# Patient Record
Sex: Male | Born: 1960 | Race: Black or African American | Hispanic: No | Smoking: Former smoker
Health system: Southern US, Community
[De-identification: ages and names within clinical notes are randomized; demographics above are authoritative.]

## PROBLEM LIST (undated history)

## (undated) DIAGNOSIS — F32A Depression, unspecified: Secondary | ICD-10-CM

## (undated) DIAGNOSIS — I2510 Atherosclerotic heart disease of native coronary artery without angina pectoris: Secondary | ICD-10-CM

## (undated) DIAGNOSIS — F329 Major depressive disorder, single episode, unspecified: Secondary | ICD-10-CM

## (undated) DIAGNOSIS — F319 Bipolar disorder, unspecified: Secondary | ICD-10-CM

## (undated) DIAGNOSIS — I639 Cerebral infarction, unspecified: Secondary | ICD-10-CM

## (undated) DIAGNOSIS — F0390 Unspecified dementia without behavioral disturbance: Secondary | ICD-10-CM

## (undated) DIAGNOSIS — I10 Essential (primary) hypertension: Secondary | ICD-10-CM

## (undated) DIAGNOSIS — T7840XA Allergy, unspecified, initial encounter: Secondary | ICD-10-CM

## (undated) DIAGNOSIS — M1990 Unspecified osteoarthritis, unspecified site: Secondary | ICD-10-CM

## (undated) DIAGNOSIS — E785 Hyperlipidemia, unspecified: Secondary | ICD-10-CM

## (undated) HISTORY — DX: Hyperlipidemia, unspecified: E78.5

## (undated) HISTORY — DX: Allergy, unspecified, initial encounter: T78.40XA

## (undated) HISTORY — DX: Major depressive disorder, single episode, unspecified: F32.9

## (undated) HISTORY — PX: HERNIA REPAIR: SHX51

## (undated) HISTORY — PX: TONSILLECTOMY: SUR1361

## (undated) HISTORY — DX: Depression, unspecified: F32.A

## (undated) HISTORY — DX: Bipolar disorder, unspecified: F31.9

---
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
## 1982-09-17 HISTORY — PX: OTHER SURGICAL HISTORY: SHX169

## 2001-12-17 ENCOUNTER — Encounter: Payer: Self-pay | Admitting: General Surgery

## 2001-12-17 ENCOUNTER — Ambulatory Visit (HOSPITAL_COMMUNITY): Admission: RE | Admit: 2001-12-17 | Discharge: 2001-12-17 | Payer: Self-pay | Admitting: General Surgery

## 2007-12-23 ENCOUNTER — Inpatient Hospital Stay (HOSPITAL_COMMUNITY): Admission: EM | Admit: 2007-12-23 | Discharge: 2007-12-24 | Payer: Self-pay | Admitting: Psychiatry

## 2007-12-23 ENCOUNTER — Ambulatory Visit: Payer: Self-pay | Admitting: Psychiatry

## 2007-12-23 ENCOUNTER — Emergency Department (HOSPITAL_COMMUNITY): Admission: EM | Admit: 2007-12-23 | Discharge: 2007-12-23 | Payer: Self-pay | Admitting: Emergency Medicine
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
## 2011-02-02 NOTE — Op Note (Signed)
[facility]  Patient:    [name] Visit Number: [id number] MRN: [id number]          Service Type: Attending:  Lorne Skeens. Hoxworth, M.D. Dictated by:   Lorne Skeens. Hoxworth, M.D. Proc. Date: 12/17/01                             Operative Report  PREOPERATIVE DIAGNOSIS:  Bilateral inguinal herniae.  POSTOPERATIVE DIAGNOSIS:  Bilateral inguinal herniae.  PROCEDURE:  Laparoscopic repair of bilateral inguinal herniae.  SURGEON:  Lorne Skeens. Hoxworth, M.D.  ANESTHESIA:  General.  BRIEF HISTORY:  [name] is a 50 year old black male who presents with longstanding gradually enlarging bilateral inguinal herniae that are symptomatic. Exam has confirmed bilateral reducible herniae. Options for repair have been discussed and we elected to proceed with laparoscopic bilateral repair. The nature of the procedure, its indications, risks of bleeding, infection, and recurrence were discussed and understood. He is now brought to the operating room for this procedure.  DESCRIPTION OF PROCEDURE:  The patient was brought to the operating room and placed in the supine position the operating room table and general endotracheal anesthesia was induced. Foley catheter was placed. PAS were placed. He was given preoperative antibiotics. The abdomen was sterilely prepped and draped. A 1-cm incision was made at the umbilicus and dissection was carried down to the midline fascia. This was incised transversely from 1 cm and the medial edge of the right rectus muscle retracted laterally, the preperitoneal space entered bluntly. The balloon dissector was passed in this space and its tip positioned at the pubis and it was inflated with good bilateral deployment of the balloons out toward the iliac crest. It was left in place for several minutes then removed and this structural balloon placed and CO2 pressure applied. Video laparoscopy showed pretty good  bilateral dissection. Dissection was initially begun on the right. The pubic symphysis was identified and the Coopers ligament cleared down to the iliac vessels, which were carefully protected. Laterally, the peritoneal edge was identified and was dissected posteriorly well out to the iliac crest. The peritoneum was then dissected posteriorly working lateral to medial and cord structures were identified and were skeletonized and isolated and the peritoneum stripped down off the cord structures. There was no indirect component. The epigastric vessels were identified and just medial to the epigastric vessels was a large direct hernia with a good deal of preperitoneal fat up into the hernia which was stripped down posteriorly and the pseudosac identified and completely cleared and the entire anterior abdominal wall cleared of preperitoneal fat and the hernia defect well delineated on all sides. This was a large direct hernia. Following the third dissection on the right side, the left side was dissected in identical fashion with identical findings. A pneumoperitoneum was noted at about this point and a small opening was found in the peritoneum on the right side in the extreme lateral area. This was closed nicely with several 5 mm clips. The pneumoperitoneum was evacuated with the Veress needle in the left upper quadrant. Following this, a Bard laparoscopic inguinal hernia mesh, size large, was placed on the left and positioned. It was tacked initially to the pubic symphysis and then along Coopers ligament down to avoiding the iliac vessels. The mesh was then tacked anteriorly along the midline and superiorly along its edge avoiding the epigastric vessels and well out laterally toward the iliac crest with all tacks being able  to be felt through the anterior abdominal wall at the tip of the applier. This provided very nice broad coverage of the direct and indirect spaces. Following this,  an identical piece of mesh, right-sided, was placed on the right and tacked in identical fashion. Both areas appeared to have nice wide coverage. CO2 was evacuated and trocars removed. The fascial defect was closed with figure-of-eight suture of 0 Vicryl. Skin incisions were closed with interrupted subcuticular of 4-0 Monocryl and Steri-Strips. Sponge, needle, and instrument counts are correct. Dry sterile dressings were applied and the patient was taken to recovery in good condition. Dictated by:   Lorne Skeens. Hoxworth, M.D. Attending:  Lorne Skeens. Hoxworth, M.D. DD:  12/17/01 TD:  12/18/01 Job: [id number]
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
## 2011-05-09 LAB — LIPID PANEL: Cholesterol: 228 mg/dL — AB (ref 0–200)

## 2011-05-09 LAB — BASIC METABOLIC PANEL
Creatinine: 1.2 mg/dL (ref 0.6–1.3)
Potassium: 4.5 mmol/L (ref 3.4–5.3)
Sodium: 137 mmol/L (ref 137–147)

## 2011-05-09 LAB — CBC AND DIFFERENTIAL
HCT: 39 % — AB (ref 41–53)
Neutrophils Absolute: 5 /uL
Platelets: 178 10*3/uL (ref 150–399)

## 2011-06-12 LAB — DIFFERENTIAL
Basophils Absolute: 0
Basophils Relative: 0
Eosinophils Absolute: 0.2
Eosinophils Relative: 2
Lymphocytes Relative: 22
Lymphs Abs: 2.2
Monocytes Absolute: 0.5
Monocytes Relative: 5
Neutro Abs: 7
Neutrophils Relative %: 71

## 2011-06-12 LAB — RAPID URINE DRUG SCREEN, HOSP PERFORMED
Amphetamines: NOT DETECTED
Barbiturates: NOT DETECTED
Benzodiazepines: NOT DETECTED
Cocaine: NOT DETECTED
Opiates: NOT DETECTED
Tetrahydrocannabinol: NOT DETECTED

## 2011-06-12 LAB — HEPATIC FUNCTION PANEL
ALT: 18
AST: 19
Albumin: 3.5
Alkaline Phosphatase: 65
Total Bilirubin: 0.6
Total Protein: 6.8

## 2011-06-12 LAB — BASIC METABOLIC PANEL
BUN: 4 — ABNORMAL LOW
CO2: 25
Calcium: 9.2
Chloride: 104
Creatinine, Ser: 0.92
GFR calc Af Amer: >60
GFR calc non Af Amer: >60
Glucose, Bld: 108 — ABNORMAL HIGH
Potassium: 3.6
Sodium: 134 — ABNORMAL LOW

## 2011-06-12 LAB — CBC
HCT: 45.9
Hemoglobin: 15.2
MCHC: 33.1
MCV: 73.8 — ABNORMAL LOW
Platelets: 220
RBC: 6.22 — ABNORMAL HIGH
RDW: 16.6 — ABNORMAL HIGH
WBC: 9.9

## 2011-06-12 LAB — LITHIUM LEVEL: Lithium Lvl: <0.25 — ABNORMAL LOW

## 2011-06-12 LAB — ETHANOL: Alcohol, Ethyl (B): <5

## 2011-10-20 LAB — BASIC METABOLIC PANEL
Glucose: 83 mg/dL
Sodium: 136 mmol/L — AB (ref 137–147)

## 2012-10-28 LAB — LIPID PANEL
Cholesterol: 240 mg/dL — AB (ref 0–200)
LDL Cholesterol: 142 mg/dL
Triglycerides: 141 mg/dL (ref 40–160)

## 2012-10-28 LAB — TSH: TSH: 2.23 u[IU]/mL (ref 0.41–5.90)

## 2012-11-11 ENCOUNTER — Encounter: Payer: Self-pay | Admitting: Internal Medicine

## 2012-11-28 ENCOUNTER — Ambulatory Visit (AMBULATORY_SURGERY_CENTER): Payer: BC Managed Care – PPO | Admitting: *Deleted

## 2012-11-28 ENCOUNTER — Encounter: Payer: Self-pay | Admitting: Internal Medicine

## 2012-11-28 VITALS — Ht 69.0 in | Wt 204.4 lb

## 2012-11-28 DIAGNOSIS — Z1211 Encounter for screening for malignant neoplasm of colon: Secondary | ICD-10-CM

## 2012-11-28 MED ORDER — MOVIPREP 100 G PO SOLR: ORAL | Status: DC

## 2012-12-19 ENCOUNTER — Encounter: Payer: Self-pay | Admitting: Internal Medicine

## 2012-12-19 ENCOUNTER — Ambulatory Visit (AMBULATORY_SURGERY_CENTER): Payer: BC Managed Care – PPO | Admitting: Internal Medicine

## 2012-12-19 VITALS — BP 146/84 | HR 50 | Temp 97.7°F | Resp 24 | Ht 69.0 in | Wt 204.0 lb

## 2012-12-19 DIAGNOSIS — Z1211 Encounter for screening for malignant neoplasm of colon: Secondary | ICD-10-CM

## 2012-12-19 MED ORDER — SODIUM CHLORIDE 0.9 % IV SOLN: 500.0000 mL | INTRAVENOUS | Status: DC

## 2012-12-19 NOTE — Patient Instructions (Addendum)

## 2012-12-19 NOTE — Progress Notes (Signed)
Report to pacu rn, vss, bbs=clear 

## 2012-12-22 ENCOUNTER — Telehealth: Payer: Self-pay | Admitting: *Deleted

## 2012-12-22 NOTE — Op Note (Signed)
[facility] [address] [place], [address]   COLONOSCOPY PROCEDURE REPORT  PATIENT: [name], [name]  MR#: [id number] BIRTHDATE: 08-19-1961 , 51  yrs. old GENDER: Male ENDOSCOPIST: Roxy Cedar, MD REFERRED BY:.  [employer] PROCEDURE DATE:  12/19/2012 PROCEDURE:   Colonoscopy, screening  .Marland Kitchen ASA CLASS:   Class II INDICATIONS:average risk screening. MEDICATIONS: MAC sedation, administered by CRNA and propofol (Diprivan) 230mg  IV  DESCRIPTION OF PROCEDURE:   After the risks benefits and alternatives of the procedure were thoroughly explained, informed consent was obtained.  A digital rectal exam revealed no abnormalities of the rectum.   The LB CF-[id number]  endoscope was introduced through the anus and advanced to the cecum, which was identified by both the appendix and ileocecal valve. No adverse events experienced.   The quality of the prep was excellent, using MoviPrep  The instrument was then slowly withdrawn as the colon was fully examined.      COLON FINDINGS: Mild diverticulosis was noted in the sigmoid colon. The colon was otherwise normal.  There was no  inflammation, polyps or cancers.  Retroflexed views revealed internal hemorrhoids. The time to cecum=3 minutes 33 seconds.  Withdrawal time=10 minutes 14 seconds.  The scope was withdrawn and the procedure completed.  COMPLICATIONS: There were no complications.  ENDOSCOPIC IMPRESSION: 1.   Mild diverticulosis was noted in the sigmoid colon 2.   The colon was otherwise normal  RECOMMENDATIONS: 1. Continue current colorectal screening recommendations for "routine risk" patients with a repeat colonoscopy in 10 years.   eSigned:  Roxy Cedar, MD 12/22/2012 11:57 AM   cc: The Patient and Robert Bellow, MD

## 2012-12-22 NOTE — Telephone Encounter (Signed)
  Follow up Call-  Call back number 12/19/2012  Post procedure Call Back phone  # [phone]  Permission to leave phone message Yes     Patient questions:  Do you have a fever, pain , or abdominal swelling? no Pain Score  0 *  Have you tolerated food without any problems? yes  Have you been able to return to your normal activities? yes  Do you have any questions about your discharge instructions: Diet   no Medications  no Follow up visit  no  Do you have questions or concerns about your Care? no  Actions: * If pain score is 4 or above: No action needed, pain <4.

## 2013-06-17 ENCOUNTER — Ambulatory Visit (INDEPENDENT_AMBULATORY_CARE_PROVIDER_SITE_OTHER): Payer: BC Managed Care – PPO | Admitting: Physician Assistant

## 2013-06-17 VITALS — BP 140/78 | HR 48 | Temp 98.0°F | Resp 16 | Ht 69.0 in | Wt 208.0 lb

## 2013-06-17 DIAGNOSIS — F319 Bipolar disorder, unspecified: Secondary | ICD-10-CM | POA: Insufficient documentation

## 2013-06-17 DIAGNOSIS — L309 Dermatitis, unspecified: Secondary | ICD-10-CM | POA: Insufficient documentation

## 2013-06-17 DIAGNOSIS — L259 Unspecified contact dermatitis, unspecified cause: Secondary | ICD-10-CM

## 2013-06-17 DIAGNOSIS — E785 Hyperlipidemia, unspecified: Secondary | ICD-10-CM | POA: Insufficient documentation

## 2013-06-17 DIAGNOSIS — F329 Major depressive disorder, single episode, unspecified: Secondary | ICD-10-CM

## 2013-06-17 DIAGNOSIS — Z23 Encounter for immunization: Secondary | ICD-10-CM

## 2013-06-17 MED ORDER — CLOBETASOL PROPIONATE 0.05 % EX OINT: TOPICAL_OINTMENT | Freq: Two times a day (BID) | CUTANEOUS | Status: DC

## 2013-06-17 MED ORDER — PREDNISONE 20 MG PO TABS: ORAL_TABLET | ORAL | Status: DC

## 2013-06-17 NOTE — Progress Notes (Signed)
  Subjective:    Patient ID: [name], male    DOB: [birth year], 52 y.o.   MRN: [id number]  HPI  [name] is a 52 year old African American male who is here for a chronic rash on feet and hands. The rash is predominately over his left foot and ankle and right lower arm. No lesions on upper legs, abdomen, chest, back, or scalp. The rash is extremely itchy and tingles/burns at time. Patient denies numbness in his extremities. Showering does not seem to make it worse. Rash is not related to temperature changes as far as the patient can tell.  No fever, chills, arthalgias, or myalgias. He has no history of asthma but does have seasonal allergies.  He has been seen at [facility] for the past year where they have tried numerous creams, including triamcinolone. These meds have worked for a little to clear up the rash but then rash and itch would return. He is currently using an OTC foot powder for the itch.  Patient also needs a TDaP for school.   Review of Systems    as above Objective:   Physical Exam  Constitutional: He appears well-developed and well-nourished. No distress.  HENT:  Head: Normocephalic and atraumatic.  Cardiovascular: Normal rate and regular rhythm.   Pulses:      Dorsalis pedis pulses are 2+ on the right side, and 2+ on the left side.       Posterior tibial pulses are 2+ on the right side, and 2+ on the left side.  Skin: Skin is dry. Rash noted. Rash is maculopapular (confluent maculopapular rash over left dorsum of the foot with lichenfication,excorations and small crust blood spots . Small round confluent rash on medial dorsum of right foot with excoriations. Area of rash on right dorsum and small patches of dry skin ). He is not diaphoretic.  No rash noted on chest, abdomen, or back. Peeling skin on plantar surface of b/l feet. Small healed scars on upper arms b/l along with discrete maculopapular rashes on lower right forearm.   Psychiatric: He has a  normal mood and affect.    BP 140/78  Pulse 48  Temp(Src) 98 F (36.7 C) (Oral)  Resp 16  Ht 5\' 9"  (1.753 m)  Wt 208 lb (94.348 kg)  BMI 30.7 kg/m2  SpO2 98%     Results for orders placed in visit on 06/17/13  GLUCOSE, POCT (MANUAL RESULT ENTRY)      Result Value Range   POC Glucose 87  70 - 99 mg/dl    Assessment & Plan:  Eczema - Plan: POCT glucose (manual entry), predniSONE (DELTASONE) 20 MG tablet, clobetasol ointment (TEMOVATE) 0.05 %  Need for Tdap vaccination - Plan: Tdap vaccine greater than or equal to 7yo IM  Follow instructions for dry skin.

## 2013-06-17 NOTE — Progress Notes (Signed)
I have examined this patient along with the student and agree.  Counseled on hygiene for dry skin, and that treatment of chronic eczema is vigilant maintenance rather than curative.  RTC 4 weeks to reassess progress.

## 2013-06-17 NOTE — Patient Instructions (Addendum)
DRY SKIN CARE: Drink at least 64 ounces of water daily. Consider a humidifier for the room where you sleep. Bathe once daily. Avoid using HOT water, as it dries skin. Avoid deodorant soaps (Dial is the worst!) and stick with gentle cleansers (I like Cetaphil Liquid Cleanser). After bathing, dry off completely, then apply a thick emollient cream (I like Cetaphil Moisturizing Cream for the body, and Dermal Therapy for the hands). Apply the cream twice daily, or more! Use the Dermal Therapy on your hands after every time you wash them!  An over-the-counter antihistamine, like Claritin, Zyrtec or Allegra, can also help reduce the itching and skin changes.

## 2013-08-03 ENCOUNTER — Ambulatory Visit (INDEPENDENT_AMBULATORY_CARE_PROVIDER_SITE_OTHER): Payer: BC Managed Care – PPO | Admitting: Physician Assistant

## 2013-08-03 ENCOUNTER — Encounter: Payer: Self-pay | Admitting: Physician Assistant

## 2013-08-03 VITALS — BP 122/78 | HR 62 | Temp 98.1°F | Resp 18 | Ht 67.75 in | Wt 213.6 lb

## 2013-08-03 DIAGNOSIS — Z23 Encounter for immunization: Secondary | ICD-10-CM

## 2013-08-03 DIAGNOSIS — L309 Dermatitis, unspecified: Secondary | ICD-10-CM

## 2013-08-03 DIAGNOSIS — K649 Unspecified hemorrhoids: Secondary | ICD-10-CM

## 2013-08-03 DIAGNOSIS — F32A Depression, unspecified: Secondary | ICD-10-CM

## 2013-08-03 DIAGNOSIS — E785 Hyperlipidemia, unspecified: Secondary | ICD-10-CM

## 2013-08-03 DIAGNOSIS — L259 Unspecified contact dermatitis, unspecified cause: Secondary | ICD-10-CM

## 2013-08-03 DIAGNOSIS — F329 Major depressive disorder, single episode, unspecified: Secondary | ICD-10-CM

## 2013-08-03 NOTE — Progress Notes (Signed)
    Subjective:    Patient ID: [name], male    DOB: 10-Dec-1960, 52 y.o.   MRN: [id number]  HPI This 52 y.o. male presents for evaluation of eczema after treatment with a prednisone taper followed by topical steroid and good skin hygiene. He reports that his hands and feet "have totally cleared up."   Additionally, he needs a Td to stay in school.  At his visit on 06/17/2013 he advised [place] he needed documentation of 3 tetanus vaccines to stay in school at [employer].  We administered a Tdap that day, and gave instructions to return in 4 weeks and 6 months for Td. Today he shows me documentation of a Tdap he received at the student health center on 06/11/2013.   He also needs a referral to general surgery for definitive treatment of long-standing hemorrhoids. Has had them for years, but now having more problems with pain during BMs.   Medications, allergies, past medical history, surgical history, family history, social history and problem list reviewed.   Review of Systems As above.    Objective:   Physical Exam  Vitals reviewed. Constitutional: He is oriented to person, place, and time. Vital signs are normal. He appears well-developed and well-nourished. He is active and cooperative. No distress.  HENT:  Head: Normocephalic and atraumatic.  Right Ear: Hearing normal.  Left Ear: Hearing normal.  Eyes: EOM are normal. Pupils are equal, round, and reactive to light.  Cardiovascular: Normal rate and regular rhythm.   Pulses:      Radial pulses are 2+ on the right side, and 2+ on the left side.  Pulmonary/Chest: Effort normal.  Lymphadenopathy:       Head (right side): No tonsillar, no preauricular, no posterior auricular and no occipital adenopathy present.       Head (left side): No tonsillar, no preauricular, no posterior auricular and no occipital adenopathy present.    He has no cervical adenopathy.       Right: No supraclavicular adenopathy present.       Left: No  supraclavicular adenopathy present.  Neurological: He is alert and oriented to person, place, and time. No sensory deficit.  Skin: Skin is warm, dry and intact. No rash noted. No cyanosis or erythema. Nails show no clubbing.  Hyperpigmented, moderately hypertrophic plaques on the dorsum of both hands and feet are significantly improved from his previous visit.  Psychiatric: He has a normal mood and affect.          Assessment & Plan:  Need for TD vaccine - Plan: Td vaccine greater than or equal to 7yo preservative free IM. He'll need to RTC for another TD dose in 6 months.  Unfortunately, the two Tdap doses so close together do not afford the needed immunity.  Hemorrhoids - Plan: Ambulatory referral to General Surgery  Eczema - continue current maintenance treatment.  Fernande Bras, PA-C Physician Assistant-Certified [facility]

## 2013-08-03 NOTE — Patient Instructions (Addendum)
Keep up the great work with your skin!  Unfortunately, the 2 Tdap vaccines so close together do no provide you with the immunity you need for the long term.  You need to return in 6 months for one more Td dose.Tetanus, Diphtheria (Td) Vaccine What You Need to Know WHY GET VACCINATED? Tetanus  and diphtheria are very serious diseases. They are rare in the [place] today, but people who do become infected often have severe complications. Td vaccine is used to protect adolescents and adults from both of these diseases. Both tetanus and diphtheria are infections caused by bacteria. Diphtheria spreads from person to person through coughing or sneezing. Tetanus-causing bacteria enter the body through cuts, scratches, or wounds. TETANUS (Lockjaw) causes painful muscle tightening and stiffness, usually all over the body.  It can lead to tightening of muscles in the head and neck so you can't open your mouth, swallow, or sometimes even breathe. Tetanus kills about 1 out of every 5 people who are infected. DIPHTHERIA can cause a thick coating to form in the back of the throat.  It can lead to breathing problems, paralysis, heart failure, and death. Before vaccines, the [place] saw as many as 200,000 cases a year of diphtheria and hundreds of cases of tetanus. Since vaccination began, cases of both diseases have dropped by about 99%. TD VACCINE Td vaccine can protect adolescents and adults from tetanus and diphtheria. Td is usually given as a booster dose every 10 years but it can also be given earlier after a severe and dirty wound or burn. Your doctor can give you more information. Td may safely be given at the same time as other vaccines. SOME PEOPLE SHOULD NOT GET THIS VACCINE  If you ever had a life-threatening allergic reaction after a dose of any tetanus or diphtheria containing vaccine, OR if you have a severe allergy to any part of this vaccine, you should not get Td. Tell your doctor if  you have any severe allergies.  Talk to your doctor if you:  have epilepsy or another nervous system problem,  had severe pain or swelling after any vaccine containing diphtheria or tetanus,  ever had Guillain Barr Syndrome (GBS),  aren't feeling well on the day the shot is scheduled. RISKS OF A VACCINE REACTION With a vaccine, like any medicine, there is a chance of side effects. These are usually mild and go away on their own. Serious side effects are also possible, but are very rare. Most people who get Td vaccine do not have any problems with it. Mild Problems  following Td (Did not interfere with activities)  Pain where the shot was given (about 8 people in 10)  Redness or swelling where the shot was given (about 1 person in 3)  Mild fever (about 1 person in 15)  Headache or Tiredness (uncommon) Moderate Problems following Td (Interfered with activities, but did not require medical attention)  Fever over 102 F (38.9 C) (rare) Severe Problems  following Td (Unable to perform usual activities; required medical attention)  Swelling, severe pain, bleeding, or redness in the arm where the shot was given (rare). Problems that could happen after any vaccine:  Brief fainting spells can happen after any medical procedure, including vaccination. Sitting or lying down for about 15 minutes can help prevent fainting, and injuries caused by a fall. Tell your doctor if you feel dizzy, or have vision changes or ringing in the ears.  Severe shoulder pain and reduced range of  motion in the arm where a shot was given can happen, very rarely, after a vaccination.  Severe allergic reactions from a vaccine are very rare, estimated at less than 1 in a million doses. If one were to occur, it would usually be within a few minutes to a few hours after the vaccination. WHAT IF THERE IS A SERIOUS REACTION? What should I look for?  Look for anything that concerns you, such as signs of a severe  allergic reaction, very high fever, or behavior changes. Signs of a severe allergic reaction can include hives, swelling of the face and throat, difficulty breathing, a fast heartbeat, dizziness, and weakness. These would usually start a few minutes to a few hours after the vaccination. What should I do?  If you think it is a severe allergic reaction or other emergency that can't wait, call 911 or get the person to the nearest hospital. Otherwise, call your doctor.  Afterward, the reaction should be reported to the Vaccine Adverse Event Reporting System (VAERS). Your doctor might file this report, or, you can do it yourself through the VAERS website or by calling 1-314-773-5805. VAERS is only for reporting reactions. They do not give medical advice. THE NATIONAL VACCINE INJURY COMPENSATION PROGRAM The National Vaccine Injury Compensation Program (VICP) is a federal program that was created to compensate people who may have been injured by certain vaccines. Persons who believe they may have been injured by a vaccine can learn about the program and about filing a claim by calling 1-610-231-4490 or visiting the [facility] website. HOW CAN I LEARN MORE?  Ask your doctor.  Contact your local or state health department.  Contact the Centers for Disease Control and Prevention (CDC):  Call (743) 502-0586 (1-800-CDC-INFO)  Visit CDC's vaccines website CDC Td Vaccine Interim VIS (10/21/12) Document Released: 07/01/2006 Document Revised: 12/29/2012 Document Reviewed: 12/24/2012 [facility] Patient Information 2014 [place], [place].

## 2013-08-11 ENCOUNTER — Ambulatory Visit (INDEPENDENT_AMBULATORY_CARE_PROVIDER_SITE_OTHER): Payer: BC Managed Care – PPO | Admitting: Surgery

## 2013-08-20 ENCOUNTER — Ambulatory Visit (INDEPENDENT_AMBULATORY_CARE_PROVIDER_SITE_OTHER): Payer: BC Managed Care – PPO | Admitting: Surgery

## 2013-08-20 ENCOUNTER — Encounter (INDEPENDENT_AMBULATORY_CARE_PROVIDER_SITE_OTHER): Payer: Self-pay

## 2013-08-20 ENCOUNTER — Encounter (INDEPENDENT_AMBULATORY_CARE_PROVIDER_SITE_OTHER): Payer: Self-pay | Admitting: Surgery

## 2013-08-20 VITALS — BP 144/84 | HR 68 | Temp 98.1°F | Resp 18 | Ht 69.0 in | Wt 208.0 lb

## 2013-08-20 DIAGNOSIS — K644 Residual hemorrhoidal skin tags: Secondary | ICD-10-CM

## 2013-08-20 MED ORDER — HYDROCORTISONE ACETATE 25 MG RE SUPP: 25.0000 mg | Freq: Two times a day (BID) | RECTAL | Status: DC
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
## 2013-09-22 ENCOUNTER — Ambulatory Visit (INDEPENDENT_AMBULATORY_CARE_PROVIDER_SITE_OTHER): Payer: BC Managed Care – PPO | Admitting: Surgery

## 2013-09-22 ENCOUNTER — Encounter (INDEPENDENT_AMBULATORY_CARE_PROVIDER_SITE_OTHER): Payer: Self-pay | Admitting: Surgery

## 2013-09-22 VITALS — BP 126/74 | HR 77 | Temp 96.7°F | Resp 14 | Ht 69.0 in | Wt 217.4 lb

## 2013-09-22 DIAGNOSIS — K644 Residual hemorrhoidal skin tags: Secondary | ICD-10-CM

## 2013-09-22 MED ORDER — DOXYCYCLINE HYCLATE 100 MG PO TABS: 100.0000 mg | ORAL_TABLET | Freq: Two times a day (BID) | ORAL | Status: DC

## 2013-09-22 MED ORDER — HYDROCORTISONE ACETATE 25 MG RE SUPP: 25.0000 mg | Freq: Two times a day (BID) | RECTAL | Status: DC

## 2013-09-22 NOTE — Progress Notes (Signed)
Subjective:     Patient ID: [name], male   DOB: 09-Apr-1961, 53 y.o.   MRN: [id number]  HPI He is here for a followup visit regarding his hemorrhoids. He reports significant relief from his discomfort. He reports a swelling has completely resolved. He did have an area opened up and drained purulence. He has had a previous fistula repair in the past  Review of Systems     Objective:   Physical Exam On exam, his hemorrhoids have totally resolved. 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 I will continue more steroid suppositories and antibiotics. He'll come back and see me should He decides he wants surgery

## 2013-10-01 ENCOUNTER — Telehealth: Payer: Self-pay

## 2013-10-01 NOTE — Telephone Encounter (Signed)
LM for rtn call- Need pt to come in for an ov. We are not the original prescribing provider.

## 2013-10-01 NOTE — Telephone Encounter (Addendum)
Patient states that he needs a refill on Pravastatin 40mg  [facility] [place].   [phone]

## 2013-10-01 NOTE — Telephone Encounter (Deleted)
LM for rtn call- Sent in 30 day supply of Pravachol. Needs ov for further refills

## 2013-10-02 ENCOUNTER — Other Ambulatory Visit: Payer: Self-pay

## 2013-10-02 DIAGNOSIS — L309 Dermatitis, unspecified: Secondary | ICD-10-CM

## 2013-10-02 MED ORDER — CLOBETASOL PROPIONATE 0.05 % EX OINT: TOPICAL_OINTMENT | Freq: Two times a day (BID) | CUTANEOUS | Status: DC

## 2013-10-02 MED ORDER — PRAVASTATIN SODIUM 40 MG PO TABS: 40.0000 mg | ORAL_TABLET | Freq: Every day | ORAL | Status: DC

## 2013-10-07 ENCOUNTER — Telehealth: Payer: Self-pay

## 2013-10-07 NOTE — Telephone Encounter (Signed)
This was the exact cream we gave him in Oct 2014.  Did his insurance change (? Maybe he does not have insurance now)?  Did he ask for brand?

## 2013-10-07 NOTE — Telephone Encounter (Signed)
PT STATES THE MEDICINE THAT WAS CALLED IN FOR HIM COST OVER $400.00 AND WOULD LIKE SOMEONE TO CALL HIM SOMETHING ELSE IN CHEAPER PLEASE CALL PT AT [phone]     [facility] [place]

## 2013-10-07 NOTE — Telephone Encounter (Signed)
Spoke with pt, the cream was over 400 dollars, Clobetasol.

## 2013-10-08 MED ORDER — BETAMETHASONE DIPROPIONATE AUG 0.05 % EX OINT: TOPICAL_OINTMENT | Freq: Two times a day (BID) | CUTANEOUS | Status: DC

## 2013-10-08 NOTE — Telephone Encounter (Signed)
Spoke with pt, his insurance has changed. He has a deductible now and he does remember paying 22 dollars for it last time. He called his insurance to verify and they agreed. He will most likely have to pay for this out of pocket. Im not sure if there is anything else cheaper. Please advise.

## 2013-10-08 NOTE — Telephone Encounter (Signed)
I have sent another one to the pharmacy to see if that might be cheaper.

## 2013-10-08 NOTE — Telephone Encounter (Signed)
Called pt, LMOM to CB with answers to questions.

## 2013-10-09 NOTE — Telephone Encounter (Signed)
Left message to return call 

## 2013-10-12 ENCOUNTER — Telehealth: Payer: Self-pay

## 2013-10-12 NOTE — Telephone Encounter (Signed)
PT WAS PRESCRIBED augmented betamethasone dipropionate (DIPROLENE) 0.05 % ointment, [facility] [place] states that they only have this rx in a cream. Please call her to authorize this change. Best# [phone] Carollee Herter(Shannon)

## 2013-10-12 NOTE — Telephone Encounter (Signed)
lmom that we called in diprolene for him and if this is not cheaper to let us know.

## 2013-10-13 NOTE — Telephone Encounter (Signed)
Call pt and make sure he is ok using the cream vs ointment.  Ok to change if pt is ok with it.  If pt prefers ointment, will have to have pharmacy transfer rx to someplace that has ointment in stock

## 2013-10-13 NOTE — Telephone Encounter (Signed)
Called pt, pt ok with cream. [facility] and gave an ok to switch.

## 2013-12-27 ENCOUNTER — Other Ambulatory Visit: Payer: Self-pay | Admitting: Physician Assistant

## 2013-12-30 ENCOUNTER — Ambulatory Visit: Payer: BC Managed Care – PPO | Admitting: Emergency Medicine

## 2013-12-30 ENCOUNTER — Other Ambulatory Visit: Payer: Self-pay | Admitting: Emergency Medicine

## 2013-12-30 VITALS — BP 142/92 | HR 60 | Temp 97.8°F | Resp 16 | Ht 67.5 in | Wt 220.0 lb

## 2013-12-30 DIAGNOSIS — R21 Rash and other nonspecific skin eruption: Secondary | ICD-10-CM

## 2013-12-30 DIAGNOSIS — Z79899 Other long term (current) drug therapy: Secondary | ICD-10-CM

## 2013-12-30 DIAGNOSIS — E782 Mixed hyperlipidemia: Secondary | ICD-10-CM

## 2013-12-30 DIAGNOSIS — F319 Bipolar disorder, unspecified: Secondary | ICD-10-CM

## 2013-12-30 MED ORDER — PRAVASTATIN SODIUM 40 MG PO TABS: 40.0000 mg | ORAL_TABLET | Freq: Every day | ORAL | Status: DC
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
## 2013-12-30 NOTE — Patient Instructions (Signed)
Lipid Profile The lipid profile is a group of tests that are often ordered together to determine risk of coronary heart disease. The tests that make up a lipid profile are tests that have been shown to be good indicators of whether someone is likely to have a heart attack or stroke caused by blockage of blood vessels (hardening of the arteries).  The lipid profile includes total cholesterol, HDL-cholesterol (often called good cholesterol), LDL-cholesterol (often called bad cholesterol), and triglycerides. Sometimes the report will include additional calculated values such as HDL/Cholesterol ratio or a risk score based on lipid profile results, age, sex, and other risk factors.  Treatment is based on your overall risk of coronary heart disease. A target LDL is identified. If your LDL is above the target value, you will be treated. Your target LDL value is:   LDL less than 100 mg/dL (4.092.59 mmol/L) if you have heart disease or diabetes.  LDL less than 130 mg/dL (8.113.37 mmol/L) if you have 2 or more risk factors.  LDL less than 160 mg/dL (9.144.14 mmol/L) if you have 0 or 1 risk factor. Ranges for normal findings may vary among different laboratories and hospitals. You should always check with your doctor after having lab work or other tests done to discuss the meaning of your test results and whether your values are considered within normal limits. MEANING OF TEST  Your caregiver will go over the test results with you and discuss the importance and meaning of your results, as well as treatment options and the need for additional tests if necessary. OBTAINING THE TEST RESULTS It is your responsibility to obtain your test results. Ask the lab or department performing the test when and how you will get your results. Document Released: 10/06/2004 Document Revised: 11/26/2011 Document Reviewed: 08/14/2008 [facility] Patient Information 2014 [place]

## 2013-12-31 LAB — COMPREHENSIVE METABOLIC PANEL
ALBUMIN: 4.4 g/dL (ref 3.5–5.2)
ALT: 17 U/L (ref 0–53)
AST: 21 U/L (ref 0–37)
Alkaline Phosphatase: 70 U/L (ref 39–117)
BUN: 8 mg/dL (ref 6–23)
CALCIUM: 9.4 mg/dL (ref 8.4–10.5)
CO2: 20 meq/L (ref 19–32)
CREATININE: 1.1 mg/dL (ref 0.50–1.35)
Chloride: 101 mEq/L (ref 96–112)
Glucose, Bld: 85 mg/dL (ref 70–99)
POTASSIUM: 4.3 meq/L (ref 3.5–5.3)
SODIUM: 136 meq/L (ref 135–145)
TOTAL PROTEIN: 7.3 g/dL (ref 6.0–8.3)
Total Bilirubin: 0.4 mg/dL (ref 0.2–1.2)

## 2013-12-31 LAB — T4, FREE: Free T4: 1.21 ng/dL (ref 0.80–1.80)

## 2013-12-31 LAB — T3 UPTAKE: T3 Uptake: 29.2 % (ref 22.5–37.0)

## 2013-12-31 LAB — LIPID PANEL
CHOL/HDL RATIO: 4.2 ratio
CHOLESTEROL: 229 mg/dL — AB (ref 0–200)
HDL: 54 mg/dL (ref >39)
LDL Cholesterol: 138 mg/dL — ABNORMAL HIGH (ref 0–99)
Triglycerides: 183 mg/dL — ABNORMAL HIGH (ref <150)
VLDL: 37 mg/dL (ref 0–40)

## 2013-12-31 LAB — LITHIUM LEVEL: Lithium Lvl: 0.5 mEq/L — ABNORMAL LOW (ref 0.80–1.40)

## 2013-12-31 LAB — TSH: TSH: 1.808 u[IU]/mL (ref 0.350–4.500)

## 2014-01-21 ENCOUNTER — Ambulatory Visit (INDEPENDENT_AMBULATORY_CARE_PROVIDER_SITE_OTHER): Payer: BC Managed Care – PPO | Admitting: Family Medicine

## 2014-01-21 ENCOUNTER — Telehealth: Payer: Self-pay | Admitting: *Deleted

## 2014-01-21 VITALS — BP 140/96 | HR 55 | Temp 97.3°F | Resp 14 | Ht 67.5 in | Wt 217.2 lb

## 2014-01-21 DIAGNOSIS — E785 Hyperlipidemia, unspecified: Secondary | ICD-10-CM

## 2014-01-21 MED ORDER — PRAVASTATIN SODIUM 80 MG PO TABS: 80.0000 mg | ORAL_TABLET | Freq: Every day | ORAL | Status: DC

## 2014-01-21 NOTE — Telephone Encounter (Signed)
Faxed prescription for pravastatin 80 mg to [facility] at [phone] on [facility] fax form and patient gave a number [phone]. Also, I faxed the form patient gave to provider to complete about his lab results. Confirmation page received, form faxed to [facility] [phone]).
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
## 2014-01-21 NOTE — Patient Instructions (Signed)
Hypercholesterolemia High Blood Cholesterol Cholesterol is a white, waxy, fat-like protein needed by your body in small amounts. The liver makes all the cholesterol you need. It is carried from the liver by the blood through the blood vessels. Deposits (plaque) may build up on blood vessel walls. This makes the arteries narrower and stiffer. Plaque increases the risk for heart attack and stroke. You cannot feel your cholesterol level even if it is very high. The only way to know is by a blood test to check your lipid (fats) levels. Once you know your cholesterol levels, you should keep a record of the test results. Work with your caregiver to to keep your levels in the desired range. WHAT THE RESULTS MEAN:  Total cholesterol is a rough measure of all the cholesterol in your blood.  LDL is the so-called bad cholesterol. This is the type that deposits cholesterol in the walls of the arteries. You want this level to be low.  HDL is the good cholesterol because it cleans the arteries and carries the LDL away. You want this level to be high.  Triglycerides are fat that the body can either burn for energy or store. High levels are closely linked to heart disease. DESIRED LEVELS:  Total cholesterol below 200.  LDL below 100 for people at risk, below 70 for very high risk.  HDL above 50 is good, above 60 is best.  Triglycerides below 150. HOW TO LOWER YOUR CHOLESTEROL:  Diet.  Choose fish or white meat chicken and turkey, roasted or baked. Limit fatty cuts of red meat, fried foods, and processed meats, such as sausage and lunch meat.  Eat lots of fresh fruits and vegetables. Choose whole grains, beans, pasta, potatoes and cereals.  Use only small amounts of olive, corn or canola oils. Avoid butter, mayonnaise, shortening or palm kernel oils. Avoid foods with trans-fats.  Use skim/nonfat milk and low-fat/nonfat yogurt and cheeses. Avoid whole milk, cream, ice cream, egg yolks and cheeses.  Healthy desserts include angel food cake, gingersnaps, animal crackers, hard candy, popsicles, and low-fat/nonfat frozen yogurt. Avoid pastries, cakes, pies and cookies.  Exercise.  A regular program helps decrease LDL and raises HDL.  Helps with weight control.  Do things that increase your activity level like gardening, walking, or taking the stairs.  Medication.  May be prescribed by your caregiver to help lowering cholesterol and the risk for heart disease.  You may need medicine even if your levels are normal if you have several risk factors. HOME CARE INSTRUCTIONS   Follow your diet and exercise programs as suggested by your caregiver.  Take medications as directed.  Have blood work done when your caregiver feels it is necessary. MAKE SURE YOU:   Understand these instructions.  Will watch your condition.  Will get help right away if you are not doing well or get worse. Document Released: 09/03/2005 Document Revised: 11/26/2011 Document Reviewed: 02/19/2007 Exit[employer] Patient Information 2014 [employer].  

## 2014-02-16 ENCOUNTER — Ambulatory Visit (INDEPENDENT_AMBULATORY_CARE_PROVIDER_SITE_OTHER): Payer: BC Managed Care – PPO | Admitting: Family Medicine

## 2014-02-16 ENCOUNTER — Encounter: Payer: Self-pay | Admitting: Family Medicine

## 2014-02-16 VITALS — BP 137/81 | HR 60 | Temp 98.0°F | Resp 16 | Ht 68.0 in | Wt 218.0 lb

## 2014-02-16 DIAGNOSIS — E785 Hyperlipidemia, unspecified: Secondary | ICD-10-CM

## 2014-02-16 LAB — LIPID PANEL
CHOL/HDL RATIO: 4.4 ratio
Cholesterol: 203 mg/dL — ABNORMAL HIGH (ref 0–200)
HDL: 46 mg/dL (ref >39)
LDL Cholesterol: 103 mg/dL — ABNORMAL HIGH (ref 0–99)
Triglycerides: 270 mg/dL — ABNORMAL HIGH (ref <150)
VLDL: 54 mg/dL — ABNORMAL HIGH (ref 0–40)

## 2014-02-16 LAB — ALT: ALT: 19 U/L (ref 0–53)

## 2014-02-16 NOTE — Progress Notes (Signed)
S:  This 53 y.o. AA male has Dyslipidemia, currently treated w/ Pravastatin. His dose was increased to 80 mg < 4 weeks ago. He is here for lipid labs. He also takes Lithium and reports dose has been unchanged for > 5 years. His last set of labs were sent to his psychiatrist in [place], [place]. He reports no myalgias or back pain with increased statin dose.  Patient Active Problem List   Diagnosis Date Noted  . External hemorrhoid, bleeding 08/20/2013  . Bipolar 1 disorder 06/17/2013  . Hyperlipidemia 06/17/2013  . Eczema 06/17/2013   Prior to Admission medications   Medication Sig Start Date End Date Taking? Authorizing Provider  doxycycline (DORYX) 100 MG DR capsule Take 100 mg by mouth 2 (two) times daily.   Yes Historical Provider, MD  lithium carbonate 300 MG capsule Take 300 mg by mouth 2 (two) times daily with a meal.   Yes Historical Provider, MD  pravastatin (PRAVACHOL) 80 MG tablet Take 1 tablet (80 mg total) by mouth daily. [facility] [phone] 01/21/14  Yes Thao P Le, DO  traZODone (DESYREL) 100 MG tablet Take 100 mg by mouth at bedtime.   Yes Historical Provider, MD   PMHx, Surg Hx, Soc and Fam Hx reviewed.  O: Filed Vitals:   02/16/14 1451  BP: 137/81  Pulse: 60  Temp: 98 F (36.7 C)  Resp: 16   GEN: In NAD; WN,WD. HENT: [place]/AT; EOMI w/ clear conj/sclerae. Otherwise unremarkable. COR: RRR. LUNGS: Unlabored resp. SKIN: W&D; intact w/o erythema or lesions. NEURO: A&O x 3; CNs intact. Nonfocal. PSYCH: Blunt affect.  A/P: Hyperlipidemia - Continue Pravastatin 80 mg pending labs. Plan: ALT, Lipid panel  Schedule CPE/ fastng labs.

## 2014-05-11 ENCOUNTER — Ambulatory Visit (INDEPENDENT_AMBULATORY_CARE_PROVIDER_SITE_OTHER): Payer: BC Managed Care – PPO | Admitting: Surgery

## 2014-05-11 VITALS — BP 156/78 | HR 61 | Temp 98.1°F | Ht 69.0 in | Wt 212.1 lb

## 2014-05-11 DIAGNOSIS — K603 Anal fistula: Secondary | ICD-10-CM

## 2014-05-11 MED ORDER — AMOXICILLIN-POT CLAVULANATE 875-125 MG PO TABS: 1.0000 | ORAL_TABLET | Freq: Two times a day (BID) | ORAL | Status: AC

## 2014-05-11 MED ORDER — HYDROCODONE-ACETAMINOPHEN 5-325 MG PO TABS: 1.0000 | ORAL_TABLET | ORAL | Status: DC | PRN

## 2014-05-11 NOTE — Progress Notes (Signed)
Subjective:     Patient ID: [name], male   DOB: 18-Dec-1960, 53 y.o.   MRN: [id number]  HPI He is here for long-term followup visit. He again is having significant perineal discomfort and occasional drainage.  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 I will be scheduling him for a hemorrhoidectomy as well as possible fistulotomy versus abscess drainage

## 2014-05-11 NOTE — Patient Instructions (Signed)
Our schedules will call you back to schedule surgery. 

## 2014-08-02 ENCOUNTER — Other Ambulatory Visit (INDEPENDENT_AMBULATORY_CARE_PROVIDER_SITE_OTHER): Payer: Self-pay | Admitting: Surgery

## 2014-08-02 MED ORDER — AMOXICILLIN-POT CLAVULANATE 875-125 MG PO TABS: 1.0000 | ORAL_TABLET | Freq: Two times a day (BID) | ORAL | Status: AC

## 2014-08-17 ENCOUNTER — Other Ambulatory Visit (INDEPENDENT_AMBULATORY_CARE_PROVIDER_SITE_OTHER): Payer: Self-pay | Admitting: Surgery

## 2014-08-17 HISTORY — PX: HEMORRHOID SURGERY: SHX153

## 2014-08-18 ENCOUNTER — Ambulatory Visit (INDEPENDENT_AMBULATORY_CARE_PROVIDER_SITE_OTHER): Payer: BC Managed Care – PPO | Admitting: Family Medicine

## 2014-08-18 ENCOUNTER — Encounter: Payer: Self-pay | Admitting: Family Medicine

## 2014-08-18 VITALS — BP 122/82 | HR 56 | Temp 97.8°F | Resp 16 | Ht 67.5 in | Wt 205.0 lb

## 2014-08-18 DIAGNOSIS — F319 Bipolar disorder, unspecified: Secondary | ICD-10-CM

## 2014-08-18 DIAGNOSIS — Z23 Encounter for immunization: Secondary | ICD-10-CM

## 2014-08-18 DIAGNOSIS — Z79899 Other long term (current) drug therapy: Secondary | ICD-10-CM

## 2014-08-18 DIAGNOSIS — Z0000 Encounter for general adult medical examination without abnormal findings: Secondary | ICD-10-CM

## 2014-08-18 DIAGNOSIS — N400 Enlarged prostate without lower urinary tract symptoms: Secondary | ICD-10-CM

## 2014-08-18 DIAGNOSIS — E785 Hyperlipidemia, unspecified: Secondary | ICD-10-CM

## 2014-08-18 LAB — LITHIUM LEVEL: Lithium Lvl: 0.6 mEq/L — ABNORMAL LOW (ref 0.80–1.40)

## 2014-08-18 LAB — POCT URINALYSIS DIPSTICK
Bilirubin, UA: NEGATIVE
Blood, UA: NEGATIVE
Glucose, UA: NEGATIVE
Ketones, UA: NEGATIVE
Leukocytes, UA: NEGATIVE
NITRITE UA: NEGATIVE
PH UA: 6
PROTEIN UA: NEGATIVE
Spec Grav, UA: <=1.005
UROBILINOGEN UA: 0.2

## 2014-08-18 LAB — COMPLETE METABOLIC PANEL WITH GFR
ALBUMIN: 4.2 g/dL (ref 3.5–5.2)
ALK PHOS: 75 U/L (ref 39–117)
ALT: 15 U/L (ref 0–53)
AST: 19 U/L (ref 0–37)
BUN: 9 mg/dL (ref 6–23)
CALCIUM: 9.4 mg/dL (ref 8.4–10.5)
CO2: 25 mEq/L (ref 19–32)
Chloride: 101 mEq/L (ref 96–112)
Creat: 1.06 mg/dL (ref 0.50–1.35)
GFR, EST NON AFRICAN AMERICAN: 79 mL/min
GFR, Est African American: >89 mL/min
Glucose, Bld: 91 mg/dL (ref 70–99)
POTASSIUM: 4.5 meq/L (ref 3.5–5.3)
Sodium: 138 mEq/L (ref 135–145)
Total Bilirubin: 0.4 mg/dL (ref 0.2–1.2)
Total Protein: 7.2 g/dL (ref 6.0–8.3)

## 2014-08-18 LAB — LIPID PANEL
CHOL/HDL RATIO: 4.2 ratio
CHOLESTEROL: 208 mg/dL — AB (ref 0–200)
HDL: 50 mg/dL (ref >39)
LDL Cholesterol: 134 mg/dL — ABNORMAL HIGH (ref 0–99)
Triglycerides: 122 mg/dL (ref <150)
VLDL: 24 mg/dL (ref 0–40)

## 2014-08-18 LAB — CBC WITH DIFFERENTIAL/PLATELET
Basophils Absolute: 0.1 10*3/uL (ref 0.0–0.1)
Basophils Relative: 1 % (ref 0–1)
Eosinophils Absolute: 0.4 10*3/uL (ref 0.0–0.7)
Eosinophils Relative: 7 % — ABNORMAL HIGH (ref 0–5)
HCT: 46.1 % (ref 39.0–52.0)
Hemoglobin: 14.5 g/dL (ref 13.0–17.0)
Lymphocytes Relative: 28 % (ref 12–46)
Lymphs Abs: 1.4 10*3/uL (ref 0.7–4.0)
MCH: 23.4 pg — AB (ref 26.0–34.0)
MCHC: 31.5 g/dL (ref 30.0–36.0)
MCV: 74.5 fL — AB (ref 78.0–100.0)
MPV: 9.9 fL (ref 9.4–12.4)
Monocytes Absolute: 0.4 10*3/uL (ref 0.1–1.0)
Monocytes Relative: 7 % (ref 3–12)
NEUTROS ABS: 2.9 10*3/uL (ref 1.7–7.7)
Neutrophils Relative %: 57 % (ref 43–77)
Platelets: 192 10*3/uL (ref 150–400)
RBC: 6.19 MIL/uL — ABNORMAL HIGH (ref 4.22–5.81)
RDW: 15.5 % (ref 11.5–15.5)
WBC: 5 10*3/uL (ref 4.0–10.5)

## 2014-08-18 LAB — THYROID PANEL WITH TSH
Free Thyroxine Index: 1.8 (ref 1.4–3.8)
T3 UPTAKE: 27 % (ref 22–35)
T4 TOTAL: 6.6 ug/dL (ref 4.5–12.0)
TSH: 0.719 u[IU]/mL (ref 0.350–4.500)

## 2014-08-18 NOTE — Patient Instructions (Addendum)
Keeping you healthy  Get these tests  Blood pressure- Have your blood pressure checked once a year by your healthcare provider.  Normal blood pressure is 120/80  Weight- Have your body mass index (BMI) calculated to screen for obesity.  BMI is a measure of body fat based on height and weight. You can also calculate your own BMI at [email or web address]/.  Cholesterol- Have your cholesterol checked every year.  Diabetes- Have your blood sugar checked regularly if you have high blood pressure, high cholesterol, have a family history of diabetes or if you are overweight.  Screening for Colon Cancer- Colonoscopy starting at age 54.  Screening may begin sooner depending on your family history and other health conditions. Follow up colonoscopy as directed by your Gastroenterologist.  Screening for Prostate Cancer- Both blood work (PSA) and a rectal exam help screen for Prostate Cancer.  Screening begins at age 54 with African-American men and at age 54 with Caucasian men.  Screening may begin sooner depending on your family history.  Take these medicines  Aspirin- One aspirin daily can help prevent Heart disease and Stroke.  Flu shot- Every fall. You received Flu vaccine today.  Tetanus- Every 10 years.  Zostavax- Once after the age of 54 to prevent Shingles.  Pneumonia shot- Once after the age of 54; if you are younger than 4765, ask your healthcare provider if you need a Pneumonia shot.  Take these steps  Don't smoke- If you do smoke, talk to your doctor about quitting.  For tips on how to quit, go to www.smokefree.gov or call 1-800-QUIT-NOW.  Be physically active- Exercise 5 days a week for at least 30 minutes.  If you are not already physically active start slow and gradually work up to 30 minutes of moderate physical activity.  Examples of moderate activity include walking briskly, mowing the yard, dancing, swimming, bicycling, etc.  Eat a healthy diet- Eat a variety of healthy food  such as fruits, vegetables, low fat milk, low fat cheese, yogurt, lean meant, poultry, fish, beans, tofu, etc. For more information go to www.thenutritionsource.org  Drink alcohol in moderation- Limit alcohol intake to less than two drinks a day. Never drink and drive.  Dentist- Brush and floss twice daily; visit your dentist twice a year.  Depression- Your emotional health is as important as your physical health. If you're feeling down, or losing interest in things you would normally enjoy please talk to your healthcare provider.  Eye exam- Visit your eye doctor every year.  Safe sex- If you may be exposed to a sexually transmitted infection, use a condom.  Seat belts- Seat belts can save your life; always wear one.  Smoke/Carbon Monoxide detectors- These detectors need to be installed on the appropriate level of your home.  Replace batteries at least once a year.  Skin cancer- When out in the sun, cover up and use sunscreen 15 SPF or higher.  Violence- If anyone is threatening you, please tell your healthcare provider.  Living Will/ Health care power of attorney- Speak with your healthcare provider and family.

## 2014-08-18 NOTE — Progress Notes (Signed)
Subjective:    Patient ID: [name], male    DOB: 10/12/1960, 53 y.o.   MRN: [id number]  HPI  This 53 y.o. AA male is here for CPE and fasting labs. Chronic medical problems include elevated lipids treated w/ a statin and Bipolar Disorder, monitored and treated by a psychiatrist. Pt has chronic joint pain involving back and knees; hx of athletic involvement when he was younger. Problems w/ hemorrhoids being addressed by Dr. Magnus Ivan, general surgeon.  HCM: IMM- Current.           CRS- 2014 (normal).           Vision- Annually.           Dental- Periodic.  Patient Active Problem List   Diagnosis Date Noted  . External hemorrhoid, bleeding 08/20/2013  . Bipolar 1 disorder 06/17/2013  . Hyperlipidemia 06/17/2013  . Eczema 06/17/2013    Prior to Admission medications   Medication Sig Start Date End Date Taking? Authorizing Provider  lithium carbonate 300 MG capsule Take 300 mg by mouth 2 (two) times daily with a meal.    Historical Provider, MD  pravastatin (PRAVACHOL) 80 MG tablet Take 1 tablet (80 mg total) by mouth daily. [facility] phone [phone] 01/21/14   Thao P Le, DO  traZODone (DESYREL) 100 MG tablet Take 100 mg by mouth at bedtime.    Historical Provider, MD    History   Social History  . Marital Status: Divorced    Spouse Name: engaged    Number of Children: N/A  . Years of Education: N/A   Occupational History  . Not on file.   Social History Main Topics  . Smoking status: Former Smoker    Quit date: 11/28/2009  . Smokeless tobacco: Never Used  . Alcohol Use: 2.4 oz/week    4 Cans of beer per week  . Drug Use: Yes    Special: Marijuana     Comment: occasional   . Sexual Activity: Yes   Other Topics Concern  . 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 Lives with fiance    Family History  Problem Relation Age of Onset  . Colon cancer Neg Hx   . Hypertension Mother   . Alcohol abuse Father   .  Hyperlipidemia Father   . Hyperlipidemia Brother     Review of Systems  Genitourinary:       Notes occasional dribbling at end of voiding.  Musculoskeletal: Positive for back pain, joint swelling and arthralgias.  Allergic/Immunologic: Positive for environmental allergies.  All other systems reviewed and are negative.      Objective:   Physical Exam  Constitutional: He is oriented to person, place, and time. Vital signs are normal. He appears well-developed and well-nourished. No distress.  HENT:  Head: Normocephalic and atraumatic.  Right Ear: Hearing, tympanic membrane, external ear and ear canal normal.  Left Ear: Hearing, tympanic membrane, external ear and ear canal normal.  Nose: Nose normal. No nasal deformity or septal deviation.  Mouth/Throat: Uvula is midline and oropharynx is clear and moist. No oral lesions.  Fair dentition.  Eyes: Conjunctivae, EOM and lids are normal. No scleral icterus.  Fundoscopic exam:      The right eye shows red reflex.       The left eye shows red reflex.  Neck: Trachea normal, normal range of motion, full passive range of motion without pain and phonation  normal. Neck supple. No JVD present. No spinous process tenderness and no muscular tenderness present. No thyroid mass and no thyromegaly present.  Cardiovascular: Normal rate, regular rhythm, S1 normal, S2 normal, normal heart sounds, intact distal pulses and normal pulses.   No extrasystoles are present. PMI is not displaced.  Exam reveals no gallop and no friction rub.   No murmur heard. Pulmonary/Chest: Effort normal and breath sounds normal. No respiratory distress. He has no decreased breath sounds. He has no wheezes. He exhibits no tenderness, no bony tenderness and no deformity.  Abdominal: Soft. Normal appearance, normal aorta and bowel sounds are normal. He exhibits no distension, no abdominal bruit, no pulsatile midline mass and no mass. There is no hepatosplenomegaly. There is no  tenderness. There is no guarding and no CVA tenderness.  Genitourinary: Rectal exam shows tenderness. Rectal exam shows no fissure, no mass and anal tone normal. Prostate is enlarged. Prostate is not tender.  Musculoskeletal:       Right shoulder: Normal.       Left shoulder: Normal.       Right knee: He exhibits decreased range of motion and deformity. He exhibits no effusion and normal alignment. No tenderness found.       Left knee: He exhibits no effusion, no deformity and normal alignment. Tenderness found.       Cervical back: Normal.       Thoracic back: Normal.       Lumbar back: Normal.       Right lower leg: Normal.       Left lower leg: Normal.  Remainder of exam unremarkable; L thumb joint deformity w/o tenderness.  Lymphadenopathy:       Head (right side): No submental, no submandibular, no tonsillar, no preauricular, no posterior auricular and no occipital adenopathy present.       Head (left side): No submental, no submandibular, no tonsillar, no preauricular, no posterior auricular and no occipital adenopathy present.    He has no cervical adenopathy.    He has no axillary adenopathy.       Right: No inguinal and no supraclavicular adenopathy present.       Left: No inguinal and no supraclavicular adenopathy present.  Neurological: He is alert and oriented to person, place, and time. He has normal strength. He displays no atrophy and no tremor. No cranial nerve deficit or sensory deficit. He exhibits normal muscle tone. He displays a negative Romberg sign. Coordination and gait normal.  Reflex Scores:      Tricep reflexes are 0 on the right side and 0 on the left side.      Bicep reflexes are 1+ on the right side and 1+ on the left side.      Brachioradialis reflexes are 0 on the right side and 0 on the left side.      Patellar reflexes are 1+ on the right side and 1+ on the left side. DTRs difficult to illicit.  Skin: Skin is warm, dry and intact. No ecchymosis, no lesion  and no rash noted. He is not diaphoretic. No cyanosis or erythema.  Psychiatric: He has a normal mood and affect. His speech is normal and behavior is normal. Judgment and thought content normal. Cognition and memory are normal.  Nursing note and vitals reviewed.      Assessment & Plan:  Annual physical exam - Plan: CBC with Differential, PSA, POCT urinalysis dipstick  Bipolar 1 disorder -  Continue current medications and follow-up w/  psychiatrist as scheduled. Plan: Lithium level, CBC with Differential, COMPLETE METABOLIC PANEL WITH GFR, Thyroid Panel With TSH  LABS RESULTS: FAX to Dr. Jeni SallesLynn Wesson @ [phone]- [phone] [facility]  Hyperlipidemia - Continue pravastatin 80 mg pending lab results. Plan: COMPLETE METABOLIC PANEL WITH GFR, Lipid panel  Encounter for medication review - Plan: Lithium level, CBC with Differential, COMPLETE METABOLIC PANEL WITH GFR, Thyroid Panel With TSH  Need for prophylactic vaccination and inoculation against influenza - Plan: Flu Vaccine QUAD 36+ mos IM

## 2014-08-19 LAB — PSA: PSA: 4.87 ng/mL — ABNORMAL HIGH (ref <=4.00)

## 2014-08-20 ENCOUNTER — Other Ambulatory Visit: Payer: Self-pay | Admitting: Family Medicine

## 2014-08-20 DIAGNOSIS — N400 Enlarged prostate without lower urinary tract symptoms: Secondary | ICD-10-CM

## 2014-08-20 DIAGNOSIS — R972 Elevated prostate specific antigen [PSA]: Secondary | ICD-10-CM

## 2014-08-24 ENCOUNTER — Encounter: Payer: Self-pay | Admitting: Radiology

## 2014-09-08 ENCOUNTER — Other Ambulatory Visit (INDEPENDENT_AMBULATORY_CARE_PROVIDER_SITE_OTHER): Payer: Self-pay | Admitting: Surgery

## 2014-09-12 ENCOUNTER — Telehealth (INDEPENDENT_AMBULATORY_CARE_PROVIDER_SITE_OTHER): Payer: Self-pay | Admitting: Surgery
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
## 2014-12-16 ENCOUNTER — Ambulatory Visit (INDEPENDENT_AMBULATORY_CARE_PROVIDER_SITE_OTHER): Payer: BLUE CROSS/BLUE SHIELD | Admitting: Sports Medicine

## 2014-12-16 VITALS — BP 130/70 | HR 61 | Temp 98.2°F | Ht 68.5 in | Wt 192.5 lb

## 2014-12-16 DIAGNOSIS — L0231 Cutaneous abscess of buttock: Secondary | ICD-10-CM

## 2014-12-16 MED ORDER — SULFAMETHOXAZOLE-TRIMETHOPRIM 800-160 MG PO TABS: 1.0000 | ORAL_TABLET | Freq: Two times a day (BID) | ORAL | Status: DC

## 2014-12-16 NOTE — Progress Notes (Signed)
  [name] - 54 y.o. male MRN [id number]  Date of birth: 31-Oct-1960  SUBJECTIVE:  Including CC & ROS.  patient C/O: boil next to the rectum Onset of symptoms: 1 week of rectal pain, swelling, and boil formation. He has a history of these in the past requiring I&D in the past. He has been evaluated by general surgery and has had hemorroid removal and reports concern for a rectal tract cause abscess but no surgery for this was needed.  Symptoms: swelling, erythema, and pain along the right buttocks particularly at the gluteal cleft. Relieving factors: patient is applied any topical treatments. He has used some ice for swelling and pain control. Worsened by: worse with any pressure and tender to the touch   ROS:  Constitutional:  No fever, chills, or fatigue.  Respiratory:  No shortness of breath, cough, or wheezing Cardiovascular:  No palpitations, chest pain or syncope Gastrointestinal:  No nausea, no abdominal pain Review of systems otherwise negative except for what is stated in HPI  HISTORY: Past Medical, Surgical, Social, and Family History Reviewed & Updated per EMR. Pertinent Historical Findings include: History of external hemorrhoids, gluteal abscesses in the past, bipolar type I, hyperlipidemia, depression History of heart bypass surgery at 54 years old History of hernia repair. Patient is a nonsmoker occasional drinker  PHYSICAL EXAM:  VS: BP:130/70 mmHg  HR:61bpm  TEMP:98.2 F (36.8 C)(Oral)  RESP:100 %  HT:5' 8.5" (174 cm)   WT:192 lb 8 oz (87.317 kg)  BMI:28.9 PHYSICAL EXAM: General:  Alert and oriented, No acute distress.   HENT:  Normocephalic, Oral mucosa is moist.   Respiratory:  Lungs are clear to auscultation, Respirations are non-labored, Symmetrical chest wall expansion.   Cardiovascular:  Normal rate, Regular rhythm, No murmur, Good pulses equal in all extremities, No edema.   Gastrointestinal:  Soft, Non-tender, Non-distended, Normal bowel sounds, No  organomegaly.   Integumentary:  Warm, Dry, No rash.  Buttocks: Patient has a 3 x 3 cm area of induration erythema and swelling along the right buttocks just proximal to the gluteal cleft. Most consistent with abscess.  Neurologic:  Alert, Oriented, No focal defects Psychiatric:  Cooperative, Appropriate mood & affect.    ASSESSMENT & PLAN:  Impression: Right-sided gluteal cleft abscess Possible track from rectum to abscess pocket  Recommendations: -Advised patient that based on the appearance of this abscess recommended treatment is irrigation and drainage which patient was agreeable with Proceeding with this procedure. -Wound culture was obtained to confirm and narrow antibiotic treatment -Started patient on Bactrim DS for chronic antibiotic coverage. -Educated patient that packing material be removed within 48 hours likely will need 1-2 additional packing within the next week -Advised patient to also follow-up with general surgery to confirm that there is no track from the rectum into this abscess pocket  Wound procedure: Date: 12/16/14  Confirmed: patient, side, site, safety procedures followed.    Performed by: Jed LimerickIDIANO , Leonore Frankson MARIE DO.    Informed consent:verbalized by patient  Indication: right buttocks cleft abscess Location: right buttocks Preparation and technique: skin prepped with soap and water, sterile preparation of site (with draped to expose affected area, eye protection, gloves and mask), local anesthesia 1% Lidocaine with epinephrine 4 mL  Ml used, wound irrigation with low pressure.    Wound assessment:: characteristics (erythematous,copious purulent), discharge (bloody yes, yellow-colored yes), inflammatory changes (redness yes, swelling yes), pain throbbing yes Wound culture collected    Procedure tolerated: fairly.

## 2014-12-18 ENCOUNTER — Ambulatory Visit (INDEPENDENT_AMBULATORY_CARE_PROVIDER_SITE_OTHER): Payer: BLUE CROSS/BLUE SHIELD | Admitting: Physician Assistant

## 2014-12-18 VITALS — BP 106/70 | HR 53 | Temp 97.6°F | Resp 18

## 2014-12-18 DIAGNOSIS — L0231 Cutaneous abscess of buttock: Secondary | ICD-10-CM

## 2014-12-18 DIAGNOSIS — Z6828 Body mass index (BMI) 28.0-28.9, adult: Secondary | ICD-10-CM | POA: Insufficient documentation

## 2014-12-18 MED ORDER — HYDROCODONE-ACETAMINOPHEN 5-325 MG PO TABS: 1.0000 | ORAL_TABLET | Freq: Four times a day (QID) | ORAL | Status: DC | PRN

## 2014-12-18 NOTE — Progress Notes (Signed)
   Patient ID: [name], male    DOB: 1961-05-05, 54 y.o.   MRN: [id number]  PCP: Tally DueGUEST, CHRIS WARREN, MD  Subjective:   Chief Complaint  Patient presents with  . Wound Check    HPI Presents for wound care, s/p I&D of an abscess of the LEFT buttock on 12/16/2014. Wound culture obtained is pending. Tolerating antibiotic without adverse effects. Not using a warm compress. Not trying to minimize time spent sitting, lying on his back or standing. Some improvement, but still very painful. He requests pain medication today. Dressing changes x 2 since initial visit.   Review of Systems Review of Systems As above.    Patient Active Problem List   Diagnosis Date Noted  . BMI 28.0-28.9,adult 12/18/2014  . External hemorrhoid, bleeding 08/20/2013  . Bipolar 1 disorder 06/17/2013  . Hyperlipidemia 06/17/2013  . Eczema 06/17/2013    No Known Allergies  Prior to Admission medications   Medication Sig Start Date End Date Taking? Authorizing Provider  lithium carbonate 300 MG capsule Take 300 mg by mouth 2 (two) times daily with a meal.   Yes Historical Provider, MD  pravastatin (PRAVACHOL) 80 MG tablet Take 1 tablet (80 mg total) by mouth daily. [facility] phone [phone] 01/21/14  Yes Thao P Le, DO  sulfamethoxazole-trimethoprim (BACTRIM DS,SEPTRA DS) 800-160 MG per tablet Take 1 tablet by mouth 2 (two) times daily. 12/16/14  Yes Deanna M Didiano, DO  traZODone (DESYREL) 100 MG tablet Take 100 mg by mouth at bedtime.   Yes Historical Provider, MD     Past Medical, Surgical Family and Social History reviewed and updated.        Objective:  Physical Exam  Physical Exam  Constitutional: He is oriented to person, place, and time. He appears well-developed and well-nourished. He is active and cooperative. No distress.  BP 106/70 mmHg  Pulse 53  Temp(Src) 97.6 F (36.4 C) (Oral)  Resp 18  SpO2 99%   Eyes: Conjunctivae are normal.  Pulmonary/Chest: Effort normal.    Neurological: He is alert and oriented to person, place, and time.  Skin: Skin is warm and dry.  Dressing and packing removed. Small amount of bloody purulence expressed. Several centimeters of induration surround the surgical wound, which is tender. Mild erythema. Wound irrigated with 3 cc of 2% lidocaine plain. Gently repacked with 1/4 inch plain packing. Dressing applied.  Psychiatric: He has a normal mood and affect. His speech is normal and behavior is normal.           Assessment & Plan:  1. Abscess of gluteal cleft Continue antibiotic pending culture results. Encouraged the use of warm compresses and avoidance of sitting whenever possible. He should spend most of him time lying prone. Continue PRN dressing changes. RTC 2 days for wound care.   Fernande Brashelle S. Deshara Rossi, PA-C Physician Assistant-Certified [facility]

## 2014-12-18 NOTE — Patient Instructions (Signed)
Continue the antibiotic as prescribed. Use the pain medication as needed. Apply a warm compress to the area for 15-20 minutes 2-4 times each day. Change the dressing if it becomes saturated, leaks or falls off.

## 2014-12-19 LAB — WOUND CULTURE: Organism ID, Bacteria: NO GROWTH

## 2014-12-20 ENCOUNTER — Ambulatory Visit (INDEPENDENT_AMBULATORY_CARE_PROVIDER_SITE_OTHER): Payer: BLUE CROSS/BLUE SHIELD | Admitting: Physician Assistant

## 2014-12-20 VITALS — BP 118/66 | HR 74 | Temp 98.2°F | Resp 17 | Ht 67.5 in | Wt 192.0 lb

## 2014-12-20 DIAGNOSIS — L0231 Cutaneous abscess of buttock: Secondary | ICD-10-CM
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
## 2014-12-23 ENCOUNTER — Ambulatory Visit (INDEPENDENT_AMBULATORY_CARE_PROVIDER_SITE_OTHER): Payer: BLUE CROSS/BLUE SHIELD | Admitting: Physician Assistant

## 2014-12-23 VITALS — BP 118/74 | HR 64 | Temp 98.0°F | Resp 18 | Ht 69.0 in | Wt 192.0 lb

## 2014-12-23 DIAGNOSIS — L0231 Cutaneous abscess of buttock: Secondary | ICD-10-CM | POA: Diagnosis not present

## 2014-12-23 DIAGNOSIS — J302 Other seasonal allergic rhinitis: Secondary | ICD-10-CM | POA: Diagnosis not present

## 2014-12-23 MED ORDER — CETIRIZINE HCL 10 MG PO TABS: 10.0000 mg | ORAL_TABLET | Freq: Every day | ORAL | Status: DC

## 2014-12-23 MED ORDER — FLUTICASONE PROPIONATE 50 MCG/ACT NA SUSP: 2.0000 | Freq: Every day | NASAL | Status: DC

## 2014-12-23 NOTE — Patient Instructions (Signed)

## 2014-12-23 NOTE — Progress Notes (Signed)
   Subjective:    Patient ID: [name], male    DOB: 1960/12/16, 54 y.o.   MRN: [id number]  HPI Patient presents for wound care and nasal congestion.  Had I&D of abscess of gluteal cleft 12/16/14. In the interim patient has been well. Wife has been helping with dressing changes and packing came out. Denies pain, fever, or nausea. Completed antibiotic course.  This time of year patient states his allergies always flare up. Currently endorses congestion, cough, itchy, water eyes, rhinorrhea, and left sided sinus pressure. Denies fever, wheezing, sore throat, ear pain, SOB/CP, N/V. States that once eyes start to bother him, he usually just goes in the house. Takes claritin at time, but never daily. Drinks plenty of water. NKDA. No h/o asthma.  Review of Systems  Constitutional: Negative for fever, chills and fatigue.  HENT: Positive for congestion, rhinorrhea and sinus pressure. Negative for ear discharge, ear pain, postnasal drip, sneezing and sore throat.   Eyes: Positive for discharge (watery) and itching. Negative for pain and redness.  Respiratory: Positive for cough. Negative for shortness of breath and wheezing.   Cardiovascular: Negative for chest pain.  Gastrointestinal: Negative for nausea, vomiting and abdominal pain.  Skin: Positive for wound.  Allergic/Immunologic: Positive for environmental allergies.  Neurological: Negative for headaches.       Objective:   Physical Exam  Constitutional: He is oriented to person, place, and time. He appears well-developed and well-nourished. No distress.  Blood pressure 118/74, pulse 64, temperature 98 F (36.7 C), temperature source Oral, resp. rate 18, height 5\' 9"  (1.753 m), weight 192 lb (87.091 kg), SpO2 98 %.  HENT:  Head: Normocephalic and atraumatic.  Right Ear: External ear normal.  Left Ear: External ear normal.  Mouth/Throat: Oropharynx is clear and moist. No oropharyngeal exudate.  Eyes: Conjunctivae are normal. Pupils  are equal, round, and reactive to light. Right eye exhibits no discharge. Left eye exhibits no discharge.  Neck: Normal range of motion. Neck supple. No thyromegaly present.  Cardiovascular: Normal rate, regular rhythm and normal heart sounds.  Exam reveals no gallop and no friction rub.   No murmur heard. Pulmonary/Chest: Effort normal and breath sounds normal. No respiratory distress. He has no wheezes. He has no rales. He exhibits no tenderness.  Lymphadenopathy:    He has cervical adenopathy.  Neurological: He is alert and oriented to person, place, and time.  Skin: Skin is warm and dry. No rash noted. He is not diaphoretic. No erythema. No pallor.  Intact dressing removed. No packing present. Wound irrigated and explored. Wound depth <1cm. Tissue healthy without necrosis. No surrounding erythema. No purulence expressed. Clean dressing placed.       Assessment & Plan:  1. Seasonal allergies Should drink plenty of water and avoid triggers when possible.  - fluticasone (FLONASE) 50 MCG/ACT nasal spray; Place 2 sprays into both nostrils daily.  Dispense: 16 g; Refill: 12 - cetirizine (ZYRTEC) 10 MG tablet; Take 1 tablet (10 mg total) by mouth daily.  Dispense: 30 tablet; Refill: 11  2. Abscess of gluteal cleft Wound healing well. Depth of wound smaller than surface opening. No additional f/u needed at this time. Would should close on its own. Should use warm compresses or heating pad 3-4x daily for 15 minutes. Continue to use dressings until completely closed.   Janan Ridgeishira Rosie Torrez PA-C  [facility] 12/23/2014 9:29 AM

## 2015-02-10 ENCOUNTER — Other Ambulatory Visit: Payer: Self-pay | Admitting: Family Medicine

## 2015-04-13 ENCOUNTER — Other Ambulatory Visit: Payer: Self-pay | Admitting: Physician Assistant

## 2015-04-14 ENCOUNTER — Other Ambulatory Visit: Payer: Self-pay

## 2015-04-14 DIAGNOSIS — J302 Other seasonal allergic rhinitis: Secondary | ICD-10-CM

## 2015-04-14 MED ORDER — FLUTICASONE PROPIONATE 50 MCG/ACT NA SUSP: 2.0000 | Freq: Every day | NASAL | Status: DC

## 2015-05-18 ENCOUNTER — Other Ambulatory Visit: Payer: Self-pay | Admitting: Family Medicine

## 2015-08-03 ENCOUNTER — Other Ambulatory Visit: Payer: Self-pay | Admitting: Physician Assistant

## 2015-08-20 ENCOUNTER — Other Ambulatory Visit: Payer: Self-pay | Admitting: Family Medicine

## 2018-05-07 ENCOUNTER — Other Ambulatory Visit: Payer: Self-pay

## 2018-05-07 ENCOUNTER — Emergency Department (HOSPITAL_COMMUNITY): Payer: Self-pay

## 2018-05-07 ENCOUNTER — Emergency Department (HOSPITAL_COMMUNITY)
Admission: EM | Admit: 2018-05-07 | Discharge: 2018-05-07 | Disposition: A | Discharge status: Home / Self Care | Payer: Self-pay | Attending: Emergency Medicine | Admitting: Emergency Medicine

## 2018-05-07 ENCOUNTER — Encounter (HOSPITAL_COMMUNITY): Payer: Self-pay | Admitting: Emergency Medicine

## 2018-05-07 DIAGNOSIS — Z87891 Personal history of nicotine dependence: Secondary | ICD-10-CM | POA: Insufficient documentation

## 2018-05-07 DIAGNOSIS — Z79899 Other long term (current) drug therapy: Secondary | ICD-10-CM | POA: Insufficient documentation

## 2018-05-07 DIAGNOSIS — G8929 Other chronic pain: Secondary | ICD-10-CM | POA: Insufficient documentation

## 2018-05-07 DIAGNOSIS — I2510 Atherosclerotic heart disease of native coronary artery without angina pectoris: Secondary | ICD-10-CM | POA: Insufficient documentation

## 2018-05-07 DIAGNOSIS — M25561 Pain in right knee: Secondary | ICD-10-CM | POA: Insufficient documentation

## 2018-05-07 DIAGNOSIS — M7989 Other specified soft tissue disorders: Secondary | ICD-10-CM | POA: Insufficient documentation

## 2018-05-07 HISTORY — DX: Atherosclerotic heart disease of native coronary artery without angina pectoris: I25.10

## 2018-05-07 MED ORDER — HYDROCODONE-ACETAMINOPHEN 5-325 MG PO TABS: ORAL_TABLET | ORAL | 0 refills | Status: DC

## 2018-05-07 MED ORDER — DEXAMETHASONE SODIUM PHOSPHATE 4 MG/ML IJ SOLN
10.0000 mg | Freq: Once | INTRAMUSCULAR | Status: AC
  Administered 2018-05-07: 10 mg via INTRAMUSCULAR
  Filled 2018-05-07: qty 3

## 2018-05-07 MED ORDER — HYDROCODONE-ACETAMINOPHEN 5-325 MG PO TABS
1.0000 | ORAL_TABLET | Freq: Once | ORAL | Status: AC
  Administered 2018-05-07: 1 via ORAL
  Filled 2018-05-07: qty 1

## 2018-05-07 MED ORDER — PREDNISONE 10 MG PO TABS: ORAL_TABLET | ORAL | 0 refills | Status: DC

## 2018-05-07 NOTE — Discharge Instructions (Addendum)
Wear your knee brace as needed.  Apply ice packs on and off to your knee.  Follow-up with your orthopedic provider in 1 week if not improving.  Start the prednisone prescription tomorrow.

## 2018-05-07 NOTE — ED Notes (Signed)
Patient transported to X-ray 

## 2018-05-07 NOTE — ED Notes (Signed)
Pt returned from xray

## 2018-05-07 NOTE — ED Notes (Signed)
Advised patient not to drive after discharge due to narcotic medication administration. Also advised patient not to drive while taking prescription pain medications. Patient verbalized understanding. Discharged with significant other to drive him home.

## 2018-05-07 NOTE — ED Notes (Signed)
Pain and swelling noted to right knee

## 2018-05-07 NOTE — ED Triage Notes (Signed)
Pt complaining of right knee pain for years. Pt reports started a new job and has been exerting himself more than he is used to. Pt reports swelling. Per pt ortho doctor he needs a knee replacement but pt doesn't wish to have it done.
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
## 2018-07-11 ENCOUNTER — Ambulatory Visit (INDEPENDENT_AMBULATORY_CARE_PROVIDER_SITE_OTHER): Payer: 59 | Admitting: Orthopaedic Surgery

## 2018-07-11 DIAGNOSIS — M1711 Unilateral primary osteoarthritis, right knee: Secondary | ICD-10-CM | POA: Diagnosis not present
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
## 2018-07-24 ENCOUNTER — Inpatient Hospital Stay (INDEPENDENT_AMBULATORY_CARE_PROVIDER_SITE_OTHER): Payer: 59 | Admitting: Orthopaedic Surgery

## 2018-08-04 NOTE — Pre-Procedure Instructions (Signed)
Tyler KhatGary L [name]  08/04/2018      [id number] - [place], [place] - [address] [place] [id number] Phone: [phone] Fax: [phone]  [id number] - [place], [place] - [address] [facility] [address] [place] [id number] Phone: [phone] Fax: [phone]    Your procedure is scheduled on November 25th.  [facility] at 0800 A.M.  Call this number if you have problems the morning of surgery:  [phone]   Remember:  Do not eat or drink after midnight.    Take these medicines the morning of surgery with A SIP OF WATER    amLODipine (NORVASC)  lithium carbonate  7 days prior to surgery STOP taking any Aspirin(unless otherwise instructed by your surgeon), Aleve, Naproxen, Ibuprofen, Motrin, Advil, Goody's, BC's, all herbal medications, fish oil, and all vitamins     Do not wear jewelry.  Do not wear lotions, powders, or colognes, or deodorant.  Men may shave face and neck.  Do not bring valuables to the hospital.  [facility] is not responsible for any belongings or valuables.  Contacts, dentures or bridgework may not be worn into surgery.  Leave your suitcase in the car.  After surgery it may be brought to your room.  For patients admitted to the hospital, discharge time will be determined by your treatment team.  Patients discharged the day of surgery will not be allowed to drive home.    [place]- Preparing For Surgery  Before surgery, you can play an important role. Because skin is not sterile, your skin needs to be as free of germs as possible. You can reduce the number of germs on your skin by washing with CHG (chlorahexidine gluconate) Soap before surgery.  CHG is an antiseptic cleaner which kills germs and bonds with the skin to continue killing germs even after washing.    Oral Hygiene is also important to reduce your risk of infection.  Remember - BRUSH YOUR TEETH THE MORNING OF SURGERY WITH  YOUR REGULAR TOOTHPASTE  Please do not use if you have an allergy to CHG or antibacterial soaps. If your skin becomes reddened/irritated stop using the CHG.  Do not shave (including legs and underarms) for at least 48 hours prior to first CHG shower. It is OK to shave your face.  Please follow these instructions carefully.   1. Shower the NIGHT BEFORE SURGERY and the MORNING OF SURGERY with CHG.   2. If you chose to wash your hair, wash your hair first as usual with your normal shampoo.  3. After you shampoo, rinse your hair and body thoroughly to remove the shampoo.  4. Use CHG as you would any other liquid soap. You can apply CHG directly to the skin and wash gently with a scrungie or a clean washcloth.   5. Apply the CHG Soap to your body ONLY FROM THE NECK DOWN.  Do not use on open wounds or open sores. Avoid contact with your eyes, ears, mouth and genitals (private parts). Wash Face and genitals (private parts)  with your normal soap.  6. Wash thoroughly, paying special attention to the area where your surgery will be performed.  7. Thoroughly rinse your body with warm water from the neck down.  8. DO NOT shower/wash with your normal soap after using and rinsing off the CHG Soap.  9. Pat yourself dry with a CLEAN TOWEL.  10. Wear CLEAN PAJAMAS to bed the  night before surgery, wear comfortable clothes the morning of surgery  11. Place CLEAN SHEETS on your bed the night of your first shower and DO NOT SLEEP WITH PETS.    Day of Surgery:  Do not apply any deodorants/lotions.  Please wear clean clothes to the hospital/surgery center.   Remember to brush your teeth WITH YOUR REGULAR TOOTHPASTE.    Please read over the following fact sheets that you were given.

## 2018-08-05 ENCOUNTER — Encounter (HOSPITAL_COMMUNITY): Payer: Self-pay

## 2018-08-05 ENCOUNTER — Ambulatory Visit (HOSPITAL_COMMUNITY)
Admission: RE | Admit: 2018-08-05 | Discharge: 2018-08-05 | Disposition: A | Discharge status: Home / Self Care | Payer: 59 | Source: Ambulatory Visit | Attending: Physician Assistant | Admitting: Physician Assistant

## 2018-08-05 ENCOUNTER — Encounter (HOSPITAL_COMMUNITY)
Admission: RE | Admit: 2018-08-05 | Discharge: 2018-08-05 | Disposition: A | Discharge status: Home / Self Care | Payer: 59 | Source: Ambulatory Visit | Attending: Orthopaedic Surgery | Admitting: Orthopaedic Surgery

## 2018-08-05 ENCOUNTER — Other Ambulatory Visit: Payer: Self-pay

## 2018-08-05 DIAGNOSIS — R918 Other nonspecific abnormal finding of lung field: Secondary | ICD-10-CM | POA: Insufficient documentation

## 2018-08-05 DIAGNOSIS — Z01818 Encounter for other preprocedural examination: Secondary | ICD-10-CM | POA: Diagnosis present

## 2018-08-05 DIAGNOSIS — R001 Bradycardia, unspecified: Secondary | ICD-10-CM | POA: Diagnosis not present

## 2018-08-05 DIAGNOSIS — M1711 Unilateral primary osteoarthritis, right knee: Secondary | ICD-10-CM

## 2018-08-05 HISTORY — DX: Unspecified osteoarthritis, unspecified site: M19.90

## 2018-08-05 HISTORY — DX: Essential (primary) hypertension: I10

## 2018-08-05 LAB — BASIC METABOLIC PANEL
ANION GAP: 5 (ref 5–15)
BUN: 6 mg/dL (ref 6–20)
CHLORIDE: 108 mmol/L (ref 98–111)
CO2: 25 mmol/L (ref 22–32)
Calcium: 9.1 mg/dL (ref 8.9–10.3)
Creatinine, Ser: 1.2 mg/dL (ref 0.61–1.24)
GFR calc non Af Amer: >60 mL/min (ref >60)
Glucose, Bld: 82 mg/dL (ref 70–99)
POTASSIUM: 4 mmol/L (ref 3.5–5.1)
Sodium: 138 mmol/L (ref 135–145)

## 2018-08-05 LAB — CBC WITH DIFFERENTIAL/PLATELET
Abs Immature Granulocytes: 0.02 10*3/uL (ref 0.00–0.07)
BASOS ABS: 0.1 10*3/uL (ref 0.0–0.1)
Basophils Relative: 2 %
Eosinophils Absolute: 0.3 10*3/uL (ref 0.0–0.5)
Eosinophils Relative: 7 %
HEMATOCRIT: 49.9 % (ref 39.0–52.0)
Hemoglobin: 14.6 g/dL (ref 13.0–17.0)
IMMATURE GRANULOCYTES: 0 %
LYMPHS ABS: 1.2 10*3/uL (ref 0.7–4.0)
Lymphocytes Relative: 23 %
MCH: 22.8 pg — ABNORMAL LOW (ref 26.0–34.0)
MCHC: 29.3 g/dL — ABNORMAL LOW (ref 30.0–36.0)
MCV: 77.8 fL — ABNORMAL LOW (ref 80.0–100.0)
Monocytes Absolute: 0.5 10*3/uL (ref 0.1–1.0)
Monocytes Relative: 11 %
NRBC: 0 % (ref 0.0–0.2)
Neutro Abs: 2.9 10*3/uL (ref 1.7–7.7)
Neutrophils Relative %: 57 %
Platelets: 213 10*3/uL (ref 150–400)
RBC: 6.41 MIL/uL — AB (ref 4.22–5.81)
RDW: 15.9 % — AB (ref 11.5–15.5)
WBC: 5.1 10*3/uL (ref 4.0–10.5)

## 2018-08-05 LAB — TYPE AND SCREEN
ABO/RH(D): O POS
Antibody Screen: NEGATIVE

## 2018-08-05 LAB — PROTIME-INR
INR: 1.02
PROTHROMBIN TIME: 13.3 s (ref 11.4–15.2)

## 2018-08-05 LAB — SURGICAL PCR SCREEN
MRSA, PCR: NEGATIVE
Staphylococcus aureus: NEGATIVE

## 2018-08-05 LAB — ABO/RH: ABO/RH(D): O POS

## 2018-08-05 LAB — APTT: aPTT: 30 seconds (ref 24–36)
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
## 2018-08-05 NOTE — Pre-Procedure Instructions (Signed)
Celestia KhatGary L Guertin  08/05/2018      [id number] - [place], [place] - [address] [place] [id number] Phone: [phone] Fax: [phone]  [id number] - [place], [place] - [address] [facility] [address] [place] [id number] Phone: [phone] Fax: [phone]    Your procedure is scheduled on November 25th.  [facility] at [facility] A.M.  Call this number if you have problems the morning of surgery:  [phone]   Remember:  Do not eat or drink after midnight.    Take these medicines the morning of surgery with A SIP OF WATER   lithium carbonate  7 days prior to surgery STOP taking any Aspirin(unless otherwise instructed by your surgeon), Aleve, Naproxen, Ibuprofen, Motrin, Advil, Goody's, BC's, all herbal medications, fish oil, and all vitamins     Do not wear jewelry.  Do not wear lotions, powders, or colognes, or deodorant.  Men may shave face and neck.  Do not bring valuables to the hospital.  [facility] is not responsible for any belongings or valuables.  Contacts, dentures or bridgework may not be worn into surgery.  Leave your suitcase in the car.  After surgery it may be brought to your room.  For patients admitted to the hospital, discharge time will be determined by your treatment team.  Patients discharged the day of surgery will not be allowed to drive home.    [place]- Preparing For Surgery  Before surgery, you can play an important role. Because skin is not sterile, your skin needs to be as free of germs as possible. You can reduce the number of germs on your skin by washing with CHG (chlorahexidine gluconate) Soap before surgery.  CHG is an antiseptic cleaner which kills germs and bonds with the skin to continue killing germs even after washing.    Oral Hygiene is also important to reduce your risk of infection.  Remember - BRUSH YOUR TEETH THE MORNING OF SURGERY WITH YOUR REGULAR  TOOTHPASTE  Please do not use if you have an allergy to CHG or antibacterial soaps. If your skin becomes reddened/irritated stop using the CHG.  Do not shave (including legs and underarms) for at least 48 hours prior to first CHG shower. It is OK to shave your face.  Please follow these instructions carefully.   1. Shower the NIGHT BEFORE SURGERY and the MORNING OF SURGERY with CHG.   2. If you chose to wash your hair, wash your hair first as usual with your normal shampoo.  3. After you shampoo, rinse your hair and body thoroughly to remove the shampoo.  4. Use CHG as you would any other liquid soap. You can apply CHG directly to the skin and wash gently with a scrungie or a clean washcloth.   5. Apply the CHG Soap to your body ONLY FROM THE NECK DOWN.  Do not use on open wounds or open sores. Avoid contact with your eyes, ears, mouth and genitals (private parts). Wash Face and genitals (private parts)  with your normal soap.  6. Wash thoroughly, paying special attention to the area where your surgery will be performed.  7. Thoroughly rinse your body with warm water from the neck down.  8. DO NOT shower/wash with your normal soap after using and rinsing off the CHG Soap.  9. Pat yourself dry with a CLEAN TOWEL.  10. Wear CLEAN PAJAMAS to bed the night before surgery, wear  comfortable clothes the morning of surgery  11. Place CLEAN SHEETS on your bed the night of your first shower and DO NOT SLEEP WITH PETS.    Day of Surgery:  Do not apply any deodorants/lotions.  Please wear clean clothes to the hospital/surgery center.   Remember to brush your teeth WITH YOUR REGULAR TOOTHPASTE.    Please read over the following fact sheets that you were given.

## 2018-08-08 MED ORDER — TRANEXAMIC ACID 1000 MG/10ML IV SOLN
2000.0000 mg | INTRAVENOUS | Status: DC
  Filled 2018-08-08 (×2): qty 20

## 2018-08-08 MED ORDER — TRANEXAMIC ACID-NACL 1000-0.7 MG/100ML-% IV SOLN
1000.0000 mg | INTRAVENOUS | Status: AC
  Administered 2018-08-11: 1000 mg via INTRAVENOUS
  Filled 2018-08-08: qty 100

## 2018-08-11 ENCOUNTER — Encounter (HOSPITAL_COMMUNITY)
Admission: RE | Disposition: A | Discharge status: Home Health | Payer: Self-pay | Source: Home / Self Care | Attending: Orthopaedic Surgery

## 2018-08-11 ENCOUNTER — Other Ambulatory Visit: Payer: Self-pay

## 2018-08-11 ENCOUNTER — Inpatient Hospital Stay (HOSPITAL_COMMUNITY): Payer: 59 | Admitting: Anesthesiology

## 2018-08-11 ENCOUNTER — Inpatient Hospital Stay (HOSPITAL_COMMUNITY)
Admission: RE | Admit: 2018-08-11 | Discharge: 2018-08-12 | DRG: 470 | Disposition: A | Discharge status: Home Health | Payer: 59 | Attending: Orthopaedic Surgery | Admitting: Orthopaedic Surgery

## 2018-08-11 ENCOUNTER — Observation Stay (HOSPITAL_COMMUNITY): Payer: 59

## 2018-08-11 ENCOUNTER — Encounter (HOSPITAL_COMMUNITY): Payer: Self-pay | Admitting: Surgery

## 2018-08-11 ENCOUNTER — Inpatient Hospital Stay (HOSPITAL_COMMUNITY): Payer: 59 | Admitting: Physician Assistant

## 2018-08-11 DIAGNOSIS — F319 Bipolar disorder, unspecified: Secondary | ICD-10-CM | POA: Diagnosis present

## 2018-08-11 DIAGNOSIS — M25761 Osteophyte, right knee: Secondary | ICD-10-CM | POA: Diagnosis present

## 2018-08-11 DIAGNOSIS — M21371 Foot drop, right foot: Secondary | ICD-10-CM | POA: Diagnosis not present

## 2018-08-11 DIAGNOSIS — D62 Acute posthemorrhagic anemia: Secondary | ICD-10-CM | POA: Diagnosis not present

## 2018-08-11 DIAGNOSIS — Z811 Family history of alcohol abuse and dependence: Secondary | ICD-10-CM | POA: Diagnosis not present

## 2018-08-11 DIAGNOSIS — I10 Essential (primary) hypertension: Secondary | ICD-10-CM | POA: Diagnosis present

## 2018-08-11 DIAGNOSIS — E669 Obesity, unspecified: Secondary | ICD-10-CM | POA: Diagnosis present

## 2018-08-11 DIAGNOSIS — M1711 Unilateral primary osteoarthritis, right knee: Principal | ICD-10-CM

## 2018-08-11 DIAGNOSIS — Z8349 Family history of other endocrine, nutritional and metabolic diseases: Secondary | ICD-10-CM | POA: Diagnosis not present

## 2018-08-11 DIAGNOSIS — Z6828 Body mass index (BMI) 28.0-28.9, adult: Secondary | ICD-10-CM

## 2018-08-11 DIAGNOSIS — Z87891 Personal history of nicotine dependence: Secondary | ICD-10-CM | POA: Diagnosis not present

## 2018-08-11 DIAGNOSIS — I2510 Atherosclerotic heart disease of native coronary artery without angina pectoris: Secondary | ICD-10-CM | POA: Diagnosis present

## 2018-08-11 DIAGNOSIS — Z8249 Family history of ischemic heart disease and other diseases of the circulatory system: Secondary | ICD-10-CM

## 2018-08-11 DIAGNOSIS — E785 Hyperlipidemia, unspecified: Secondary | ICD-10-CM | POA: Diagnosis present

## 2018-08-11 DIAGNOSIS — Z96659 Presence of unspecified artificial knee joint: Secondary | ICD-10-CM

## 2018-08-11 DIAGNOSIS — Z79899 Other long term (current) drug therapy: Secondary | ICD-10-CM | POA: Diagnosis not present

## 2018-08-11 HISTORY — PX: TOTAL KNEE ARTHROPLASTY: SHX125

## 2018-08-11 SURGERY — ARTHROPLASTY, KNEE, TOTAL
Anesthesia: Spinal | Site: Knee | Laterality: Right

## 2018-08-11 MED ORDER — CHLORHEXIDINE GLUCONATE 4 % EX LIQD: 60.0000 mL | Freq: Once | CUTANEOUS | Status: DC

## 2018-08-11 MED ORDER — METHOCARBAMOL 1000 MG/10ML IJ SOLN
500.0000 mg | Freq: Four times a day (QID) | INTRAVENOUS | Status: DC | PRN
  Filled 2018-08-11: qty 5

## 2018-08-11 MED ORDER — ALUM & MAG HYDROXIDE-SIMETH 200-200-20 MG/5ML PO SUSP: 30.0000 mL | ORAL | Status: DC | PRN

## 2018-08-11 MED ORDER — POLYETHYLENE GLYCOL 3350 17 G PO PACK: 17.0000 g | PACK | Freq: Every day | ORAL | Status: DC | PRN

## 2018-08-11 MED ORDER — ROPIVACAINE HCL 5 MG/ML IJ SOLN
INTRAMUSCULAR | Status: DC | PRN
  Administered 2018-08-11 (×2): 5 mL via PERINEURAL

## 2018-08-11 MED ORDER — LACTATED RINGERS IV SOLN: INTRAVENOUS | Status: DC

## 2018-08-11 MED ORDER — ACETAMINOPHEN 325 MG PO TABS: 325.0000 mg | ORAL_TABLET | Freq: Four times a day (QID) | ORAL | Status: DC | PRN

## 2018-08-11 MED ORDER — KETOROLAC TROMETHAMINE 15 MG/ML IJ SOLN
30.0000 mg | Freq: Four times a day (QID) | INTRAMUSCULAR | Status: AC
  Administered 2018-08-11 – 2018-08-12 (×3): 30 mg via INTRAVENOUS
  Filled 2018-08-11 (×3): qty 2

## 2018-08-11 MED ORDER — OXYCODONE HCL 5 MG PO TABS: 5.0000 mg | ORAL_TABLET | ORAL | 0 refills | Status: DC | PRN

## 2018-08-11 MED ORDER — ONDANSETRON HCL 4 MG/2ML IJ SOLN: 4.0000 mg | Freq: Four times a day (QID) | INTRAMUSCULAR | Status: DC | PRN

## 2018-08-11 MED ORDER — GABAPENTIN 300 MG PO CAPS
300.0000 mg | ORAL_CAPSULE | Freq: Three times a day (TID) | ORAL | Status: DC
  Administered 2018-08-11 – 2018-08-12 (×3): 300 mg via ORAL
  Filled 2018-08-11 (×4): qty 1

## 2018-08-11 MED ORDER — 0.9 % SODIUM CHLORIDE (POUR BTL) OPTIME
TOPICAL | Status: DC | PRN
  Administered 2018-08-11: 1000 mL

## 2018-08-11 MED ORDER — PROPOFOL 1000 MG/100ML IV EMUL
INTRAVENOUS | Status: AC
  Filled 2018-08-11: qty 200

## 2018-08-11 MED ORDER — HYDROMORPHONE HCL 1 MG/ML IJ SOLN: 0.2500 mg | INTRAMUSCULAR | Status: DC | PRN

## 2018-08-11 MED ORDER — BUPIVACAINE LIPOSOME 1.3 % IJ SUSP
20.0000 mL | INTRAMUSCULAR | Status: DC
  Filled 2018-08-11: qty 20

## 2018-08-11 MED ORDER — ONDANSETRON HCL 4 MG PO TABS: 4.0000 mg | ORAL_TABLET | Freq: Four times a day (QID) | ORAL | Status: DC | PRN

## 2018-08-11 MED ORDER — MAGNESIUM CITRATE PO SOLN: 1.0000 | Freq: Once | ORAL | Status: DC | PRN

## 2018-08-11 MED ORDER — LITHIUM CARBONATE 300 MG PO CAPS
300.0000 mg | ORAL_CAPSULE | Freq: Two times a day (BID) | ORAL | Status: DC
  Administered 2018-08-11 – 2018-08-12 (×2): 300 mg via ORAL
  Filled 2018-08-11 (×3): qty 1

## 2018-08-11 MED ORDER — LACTATED RINGERS IV SOLN
INTRAVENOUS | Status: DC | PRN
  Administered 2018-08-11: 10:00:00 via INTRAVENOUS

## 2018-08-11 MED ORDER — ONDANSETRON HCL 4 MG PO TABS: 4.0000 mg | ORAL_TABLET | Freq: Three times a day (TID) | ORAL | 0 refills | Status: DC | PRN

## 2018-08-11 MED ORDER — AMLODIPINE BESYLATE 5 MG PO TABS
5.0000 mg | ORAL_TABLET | Freq: Every day | ORAL | Status: DC
  Administered 2018-08-11: 5 mg via ORAL
  Filled 2018-08-11: qty 1

## 2018-08-11 MED ORDER — OXYCODONE HCL 5 MG PO TABS
10.0000 mg | ORAL_TABLET | ORAL | Status: DC | PRN
  Administered 2018-08-11: 10 mg via ORAL

## 2018-08-11 MED ORDER — HYDROMORPHONE HCL 1 MG/ML IJ SOLN: 0.5000 mg | INTRAMUSCULAR | Status: DC | PRN

## 2018-08-11 MED ORDER — CEFAZOLIN SODIUM-DEXTROSE 2-4 GM/100ML-% IV SOLN
2.0000 g | INTRAVENOUS | Status: AC
  Administered 2018-08-11: 2 g via INTRAVENOUS
  Filled 2018-08-11: qty 100

## 2018-08-11 MED ORDER — MEPERIDINE HCL 50 MG/ML IJ SOLN: 6.2500 mg | INTRAMUSCULAR | Status: DC | PRN

## 2018-08-11 MED ORDER — MIDAZOLAM HCL 2 MG/2ML IJ SOLN
INTRAMUSCULAR | Status: AC
  Filled 2018-08-11: qty 2

## 2018-08-11 MED ORDER — DOCUSATE SODIUM 100 MG PO CAPS
100.0000 mg | ORAL_CAPSULE | Freq: Two times a day (BID) | ORAL | Status: DC
  Administered 2018-08-11 – 2018-08-12 (×3): 100 mg via ORAL
  Filled 2018-08-11 (×3): qty 1

## 2018-08-11 MED ORDER — TRANEXAMIC ACID 1000 MG/10ML IV SOLN
INTRAVENOUS | Status: DC | PRN
  Administered 2018-08-11: 2000 mg via TOPICAL

## 2018-08-11 MED ORDER — PHENOL 1.4 % MT LIQD: 1.0000 | OROMUCOSAL | Status: DC | PRN

## 2018-08-11 MED ORDER — METHOCARBAMOL 500 MG PO TABS
500.0000 mg | ORAL_TABLET | Freq: Four times a day (QID) | ORAL | Status: DC | PRN
  Administered 2018-08-11: 500 mg via ORAL
  Filled 2018-08-11: qty 1

## 2018-08-11 MED ORDER — DIPHENHYDRAMINE HCL 12.5 MG/5ML PO ELIX: 25.0000 mg | ORAL_SOLUTION | ORAL | Status: DC | PRN

## 2018-08-11 MED ORDER — OXYCODONE HCL ER 10 MG PO T12A
10.0000 mg | EXTENDED_RELEASE_TABLET | Freq: Two times a day (BID) | ORAL | Status: DC
  Administered 2018-08-11 – 2018-08-12 (×3): 10 mg via ORAL
  Filled 2018-08-11 (×3): qty 1

## 2018-08-11 MED ORDER — SENNOSIDES-DOCUSATE SODIUM 8.6-50 MG PO TABS: 1.0000 | ORAL_TABLET | Freq: Every evening | ORAL | 1 refills | Status: AC | PRN

## 2018-08-11 MED ORDER — PROMETHAZINE HCL 25 MG PO TABS: 25.0000 mg | ORAL_TABLET | Freq: Four times a day (QID) | ORAL | 1 refills | Status: DC | PRN

## 2018-08-11 MED ORDER — METOCLOPRAMIDE HCL 5 MG/ML IJ SOLN: 5.0000 mg | Freq: Three times a day (TID) | INTRAMUSCULAR | Status: DC | PRN

## 2018-08-11 MED ORDER — SODIUM CHLORIDE 0.9 % IV SOLN
INTRAVENOUS | Status: DC
  Administered 2018-08-11: 14:00:00 via INTRAVENOUS

## 2018-08-11 MED ORDER — FENTANYL CITRATE (PF) 100 MCG/2ML IJ SOLN
INTRAMUSCULAR | Status: AC
  Filled 2018-08-11: qty 2

## 2018-08-11 MED ORDER — ROPIVACAINE HCL 7.5 MG/ML IJ SOLN
INTRAMUSCULAR | Status: DC | PRN
  Administered 2018-08-11 (×4): 5 mL via PERINEURAL

## 2018-08-11 MED ORDER — METOCLOPRAMIDE HCL 5 MG PO TABS: 5.0000 mg | ORAL_TABLET | Freq: Three times a day (TID) | ORAL | Status: DC | PRN

## 2018-08-11 MED ORDER — VANCOMYCIN HCL 1000 MG IV SOLR
INTRAVENOUS | Status: DC | PRN
  Administered 2018-08-11: 1000 mg via TOPICAL

## 2018-08-11 MED ORDER — METHOCARBAMOL 750 MG PO TABS: 750.0000 mg | ORAL_TABLET | Freq: Two times a day (BID) | ORAL | 0 refills | Status: DC | PRN

## 2018-08-11 MED ORDER — ACETAMINOPHEN 500 MG PO TABS
1000.0000 mg | ORAL_TABLET | Freq: Four times a day (QID) | ORAL | Status: AC
  Administered 2018-08-11 – 2018-08-12 (×3): 1000 mg via ORAL
  Filled 2018-08-11 (×3): qty 2

## 2018-08-11 MED ORDER — FENTANYL CITRATE (PF) 100 MCG/2ML IJ SOLN
100.0000 ug | Freq: Once | INTRAMUSCULAR | Status: AC
  Administered 2018-08-11: 100 ug via INTRAVENOUS

## 2018-08-11 MED ORDER — PROPOFOL 500 MG/50ML IV EMUL
INTRAVENOUS | Status: DC | PRN
  Administered 2018-08-11: 50 ug/kg/min via INTRAVENOUS

## 2018-08-11 MED ORDER — TRANEXAMIC ACID-NACL 1000-0.7 MG/100ML-% IV SOLN
1000.0000 mg | Freq: Once | INTRAVENOUS | Status: AC
  Administered 2018-08-11: 1000 mg via INTRAVENOUS
  Filled 2018-08-11: qty 100

## 2018-08-11 MED ORDER — ASPIRIN 81 MG PO CHEW
81.0000 mg | CHEWABLE_TABLET | Freq: Two times a day (BID) | ORAL | Status: DC
  Administered 2018-08-11 – 2018-08-12 (×2): 81 mg via ORAL
  Filled 2018-08-11 (×2): qty 1

## 2018-08-11 MED ORDER — SODIUM CHLORIDE 0.9 % IR SOLN
Status: DC | PRN
  Administered 2018-08-11: 3000 mL

## 2018-08-11 MED ORDER — OXYCODONE HCL 5 MG PO TABS
5.0000 mg | ORAL_TABLET | ORAL | Status: DC | PRN
  Administered 2018-08-12 (×2): 10 mg via ORAL
  Filled 2018-08-11 (×3): qty 2

## 2018-08-11 MED ORDER — CELECOXIB 200 MG PO CAPS
200.0000 mg | ORAL_CAPSULE | Freq: Two times a day (BID) | ORAL | Status: DC
  Administered 2018-08-11 – 2018-08-12 (×3): 200 mg via ORAL
  Filled 2018-08-11 (×3): qty 1

## 2018-08-11 MED ORDER — DEXAMETHASONE SODIUM PHOSPHATE 10 MG/ML IJ SOLN
10.0000 mg | Freq: Once | INTRAMUSCULAR | Status: AC
  Administered 2018-08-12: 10 mg via INTRAVENOUS
  Filled 2018-08-11: qty 1

## 2018-08-11 MED ORDER — PROMETHAZINE HCL 25 MG/ML IJ SOLN: 6.2500 mg | INTRAMUSCULAR | Status: DC | PRN

## 2018-08-11 MED ORDER — CEFAZOLIN SODIUM-DEXTROSE 2-4 GM/100ML-% IV SOLN
2.0000 g | Freq: Four times a day (QID) | INTRAVENOUS | Status: AC
  Administered 2018-08-11 – 2018-08-12 (×3): 2 g via INTRAVENOUS
  Filled 2018-08-11 (×3): qty 100

## 2018-08-11 MED ORDER — FENTANYL CITRATE (PF) 250 MCG/5ML IJ SOLN
INTRAMUSCULAR | Status: AC
  Filled 2018-08-11: qty 5

## 2018-08-11 MED ORDER — SORBITOL 70 % SOLN: 30.0000 mL | Freq: Every day | Status: DC | PRN

## 2018-08-11 MED ORDER — MENTHOL 3 MG MT LOZG: 1.0000 | LOZENGE | OROMUCOSAL | Status: DC | PRN

## 2018-08-11 MED ORDER — OXYCODONE HCL ER 10 MG PO T12A: 10.0000 mg | EXTENDED_RELEASE_TABLET | Freq: Two times a day (BID) | ORAL | 0 refills | Status: AC

## 2018-08-11 MED ORDER — TRAZODONE HCL 100 MG PO TABS
100.0000 mg | ORAL_TABLET | Freq: Every day | ORAL | Status: DC
  Administered 2018-08-11: 100 mg via ORAL
  Filled 2018-08-11: qty 1

## 2018-08-11 MED ORDER — KETOROLAC TROMETHAMINE 30 MG/ML IJ SOLN: 30.0000 mg | Freq: Once | INTRAMUSCULAR | Status: DC | PRN

## 2018-08-11 MED ORDER — MIDAZOLAM HCL 2 MG/2ML IJ SOLN
2.0000 mg | Freq: Once | INTRAMUSCULAR | Status: AC
  Administered 2018-08-11: 2 mg via INTRAVENOUS

## 2018-08-11 MED ORDER — ASPIRIN EC 81 MG PO TBEC: 81.0000 mg | DELAYED_RELEASE_TABLET | Freq: Two times a day (BID) | ORAL | 0 refills | Status: AC

## 2018-08-11 MED ORDER — VANCOMYCIN HCL 1000 MG IV SOLR
INTRAVENOUS | Status: AC
  Filled 2018-08-11: qty 1000

## 2018-08-11 MED ORDER — BUPIVACAINE LIPOSOME 1.3 % IJ SUSP
INTRAMUSCULAR | Status: DC | PRN
  Administered 2018-08-11: 11:00:00

## 2018-08-11 MED ORDER — BUPIVACAINE IN DEXTROSE 0.75-8.25 % IT SOLN
INTRATHECAL | Status: DC | PRN
  Administered 2018-08-11: 1.6 mL via INTRATHECAL

## 2018-08-11 SURGICAL SUPPLY — 76 items
ALCOHOL ISOPROPYL (RUBBING) (MISCELLANEOUS) ×3 IMPLANT
BAG DECANTER FOR FLEXI CONT (MISCELLANEOUS) ×3 IMPLANT
BANDAGE ESMARK 6X9 LF (GAUZE/BANDAGES/DRESSINGS) ×1 IMPLANT
BLADE SAW SGTL 13.0X1.19X90.0M (BLADE) ×3 IMPLANT
BNDG ELASTIC 6X10 VLCR STRL LF (GAUZE/BANDAGES/DRESSINGS) ×3 IMPLANT
BNDG ESMARK 6X9 LF (GAUZE/BANDAGES/DRESSINGS) ×3
BOWL SMART MIX CTS (DISPOSABLE) ×3 IMPLANT
CEMENT BONE R 1X40 (Cement) ×6 IMPLANT
CLOSURE STERI-STRIP 1/2X4 (GAUZE/BANDAGES/DRESSINGS) ×2
CLSR STERI-STRIP ANTIMIC 1/2X4 (GAUZE/BANDAGES/DRESSINGS) ×4 IMPLANT
COMP FEM CMT PERSONA SZ7 RT (Joint) ×3 IMPLANT
COMPONENT FEM CMT PRSONA SZ7RT (Joint) ×1 IMPLANT
COVER SURGICAL LIGHT HANDLE (MISCELLANEOUS) ×3 IMPLANT
COVER WAND RF STERILE (DRAPES) ×3 IMPLANT
CUFF TOURNIQUET SINGLE 34IN LL (TOURNIQUET CUFF) ×3 IMPLANT
CUFF TOURNIQUET SINGLE 44IN (TOURNIQUET CUFF) IMPLANT
DRAPE EXTREMITY T 121X128X90 (DRAPE) ×3 IMPLANT
DRAPE HALF SHEET 40X57 (DRAPES) ×3 IMPLANT
DRAPE INCISE IOBAN 66X45 STRL (DRAPES) IMPLANT
DRAPE ORTHO SPLIT 77X108 STRL (DRAPES) ×4
DRAPE POUCH INSTRU U-SHP 10X18 (DRAPES) ×3 IMPLANT
DRAPE SURG ORHT 6 SPLT 77X108 (DRAPES) ×2 IMPLANT
DRAPE U-SHAPE 47X51 STRL (DRAPES) ×3 IMPLANT
DRILL PIN HEADLESS TROCAR 3X75 (PIN) ×3 IMPLANT
DURAPREP 26ML APPLICATOR (WOUND CARE) ×9 IMPLANT
ELECT CAUTERY BLADE 6.4 (BLADE) ×3 IMPLANT
ELECT REM PT RETURN 9FT ADLT (ELECTROSURGICAL) ×3
ELECTRODE REM PT RTRN 9FT ADLT (ELECTROSURGICAL) ×1 IMPLANT
GAUZE SPONGE 4X4 12PLY STRL LF (GAUZE/BANDAGES/DRESSINGS) ×3 IMPLANT
GLOVE BIOGEL PI IND STRL 7.0 (GLOVE) ×3 IMPLANT
GLOVE BIOGEL PI IND STRL 8 (GLOVE) ×2 IMPLANT
GLOVE BIOGEL PI INDICATOR 7.0 (GLOVE) ×6
GLOVE BIOGEL PI INDICATOR 8 (GLOVE) ×4
GLOVE ECLIPSE 7.0 STRL STRAW (GLOVE) ×18 IMPLANT
GLOVE SKINSENSE NS SZ7.5 (GLOVE) ×2
GLOVE SKINSENSE STRL SZ7.5 (GLOVE) ×1 IMPLANT
GLOVE SURG SYN 7.5  E (GLOVE) ×12
GLOVE SURG SYN 7.5 E (GLOVE) ×6 IMPLANT
GOWN STRL REIN XL XLG (GOWN DISPOSABLE) ×3 IMPLANT
GOWN STRL REUS W/ TWL LRG LVL3 (GOWN DISPOSABLE) ×1 IMPLANT
GOWN STRL REUS W/TWL LRG LVL3 (GOWN DISPOSABLE) ×2
HANDPIECE INTERPULSE COAX TIP (DISPOSABLE) ×2
HOOD PEEL AWAY FLYTE STAYCOOL (MISCELLANEOUS) ×6 IMPLANT
INSERT TIB ARTISURF SZ6-7 (Insert) ×3 IMPLANT
KIT BASIN OR (CUSTOM PROCEDURE TRAY) ×3 IMPLANT
KIT TURNOVER KIT B (KITS) ×3 IMPLANT
MANIFOLD NEPTUNE II (INSTRUMENTS) ×3 IMPLANT
MARKER SKIN DUAL TIP RULER LAB (MISCELLANEOUS) ×3 IMPLANT
NEEDLE SPNL 18GX3.5 QUINCKE PK (NEEDLE) ×6 IMPLANT
NS IRRIG 1000ML POUR BTL (IV SOLUTION) ×3 IMPLANT
PACK TOTAL JOINT (CUSTOM PROCEDURE TRAY) ×3 IMPLANT
PAD ABD 8X10 STRL (GAUZE/BANDAGES/DRESSINGS) ×3 IMPLANT
PAD ARMBOARD 7.5X6 YLW CONV (MISCELLANEOUS) ×3 IMPLANT
PADDING CAST COTTON 6X4 STRL (CAST SUPPLIES) ×6 IMPLANT
SAW OSC TIP CART 19.5X105X1.3 (SAW) ×3 IMPLANT
SET HNDPC FAN SPRY TIP SCT (DISPOSABLE) ×1 IMPLANT
STAPLER VISISTAT 35W (STAPLE) IMPLANT
STEM COMP PATELLA VE 32 (Knees) ×3 IMPLANT
STEM TIBIA 5 DEG SZ F R KNEE (Knees) ×1 IMPLANT
SUCTION FRAZIER HANDLE 10FR (MISCELLANEOUS) ×2
SUCTION TUBE FRAZIER 10FR DISP (MISCELLANEOUS) ×1 IMPLANT
SUT ETHILON 2 0 FS 18 (SUTURE) IMPLANT
SUT MNCRL AB 3-0 PS2 27 (SUTURE) ×3 IMPLANT
SUT MNCRL AB 4-0 PS2 18 (SUTURE) IMPLANT
SUT VIC AB 0 CT1 27 (SUTURE) ×2
SUT VIC AB 0 CT1 27XBRD ANBCTR (SUTURE) ×1 IMPLANT
SUT VIC AB 1 CTX 27 (SUTURE) ×12 IMPLANT
SUT VIC AB 2-0 CT1 27 (SUTURE) ×8
SUT VIC AB 2-0 CT1 TAPERPNT 27 (SUTURE) ×4 IMPLANT
SYR 50ML LL SCALE MARK (SYRINGE) ×6 IMPLANT
TIBIA STEM 5 DEG SZ F R KNEE (Knees) ×3 IMPLANT
TOWEL OR 17X24 6PK STRL BLUE (TOWEL DISPOSABLE) ×3 IMPLANT
TOWEL OR 17X26 10 PK STRL BLUE (TOWEL DISPOSABLE) IMPLANT
TRAY CATH 16FR W/PLASTIC CATH (SET/KITS/TRAYS/PACK) IMPLANT
UNDERPAD 30X30 (UNDERPADS AND DIAPERS) ×3 IMPLANT
WRAP KNEE MAXI GEL POST OP (GAUZE/BANDAGES/DRESSINGS) ×3 IMPLANT

## 2018-08-11 NOTE — Transfer of Care (Signed)
Immediate Anesthesia Transfer of Care Note  Patient: [name]  Procedure(s) Performed: RIGHT TOTAL KNEE ARTHROPLASTY (Right Knee)  Patient Location: PACU  Anesthesia Type:Spinal  Level of Consciousness: awake, alert  and oriented  Airway & Oxygen Therapy: Patient Spontanous Breathing  Post-op Assessment: Report given to RN, Post -op Vital signs reviewed and stable and Patient moving all extremities X 4  Post vital signs: Reviewed and stable  Last Vitals:  Vitals Value Taken Time  BP    Temp    Pulse 55 08/11/2018 12:33 PM  Resp 15 08/11/2018 12:33 PM  SpO2 100 % 08/11/2018 12:33 PM  Vitals shown include unvalidated device data.  Last Pain:  Vitals:   08/11/18 0821  TempSrc:   PainSc: 0-No pain      Patients Stated Pain Goal: 6 (08/11/18 [id number])  Complications: No apparent anesthesia complications

## 2018-08-11 NOTE — Anesthesia Preprocedure Evaluation (Addendum)
Anesthesia Evaluation  Patient identified by MRN, date of birth, ID band Patient awake    Reviewed: Allergy & Precautions, NPO status , Patient's Chart, lab work & pertinent test results  Airway Mallampati: I       Dental no notable dental hx. (+) Edentulous Upper, Edentulous Lower   Pulmonary neg pulmonary ROS, former smoker,    Pulmonary exam normal breath sounds clear to auscultation       Cardiovascular hypertension, Pt. on medications + CAD  Normal cardiovascular exam Rhythm:Regular Rate:Normal     Neuro/Psych PSYCHIATRIC DISORDERS Depression Bipolar Disorder negative neurological ROS     GI/Hepatic negative GI ROS, Neg liver ROS,   Endo/Other  negative endocrine ROS  Renal/GU negative Renal ROS  negative genitourinary   Musculoskeletal  (+) Arthritis , Osteoarthritis,    Abdominal Normal abdominal exam  (+)   Peds  Hematology negative hematology ROS (+)   Anesthesia Other Findings   Reproductive/Obstetrics                            Anesthesia Physical Anesthesia Plan  ASA: II  Anesthesia Plan: Spinal   Post-op Pain Management:  Regional for Post-op pain   Induction:   PONV Risk Score and Plan: Ondansetron, Dexamethasone and Midazolam  Airway Management Planned: Nasal Cannula and Natural Airway  Additional Equipment:   Intra-op Plan:   Post-operative Plan:   Informed Consent: I have reviewed the patients History and Physical, chart, labs and discussed the procedure including the risks, benefits and alternatives for the proposed anesthesia with the patient or authorized representative who has indicated his/her understanding and acceptance.     Plan Discussed with: CRNA and Surgeon  Anesthesia Plan Comments:         Anesthesia Quick Evaluation

## 2018-08-11 NOTE — Progress Notes (Signed)
Pt arrived to room 5N10 after surgery. Received report from RoscoeJenna, RN in PACU. See assessment. Will continue to monitor.

## 2018-08-11 NOTE — Progress Notes (Signed)
Physical Therapy Evaluation Patient Details Name: Tyler KhatGary L Braddock MRN: [id number] DOB: [birth year] Today's Date: 08/11/2018   History of Present Illness  pt is a 57 y/o male with PMH significant for HTN, CAD, Bipolar d/o s/p elective R TKA.  Clinical Impression  Pt admitted with/for elective R TKA.  Pt needing up to min guard assist.  Pt currently limited functionally due to the problems listed. ( See problems list.)   Pt will benefit from PT to maximize function and safety in order to get ready for next venue listed below.     Follow Up Recommendations Follow surgeon's recommendation for DC plan and follow-up therapies;Supervision - Intermittent    Equipment Recommendations  Rolling walker with 5" wheels;3in1 (PT)    Recommendations for Other Services       Precautions / Restrictions Precautions Precautions: Knee Precaution Booklet Issued: Yes (comment) Restrictions RLE Weight Bearing: Weight bearing as tolerated      Mobility  Bed Mobility Overal bed mobility: Modified Independent                Transfers Overall transfer level: Needs assistance   Transfers: Sit to/from Stand Sit to Stand: Min guard         General transfer comment: cues for safe transfer technique  Ambulation/Gait Ambulation/Gait assistance: Min guard Gait Distance (Feet): 80 Feet Assistive device: Rolling walker (2 wheeled) Gait Pattern/deviations: Step-to pattern   Gait velocity interpretation: <1.8 ft/sec, indicate of risk for recurrent falls General Gait Details: mildly unsteady step to/step through gait with foot drop on the R with steppage gaitl  Stairs            Wheelchair Mobility    Modified Rankin (Stroke Patients Only)       Balance Overall balance assessment: No apparent balance deficits (not formally assessed)                                           Pertinent Vitals/Pain Pain Assessment: Faces Faces Pain Scale: Hurts a little  bit Pain Location: right knee Pain Descriptors / Indicators: Aching;Sore Pain Intervention(s): Premedicated before session    Home Living Family/patient expects to be discharged to:: Private residence Living Arrangements: Spouse/significant other Available Help at Discharge: Family;Available 24 hours/day Type of Home: House Home Access: Stairs to enter Entrance Stairs-Rails: Doctor, general practiceight;Left Entrance Stairs-Number of Steps: 3 Home Layout: One level Home Equipment: Cane - single point      Prior Function Level of Independence: Independent               Hand Dominance        Extremity/Trunk Assessment   Upper Extremity Assessment Upper Extremity Assessment: Overall WFL for tasks assessed    Lower Extremity Assessment Lower Extremity Assessment: RLE deficits/detail RLE Deficits / Details: functional strength, knee flexion active to 90*,  Passive ext to 0*       Communication   Communication: No difficulties  Cognition Arousal/Alertness: Awake/alert Behavior During Therapy: WFL for tasks assessed/performed Overall Cognitive Status: Within Functional Limits for tasks assessed                                        General Comments      Exercises Total Joint Exercises Ankle Circles/Pumps: AROM;20 reps Quad Sets: AROM;Both;10 reps Heel Slides: AROM;Right;10  reps;Supine   Assessment/Plan    PT Assessment Patient needs continued PT services  PT Problem List Decreased strength;Decreased activity tolerance;Decreased mobility;Decreased knowledge of use of DME;Pain;Decreased range of motion       PT Treatment Interventions DME instruction;Gait training;Functional mobility training;Therapeutic activities;Stair training;Therapeutic exercise;Patient/family education    PT Goals (Current goals can be found in the Care Plan section)  Acute Rehab PT Goals Patient Stated Goal: independent and back to work PT Goal Formulation: With patient Time For Goal  Achievement: 08/18/18 Potential to Achieve Goals: Good    Frequency 7X/week   Barriers to discharge        Co-evaluation               AM-PAC PT "6 Clicks" Mobility  Outcome Measure Help needed turning from your back to your side while in a flat bed without using bedrails?: None Help needed moving from lying on your back to sitting on the side of a flat bed without using bedrails?: None Help needed moving to and from a bed to a chair (including a wheelchair)?: None Help needed standing up from a chair using your arms (e.g., wheelchair or bedside chair)?: None Help needed to walk in hospital room?: None Help needed climbing 3-5 steps with a railing? : A Little 6 Click Score: 23    End of Session   Activity Tolerance: Patient tolerated treatment well Patient left: in bed;with call bell/phone within reach;with family/visitor present Nurse Communication: Mobility status PT Visit Diagnosis: Other abnormalities of gait and mobility (R26.89);Pain Pain - Right/Left: Right Pain - part of body: Knee    Time: 8119-1478 PT Time Calculation (min) (ACUTE ONLY): 24 min   Charges:   PT Evaluation $PT Eval Low Complexity: 1 Low PT Treatments $Gait Training: 8-22 mins        08/11/2018  DeWitt Bing, PT Acute Rehabilitation Services [phone]  (pager) [phone]  (office)  Eliseo Gum Garlin [place] 08/11/2018, 6:05 PM
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
## 2018-08-11 NOTE — Progress Notes (Signed)
Orthopedic Tech Progress Note Patient Details:  Celestia KhatGary L Ky 1961-06-17 [id number]  CPM Right Knee CPM Right Knee: On Right Knee Flexion (Degrees): 90 Right Knee Extension (Degrees): 0 Additional Comments: foot roll  Post Interventions Patient Tolerated: Well Instructions Provided: Care of device, Adjustment of device  Saul FordyceJennifer C Cartina Brousseau 08/11/2018, 12:46 PM

## 2018-08-11 NOTE — Plan of Care (Signed)

## 2018-08-11 NOTE — Anesthesia Procedure Notes (Signed)
Anesthesia Regional Block: Adductor canal block   Pre-Anesthetic Checklist: ,, timeout performed, Correct Patient, Correct Site, Correct Laterality, Correct Procedure, Correct Position, site marked, Risks and benefits discussed,  Surgical consent,  Pre-op evaluation,  At surgeon's request and post-op pain management  Laterality: Right and Lower  Prep: chloraprep       Needles:  Injection technique: Single-shot  Needle Type: Echogenic Stimulator Needle     Needle Length: 10cm  Needle Gauge: 21   Needle insertion depth: 2.5 cm   Additional Needles:   Procedures:,,,, ultrasound used (permanent image in chart),,,,  Narrative:  Start time: 08/11/2018 9:00 AM End time: 08/11/2018 9:07 AM Injection made incrementally with aspirations every 5 mL.  Performed by: Personally  Anesthesiologist: Leilani AbleHatchett, [place] Vandergriff, MD

## 2018-08-11 NOTE — Discharge Instructions (Signed)

## 2018-08-11 NOTE — Op Note (Signed)
Total Knee Arthroplasty Procedure Note  Preoperative diagnosis: Right knee osteoarthritis  Postoperative diagnosis:same  Operative procedure: Right total knee arthroplasty. CPT [phone]  Surgeon: N. Glee Arvin, MD  Assist: Oneal Grout, PA-C; necessary for the timely completion of procedure and due to complexity of procedure.  Anesthesia: Spinal, regional  Tourniquet time: see anesthesia record  Implants used: Zimmer Persona Femur: CR 7 Tibia: F Patella: 32 mm Polyethylene: 12 mm MC  Indication: [name] is a 57 y.o. year old male with a history of knee pain. Having failed conservative management, the patient elected to proceed with a total knee arthroplasty.  We have reviewed the risk and benefits of the surgery and they elected to proceed after voicing understanding.  Procedure:  After informed consent was obtained and understanding of the risk were voiced including but not limited to bleeding, infection, damage to surrounding structures including nerves and vessels, blood clots, leg length inequality and the failure to achieve desired results, the operative extremity was marked with verbal confirmation of the patient in the holding area.   The patient was then brought to the operating room and transported to the operating room table in the supine position.  A tourniquet was applied to the operative extremity around the upper thigh. The operative limb was then prepped and draped in the usual sterile fashion and preoperative antibiotics were administered.  A time out was performed prior to the start of surgery confirming the correct extremity, preoperative antibiotic administration, as well as team members, implants and instruments available for the case. Correct surgical site was also confirmed with preoperative radiographs. The limb was then elevated for exsanguination and the tourniquet was inflated. A midline incision was made and a standard medial parapatellar  approach was performed.  The patella was prepared and sized to a 32 mm.  A cover was placed on the patella for protection from retractors.  We then turned our attention to the femur. Posterior cruciate ligament was feathered with a bovie but not entirely sacrificed. Start site was drilled in the femur and the intramedullary distal femoral cutting guide was placed, set at 5 degrees valgus, taking 10 mm of distal resection. The distal cut was made. Osteophytes were then removed. Next, the proximal tibial cutting guide was placed with appropriate slope, varus/valgus alignment and depth of resection. The proximal tibial cut was made. Gap blocks were then used to assess the extension gap and alignment, and appropriate soft tissue releases were performed. Attention was turned back to the femur, which was sized using the sizing guide to a size 7. Appropriate rotation of the femoral component was determined using epicondylar axis, Whiteside's line, and assessing the flexion gap under ligament tension. The appropriate size 4-in-1 cutting block was placed and cuts were made. Posterior femoral osteophytes and uncapped bone were then removed with the curved osteotome. The tibia was sized for a size F component. The femoral lug holes were drilled.  Trial components were placed, and stability was checked in full extension, mid-flexion, and deep flexion. There was no posterior sagging of the tibia.  Proper tibial rotation was determined and marked.  The patella tracked well without a lateral release. Trial components were then removed and tibial preparation performed. A posterior capsular injection comprising of 20 cc of 1.3% exparel and 40 cc of normal saline was performed for postoperative pain control. The bony surfaces were irrigated with a pulse lavage and then dried. Bone cement was vacuum mixed on the back table, and the  final components sized above were cemented into place. After cement had finished curing, excess  cement was removed. The stability of the construct was re-evaluated throughout a range of motion and found to be acceptable. The trial liner was removed, the knee was copiously irrigated, and the knee was re-evaluated for any excess bone debris. The real polyethylene liner, 12 mm thick, was inserted and checked to ensure the locking mechanism had engaged appropriately. The tourniquet was deflated and hemostasis was achieved. The wound was irrigated with normal saline.  One gram of vancomycin powder was placed in the surgical bed. A drain was not placed. Capsular closure was performed with a #1 vicryl, subcutaneous fat closed with a 0 vicryl suture, then subcutaneous tissue closed with interrupted 2.0 vicryl suture. The skin was then closed with a 3.0 monocryl. A sterile dressing was applied.  The patient was awakened in the operating room and taken to recovery in stable condition. All sponge, needle, and instrument counts were correct at the end of the case.  Position: supine  Complications: none.  Time Out: performed   Drains/Packing: none  Estimated blood loss: minimal  Returned to Recovery Room: in good condition.   Antibiotics: yes   Mechanical VTE (DVT) Prophylaxis: sequential compression devices, TED thigh-high  Chemical VTE (DVT) Prophylaxis: aspirin  Fluid Replacement  Crystalloid: see anesthesia record Blood: none  FFP: none   Specimens Removed: 1 to pathology   Sponge and Instrument Count Correct? yes   PACU: portable radiograph - knee AP and Lateral   Admission: inpatient status  Plan/RTC: Return in 2 weeks for wound check.   Weight Bearing/Load Lower Extremity: full   N. Glee ArvinMichael Xu, MD [facility] [phone] 11:42 AM

## 2018-08-11 NOTE — OR Nursing (Signed)
IN AND OUT CATHETER AT 1125. 500CC CLEAR YELLOW URINE.  UNABLE TO CHART CATHETER UNDER LINES AND DRAINS.

## 2018-08-11 NOTE — Anesthesia Procedure Notes (Signed)
Spinal  Patient location during procedure: OR Start time: 08/11/2018 9:52 AM End time: 08/11/2018 9:56 AM Staffing Anesthesiologist: Leilani AbleHatchett, Delbert Darley, MD Performed: anesthesiologist  Preanesthetic Checklist Completed: patient identified, site marked, surgical consent, pre-op evaluation, timeout performed, IV checked, risks and benefits discussed and monitors and equipment checked Spinal Block Patient position: sitting Prep: site prepped and draped and DuraPrep Patient monitoring: continuous pulse ox and blood pressure Approach: midline Location: L3-4 Injection technique: single-shot Needle Needle type: Pencan  Needle gauge: 24 G Needle length: 10 cm Needle insertion depth: 6 cm Assessment Sensory level: T8

## 2018-08-11 NOTE — Anesthesia Postprocedure Evaluation (Signed)
Anesthesia Post Note  Patient: [name]  Procedure(s) Performed: RIGHT TOTAL KNEE ARTHROPLASTY (Right Knee)     Patient location during evaluation: PACU Anesthesia Type: Spinal Level of consciousness: awake Pain management: pain level controlled Vital Signs Assessment: post-procedure vital signs reviewed and stable Respiratory status: spontaneous breathing Cardiovascular status: stable Postop Assessment: patient able to bend at knees, spinal receding, no headache, no backache and no apparent nausea or vomiting Anesthetic complications: no    Last Vitals:  Vitals:   08/11/18 1303 08/11/18 1318  BP: (!) 148/88 130/85  Pulse: (!) 49 (!) 47  Resp: 20 (!) 25  Temp:    SpO2: 97% 98%    Last Pain:  Vitals:   08/11/18 1305  TempSrc:   PainSc: 0-No pain   Pain Goal: Patients Stated Pain Goal: 6 (08/11/18 0821)               [name]

## 2018-08-12 ENCOUNTER — Encounter (HOSPITAL_COMMUNITY): Payer: Self-pay | Admitting: Orthopaedic Surgery

## 2018-08-12 LAB — CBC
HEMATOCRIT: 40.7 % (ref 39.0–52.0)
Hemoglobin: 12.3 g/dL — ABNORMAL LOW (ref 13.0–17.0)
MCH: 23 pg — AB (ref 26.0–34.0)
MCHC: 30.2 g/dL (ref 30.0–36.0)
MCV: 76.2 fL — ABNORMAL LOW (ref 80.0–100.0)
Platelets: 176 10*3/uL (ref 150–400)
RBC: 5.34 MIL/uL (ref 4.22–5.81)
RDW: 14.9 % (ref 11.5–15.5)
WBC: 5.5 10*3/uL (ref 4.0–10.5)
nRBC: 0 % (ref 0.0–0.2)

## 2018-08-12 LAB — BASIC METABOLIC PANEL
Anion gap: 4 — ABNORMAL LOW (ref 5–15)
BUN: 6 mg/dL (ref 6–20)
CO2: 26 mmol/L (ref 22–32)
Calcium: 8.3 mg/dL — ABNORMAL LOW (ref 8.9–10.3)
Chloride: 105 mmol/L (ref 98–111)
Creatinine, Ser: 1.5 mg/dL — ABNORMAL HIGH (ref 0.61–1.24)
GFR calc Af Amer: 58 mL/min — ABNORMAL LOW (ref >60)
GFR calc non Af Amer: 50 mL/min — ABNORMAL LOW (ref >60)
Glucose, Bld: 119 mg/dL — ABNORMAL HIGH (ref 70–99)
Potassium: 4.1 mmol/L (ref 3.5–5.1)
Sodium: 135 mmol/L (ref 135–145)

## 2018-08-12 NOTE — Progress Notes (Signed)
Physical Therapy Treatment Patient Details Name: [name] MRN: [id number] DOB: May 27, 1961 Today's Date: 08/12/2018    History of Present Illness pt is a 57 y/o male with PMH significant for HTN, CAD, Bipolar d/o s/p elective R TKA.    PT Comments    Pt is mod indep with gait on the unit with RW. Pt does have R foot drop affecting his balance when challenged with dynamic activities. Educated pt on risk of falls due to decreased ankle balance reactions. Notified RN about drop foot who reached out to ortho MD. Pt has been educated on HEP to include ankle exercises. Pt is ready for d/c home when medically cleared.   Follow Up Recommendations  Follow surgeon's recommendation for DC plan and follow-up therapies;Supervision - Intermittent     Equipment Recommendations  Rolling walker with 5" wheels;3in1 (PT)    Recommendations for Other Services       Precautions / Restrictions Precautions Precautions: Knee;Other (comment)(R foot drop new onset) Restrictions RLE Weight Bearing: Weight bearing as tolerated    Mobility  Bed Mobility Overal bed mobility: Independent                Transfers Overall transfer level: Modified independent Equipment used: Rolling walker (2 wheeled) Transfers: Sit to/from Stand Sit to Stand: Modified independent (Device/Increase time)         General transfer comment: cues for safe transfer technique  Ambulation/Gait Ambulation/Gait assistance: Modified independent (Device/Increase time)(level surfaces, R foot drop) Gait Distance (Feet): 300 Feet Assistive device: Rolling walker (2 wheeled) Gait Pattern/deviations: Steppage;Decreased dorsiflexion - right;Step-through pattern     General Gait Details: R foot drop, increased hip and knee flexion to clear foot.    Stairs Stairs: Yes Stairs assistance: Modified independent (Device/Increase time) Stair Management: Two rails;Alternating pattern;Forwards Number of Stairs: 3      Wheelchair Mobility    Modified Rankin (Stroke Patients Only)       Balance Overall balance assessment: Needs assistance         Standing balance support: Bilateral upper extremity supported Standing balance-Leahy Scale: Fair Standing balance comment: No ankle strategy to assist with balance loss due to foot drop. Pt had a stumble when challlenged with backwards walking and quick turns. Pt is able to compensate for foot drop on level surfaces with RW.                            Cognition Arousal/Alertness: Awake/alert Behavior During Therapy: WFL for tasks assessed/performed Overall Cognitive Status: Within Functional Limits for tasks assessed                                 General Comments: Pt reports he has been walking with RW and his family in the halls.      Exercises Total Joint Exercises Ankle Circles/Pumps: AROM;Strengthening;Both;Supine;15 reps;AAROM;Right;Seated;Limitations Ankle Circles/Pumps Limitations: R anterior tibalis 0/5, educated on AAROM DF/PF/IV/EV Quad Sets: AROM;Strengthening;Right;Supine Heel Slides: AROM;Strengthening;Right;15 reps;Supine Hip ABduction/ADduction: AROM;Strengthening;Right;Supine;15 reps Straight Leg Raises: AROM;Strengthening;Right;15 reps;Supine Long Arc Quad: AROM;Strengthening;Right;Seated;15 reps Knee Flexion: AROM;Strengthening;Right;Seated Goniometric ROM: 90*    General Comments        Pertinent Vitals/Pain Pain Assessment: No/denies pain Pain Score: 2  Pain Location: right knee Pain Descriptors / Indicators: Discomfort;Sore Pain Intervention(s): Monitored during session    Home Living  Prior Function            PT Goals (current goals can now be found in the care plan section) Progress towards PT goals: Progressing toward goals    Frequency    7X/week      PT Plan Current plan remains appropriate    Co-evaluation               AM-PAC PT "6 Clicks" Mobility   Outcome Measure  Help needed turning from your back to your side while in a flat bed without using bedrails?: None Help needed moving from lying on your back to sitting on the side of a flat bed without using bedrails?: None Help needed moving to and from a bed to a chair (including a wheelchair)?: None Help needed standing up from a chair using your arms (e.g., wheelchair or bedside chair)?: None Help needed to walk in hospital room?: None Help needed climbing 3-5 steps with a railing? : A Little 6 Click Score: 23    End of Session   Activity Tolerance: Patient tolerated treatment well Patient left: in bed;with call bell/phone within reach Nurse Communication: Mobility status PT Visit Diagnosis: Other abnormalities of gait and mobility (R26.89);Pain Pain - Right/Left: Right Pain - part of body: Knee     Time: 6045-4098 PT Time Calculation (min) (ACUTE ONLY): 24 min  Charges:  $Gait Training: 8-22 mins $Therapeutic Exercise: 8-22 mins                     Lilyan Punt, PT   [name] 08/12/2018, 11:12 AM

## 2018-08-12 NOTE — Progress Notes (Signed)
Discharge instructions (including medications) discussed with and copy provided to patient/caregiver. Education given and reviewed with patient

## 2018-08-12 NOTE — Progress Notes (Signed)
Physical Therapy Treatment Patient Details Name: Tyler KhatGary L Debroux MRN: [id number] DOB: October 07, 1960 Today's Date: 08/12/2018    History of Present Illness pt is a 57 y/o male with PMH significant for HTN, CAD, Bipolar d/o s/p elective R TKA.    PT Comments    Pt is making excellent progress with mobility. Pt reports he may D/C home today. Pt completed step training this morning with supervision. Pt does have R foot drop with no active contraction noted in R anterior Tibialis muscle. Advised pt to mention this to the ortho MD. Pt is safe to d/c home per surgeon recommendations. Pt will continue to benefit from skilled PT to imporve strength and mobility until d/c home.   Follow Up Recommendations  Follow surgeon's recommendation for DC plan and follow-up therapies;Supervision - Intermittent     Equipment Recommendations  Rolling walker with 5" wheels;3in1 (PT)    Recommendations for Other Services       Precautions / Restrictions Precautions Precautions: Knee Restrictions RLE Weight Bearing: Weight bearing as tolerated    Mobility  Bed Mobility Overal bed mobility: Modified Independent                Transfers Overall transfer level: Needs assistance Equipment used: Rolling walker (2 wheeled) Transfers: Sit to/from Stand Sit to Stand: Supervision         General transfer comment: cues for safe transfer technique  Ambulation/Gait Ambulation/Gait assistance: Supervision Gait Distance (Feet): 200 Feet Assistive device: Rolling walker (2 wheeled) Gait Pattern/deviations: Step-through pattern;Decreased dorsiflexion - right     General Gait Details: R foot drop, increased hip and knee flexion to clear foot. Pt unable to actively contract anterior tibalis muscle.    Stairs Stairs: Yes Stairs assistance: Supervision Stair Management: Two rails;Step to pattern;Forwards Number of Stairs: 3     Wheelchair Mobility    Modified Rankin (Stroke Patients Only)        Balance Overall balance assessment: No apparent balance deficits (not formally assessed)                                          Cognition Arousal/Alertness: Awake/alert Behavior During Therapy: WFL for tasks assessed/performed Overall Cognitive Status: Within Functional Limits for tasks assessed                                 General Comments: Pt reports he has been walking with RW and his family in the halls.      Exercises Total Joint Exercises Ankle Circles/Pumps: AROM;Strengthening;Both;Supine;15 reps;AAROM;Right;Seated;Limitations Ankle Circles/Pumps Limitations: R anterior tibalis 0/5, educated on AAROM DF/PF/IV/EV Quad Sets: AROM Heel Slides: AROM;Right;15 reps;Supine Hip ABduction/ADduction: AROM;Strengthening;Right;15 reps;Supine Straight Leg Raises: AROM;Strengthening;Right;15 reps;Supine Long Arc Quad: AROM;Strengthening;Right;Seated;15 reps Knee Flexion: AROM;Strengthening;Right;Seated;15 reps Goniometric ROM: 80*    General Comments        Pertinent Vitals/Pain Pain Assessment: 0-10 Pain Score: 2  Pain Location: right knee Pain Descriptors / Indicators: Discomfort;Sore Pain Intervention(s): Limited activity within patient's tolerance;Repositioned;Monitored during session    Home Living                      Prior Function            PT Goals (current goals can now be found in the care plan section) Progress towards PT goals: Progressing toward goals  Frequency    7X/week      PT Plan Current plan remains appropriate    Co-evaluation              AM-PAC PT "6 Clicks" Mobility   Outcome Measure  Help needed turning from your back to your side while in a flat bed without using bedrails?: None Help needed moving from lying on your back to sitting on the side of a flat bed without using bedrails?: None Help needed moving to and from a bed to a chair (including a wheelchair)?: None Help  needed standing up from a chair using your arms (e.g., wheelchair or bedside chair)?: None Help needed to walk in hospital room?: None Help needed climbing 3-5 steps with a railing? : A Little 6 Click Score: 23    End of Session   Activity Tolerance: Patient tolerated treatment well Patient left: in bed;with call bell/phone within reach Nurse Communication: Mobility status PT Visit Diagnosis: Other abnormalities of gait and mobility (R26.89);Pain Pain - Right/Left: Right Pain - part of body: Knee     Time: 0802-0827 PT Time Calculation (min) (ACUTE ONLY): 25 min  Charges:  $Gait Training: 8-22 mins $Therapeutic Exercise: 8-22 mins                     Lilyan Punt, PT   Greggory Stallion 08/12/2018, 10:13 AM
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
## 2018-08-12 NOTE — Progress Notes (Addendum)
Subjective: 1 Day Post-Op Procedure(s) (LRB): RIGHT TOTAL KNEE ARTHROPLASTY (Right) Patient reports pain as mild. Feeling well this am.   Objective: Vital signs in last 24 hours: Temp:  [97.2 F (36.2 C)-98.9 F (37.2 C)] 98.5 F (36.9 C) (11/26 0220) Pulse Rate:  [47-60] 57 (11/26 0220) Resp:  [10-25] 18 (11/26 0220) BP: (109-154)/(69-93) 148/84 (11/26 0220) SpO2:  [94 %-100 %] 97 % (11/26 0220) Weight:  [86.3 kg] 86.3 kg (11/25 1404)  Intake/Output from previous day: 11/25 0701 - 11/26 0700 In: 1716.7 [P.O.:360; I.V.:1156.7; IV Piggyback:200] Out: 875 [Urine:775; Blood:100] Intake/Output this shift: No intake/output data recorded.  Recent Labs    08/12/18 0407  HGB 12.3*   Recent Labs    08/12/18 0407  WBC 5.5  RBC 5.34  HCT 40.7  PLT 176   Recent Labs    08/12/18 0407  NA 135  K 4.1  CL 105  CO2 26  BUN 6  CREATININE 1.50*  GLUCOSE 119*  CALCIUM 8.3*   No results for input(s): LABPT, INR in the last 72 hours.  Neurologically intact Neurovascular intact Sensation intact distally Intact pulses distally Dorsiflexion/Plantar flexion intact Incision: moderate drainage No cellulitis present Compartment soft  Anticipated LOS equal to or greater than 2 midnights due to - Age 58 and older with one or more of the following:  - Obesity  - Expected need for hospital services (PT, OT, Nursing) required for safe  discharge  - Anticipated need for postoperative skilled nursing care or inpatient rehab  - Active co-morbidities: Coronary Artery Disease OR   - Unanticipated findings during/Post Surgery: Slow post-op progression: GI, pain control, mobility  - Patient is a high risk of re-admission due to: None   Assessment/Plan: 1 Day Post-Op Procedure(s) (LRB): RIGHT TOTAL KNEE ARTHROPLASTY (Right) Advance diet Up with therapy Discharge home with home health after second PT session  WBAT RLE Dressing changed by me today Please apply thigh-high ted hose  to RLE ABLA-mild and stable Right foot drop noted with PT today.  Will order AFO    Cristie HemMary L Keilon Ressel 08/12/2018, 7:32 AM

## 2018-08-12 NOTE — Care Management Note (Signed)
Case Management Note  Patient Details  Name: [name] MRN: [id number] Date of Birth: April 28, 1961  Subjective/Objective:  57 yr old gentleman s/p right total knee arthroplasty.                  Action/Plan: Case manager spoke with patient concerning discharge plan and DME. Patient was preoperatively setup with [facility], no changes. Will have family support at discharge.   Expected Discharge Date:  08/12/18               Expected Discharge Plan:  Home w Home Health Services  In-House Referral:  NA  Discharge planning Services  CM Consult  Post Acute Care Choice:  Home Health, Durable Medical Equipment Choice offered to:  Patient  DME Arranged:  3-N-1, Walker rolling DME Agency:  [facility]  HH Arranged:  PT HH Agency:  [facility]  Status of Service:  Completed, signed off  If discussed at [employer] Meetings, dates discussed:    Additional Comments:  Durenda GuthrieBrady, Keidy Thurgood Naomi, RN 08/12/2018, 1:51 PM

## 2018-08-13 ENCOUNTER — Telehealth (INDEPENDENT_AMBULATORY_CARE_PROVIDER_SITE_OTHER): Payer: Self-pay | Admitting: *Deleted

## 2018-08-13 NOTE — Telephone Encounter (Signed)
Matthias HughsKecia from [facility] called and stated that pt will be out on 08/15/18 with at home orders. Informed Dr. Roda ShuttersXu.

## 2018-08-18 ENCOUNTER — Telehealth (INDEPENDENT_AMBULATORY_CARE_PROVIDER_SITE_OTHER): Payer: Self-pay | Admitting: Orthopaedic Surgery

## 2018-08-18 NOTE — Telephone Encounter (Signed)
Ok on orders.  

## 2018-08-18 NOTE — Telephone Encounter (Signed)
yes

## 2018-08-18 NOTE — Telephone Encounter (Signed)
Joey from [facility] called to [facility] following:  1x a week for 1 week 3x a week for 1 week 1x a week for 1 week.  CB#[phone].  Thank you.

## 2018-08-19 ENCOUNTER — Other Ambulatory Visit (INDEPENDENT_AMBULATORY_CARE_PROVIDER_SITE_OTHER): Payer: Self-pay

## 2018-08-19 ENCOUNTER — Telehealth (INDEPENDENT_AMBULATORY_CARE_PROVIDER_SITE_OTHER): Payer: Self-pay | Admitting: Orthopaedic Surgery

## 2018-08-19 MED ORDER — OXYCODONE HCL 5 MG PO TABS: ORAL_TABLET | ORAL | 0 refills | Status: DC

## 2018-08-19 NOTE — Telephone Encounter (Signed)
Refill oxycodone 5 mg.  5-10 mg tid prn #30

## 2018-08-19 NOTE — Telephone Encounter (Signed)
Patient called to request a refill  oxycodone 10mg  Oxycodone 5mg   Please call patient to advise [phone]

## 2018-08-19 NOTE — Telephone Encounter (Signed)
See message below  Please advise

## 2018-08-19 NOTE — Telephone Encounter (Signed)
Called Joey back no answer LMOM to return call.

## 2018-08-19 NOTE — Telephone Encounter (Signed)
rx ready for pick up patient aware.

## 2018-08-19 NOTE — Telephone Encounter (Signed)
Patient called needing Rx refilled (Oxycodone) Patient said he is out of his medication. The number to contact patient is [phone]

## 2018-08-19 NOTE — Telephone Encounter (Signed)
Called [name] no answer LMOM to return call. Just wanted to approve on orders below.

## 2018-08-19 NOTE — Telephone Encounter (Signed)
Rx ready for pick up. 

## 2018-08-20 NOTE — Telephone Encounter (Signed)
3rd time I call [name] no answer. Just need to approve on orders.

## 2018-08-22 ENCOUNTER — Telehealth (INDEPENDENT_AMBULATORY_CARE_PROVIDER_SITE_OTHER): Payer: Self-pay | Admitting: Orthopaedic Surgery

## 2018-08-22 NOTE — Telephone Encounter (Signed)
[facility] [place] [facility]. No fax number provided spoke with wife and PT, patient will be discharged on Monday.

## 2018-08-26 ENCOUNTER — Other Ambulatory Visit (INDEPENDENT_AMBULATORY_CARE_PROVIDER_SITE_OTHER): Payer: Self-pay | Admitting: Orthopaedic Surgery

## 2018-08-26 ENCOUNTER — Ambulatory Visit (INDEPENDENT_AMBULATORY_CARE_PROVIDER_SITE_OTHER): Payer: 59 | Admitting: Orthopaedic Surgery

## 2018-08-26 ENCOUNTER — Encounter (INDEPENDENT_AMBULATORY_CARE_PROVIDER_SITE_OTHER): Payer: Self-pay | Admitting: Orthopaedic Surgery

## 2018-08-26 DIAGNOSIS — Z96651 Presence of right artificial knee joint: Secondary | ICD-10-CM

## 2018-08-26 MED ORDER — OXYCODONE-ACETAMINOPHEN 5-325 MG PO TABS: 1.0000 | ORAL_TABLET | Freq: Three times a day (TID) | ORAL | 0 refills | Status: DC | PRN

## 2018-08-26 MED ORDER — METHOCARBAMOL 500 MG PO TABS: 500.0000 mg | ORAL_TABLET | Freq: Four times a day (QID) | ORAL | 2 refills | Status: DC | PRN

## 2018-08-26 NOTE — Telephone Encounter (Signed)
Ok to do

## 2018-08-26 NOTE — Telephone Encounter (Signed)
yes

## 2018-08-26 NOTE — Telephone Encounter (Signed)
Order entered into epic
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
## 2018-09-05 ENCOUNTER — Telehealth (INDEPENDENT_AMBULATORY_CARE_PROVIDER_SITE_OTHER): Payer: Self-pay | Admitting: Orthopaedic Surgery

## 2018-09-05 MED ORDER — OXYCODONE-ACETAMINOPHEN 5-325 MG PO TABS: 1.0000 | ORAL_TABLET | Freq: Two times a day (BID) | ORAL | 0 refills | Status: DC | PRN

## 2018-09-05 NOTE — Telephone Encounter (Signed)
Please advise 

## 2018-09-05 NOTE — Telephone Encounter (Signed)
Patient left a voicemail message requesting an RX refill on his Oxycodone-Acetaminophen.  CB#[phone].  Thank you.

## 2018-09-11 ENCOUNTER — Telehealth (INDEPENDENT_AMBULATORY_CARE_PROVIDER_SITE_OTHER): Payer: Self-pay | Admitting: Orthopedic Surgery

## 2018-09-11 NOTE — Telephone Encounter (Signed)
He should come in to discuss and get xrays so that we can determine if he is a partial knee replacement candidate or not

## 2018-09-11 NOTE — Telephone Encounter (Signed)
Patient has scheduled appt in jan 2020. Would like to discuss then.

## 2018-09-11 NOTE — Telephone Encounter (Signed)
Please call patient

## 2018-09-11 NOTE — Telephone Encounter (Signed)
[name] is s/p right total knee replacement.  He called in this morning to discuss scheduling his left knee.  He does not feel he needs a total knee, maybe just a partial.  Please call patient to discuss. Thank you!

## 2018-09-19 ENCOUNTER — Other Ambulatory Visit (INDEPENDENT_AMBULATORY_CARE_PROVIDER_SITE_OTHER): Payer: Self-pay | Admitting: Orthopaedic Surgery

## 2018-09-19 NOTE — Telephone Encounter (Signed)
Ok on Fallon and promethazine but he should have enough aspirin to last him 6 weeks after surgery

## 2018-09-22 NOTE — Telephone Encounter (Signed)
What's the question?

## 2018-09-23 ENCOUNTER — Ambulatory Visit (INDEPENDENT_AMBULATORY_CARE_PROVIDER_SITE_OTHER): Payer: 59 | Admitting: Orthopaedic Surgery

## 2018-09-23 ENCOUNTER — Encounter (INDEPENDENT_AMBULATORY_CARE_PROVIDER_SITE_OTHER): Payer: Self-pay | Admitting: Orthopaedic Surgery

## 2018-09-23 ENCOUNTER — Telehealth (INDEPENDENT_AMBULATORY_CARE_PROVIDER_SITE_OTHER): Payer: Self-pay

## 2018-09-23 ENCOUNTER — Ambulatory Visit (INDEPENDENT_AMBULATORY_CARE_PROVIDER_SITE_OTHER): Payer: 59

## 2018-09-23 DIAGNOSIS — M25561 Pain in right knee: Secondary | ICD-10-CM | POA: Diagnosis not present

## 2018-09-23 DIAGNOSIS — G8929 Other chronic pain: Secondary | ICD-10-CM

## 2018-09-23 DIAGNOSIS — Z96651 Presence of right artificial knee joint: Secondary | ICD-10-CM

## 2018-09-23 MED ORDER — HYDROCODONE-ACETAMINOPHEN 5-325 MG PO TABS: 1.0000 | ORAL_TABLET | Freq: Every evening | ORAL | 0 refills | Status: DC | PRN
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
## 2018-09-23 NOTE — Telephone Encounter (Signed)
Faxed work note to [employer] per patients request 

## 2018-09-25 ENCOUNTER — Other Ambulatory Visit (INDEPENDENT_AMBULATORY_CARE_PROVIDER_SITE_OTHER): Payer: Self-pay

## 2018-09-25 MED ORDER — PROMETHAZINE HCL 25 MG PO TABS: 25.0000 mg | ORAL_TABLET | Freq: Four times a day (QID) | ORAL | 1 refills | Status: DC | PRN

## 2018-09-25 MED ORDER — METHOCARBAMOL 750 MG PO TABS: 750.0000 mg | ORAL_TABLET | Freq: Two times a day (BID) | ORAL | 0 refills | Status: DC | PRN

## 2018-10-31 ENCOUNTER — Telehealth (INDEPENDENT_AMBULATORY_CARE_PROVIDER_SITE_OTHER): Payer: Self-pay | Admitting: Orthopaedic Surgery

## 2018-10-31 NOTE — Telephone Encounter (Signed)
Called patient to let him know we have not received paperwork. They will refax paperwork.

## 2018-10-31 NOTE — Telephone Encounter (Signed)
Patient left a message wanting to know if Dr. Roda Shutters has received his Long Term Disability paperwork that was suppose to faxed to him.  CB#[phone].  Thank you.

## 2018-11-04 ENCOUNTER — Encounter (INDEPENDENT_AMBULATORY_CARE_PROVIDER_SITE_OTHER): Payer: Self-pay | Admitting: Orthopaedic Surgery

## 2018-11-04 ENCOUNTER — Encounter (INDEPENDENT_AMBULATORY_CARE_PROVIDER_SITE_OTHER): Payer: Self-pay

## 2018-11-04 ENCOUNTER — Ambulatory Visit (INDEPENDENT_AMBULATORY_CARE_PROVIDER_SITE_OTHER): Payer: 59 | Admitting: Orthopaedic Surgery

## 2018-11-04 VITALS — Ht 69.0 in | Wt 189.0 lb

## 2018-11-04 DIAGNOSIS — M1712 Unilateral primary osteoarthritis, left knee: Secondary | ICD-10-CM

## 2018-11-04 DIAGNOSIS — Z96651 Presence of right artificial knee joint: Secondary | ICD-10-CM
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
## 2019-01-22 ENCOUNTER — Telehealth: Payer: Self-pay | Admitting: Orthopaedic Surgery

## 2019-01-22 ENCOUNTER — Other Ambulatory Visit: Payer: Self-pay | Admitting: Orthopaedic Surgery

## 2019-01-22 NOTE — Telephone Encounter (Signed)
Patient called needing Rx refilled (Methocarbamol). Patient uses the [facility] in [place]. The number to contact patient is [phone]

## 2019-01-22 NOTE — Telephone Encounter (Signed)
Please advise 

## 2019-01-23 NOTE — Telephone Encounter (Signed)
Yes sent into pharm

## 2019-01-23 NOTE — Telephone Encounter (Signed)
I think I just called this in?

## 2019-01-23 NOTE — Telephone Encounter (Signed)
SEE MESSAGE

## 2019-01-23 NOTE — Telephone Encounter (Signed)
Patient aware.

## 2019-02-03 ENCOUNTER — Encounter: Payer: Self-pay | Admitting: Orthopaedic Surgery

## 2019-02-03 ENCOUNTER — Ambulatory Visit (INDEPENDENT_AMBULATORY_CARE_PROVIDER_SITE_OTHER): Payer: 59 | Admitting: Orthopaedic Surgery

## 2019-02-03 ENCOUNTER — Ambulatory Visit: Payer: 59

## 2019-02-03 ENCOUNTER — Other Ambulatory Visit: Payer: Self-pay

## 2019-02-03 DIAGNOSIS — Z96651 Presence of right artificial knee joint: Secondary | ICD-10-CM | POA: Diagnosis not present

## 2019-02-03 DIAGNOSIS — G8929 Other chronic pain: Secondary | ICD-10-CM | POA: Diagnosis not present

## 2019-02-03 DIAGNOSIS — M25562 Pain in left knee: Secondary | ICD-10-CM | POA: Diagnosis not present
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
## 2019-02-12 ENCOUNTER — Telehealth: Payer: Self-pay

## 2019-02-12 NOTE — Telephone Encounter (Signed)
Patient would like letter mailed to him stating his next appt. Will mail out today

## 2019-08-06 ENCOUNTER — Ambulatory Visit: Payer: 59 | Admitting: Orthopaedic Surgery
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
## 2020-09-15 DIAGNOSIS — F319 Bipolar disorder, unspecified: Secondary | ICD-10-CM | POA: Diagnosis not present
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
## 2020-10-24 IMAGING — DX DG KNEE 1-2V PORT*R*
1 series · 2 of 2 positions shown · non-contrast
Comparison: Radiographs May 07, 2018.

CLINICAL DATA: Status post right total knee arthroplasty.

EXAM:
PORTABLE RIGHT KNEE - 1-2 VIEW

[Series 1: knee · 0.14mm/px · 2 of 2 slices shown]
[im 1/2]
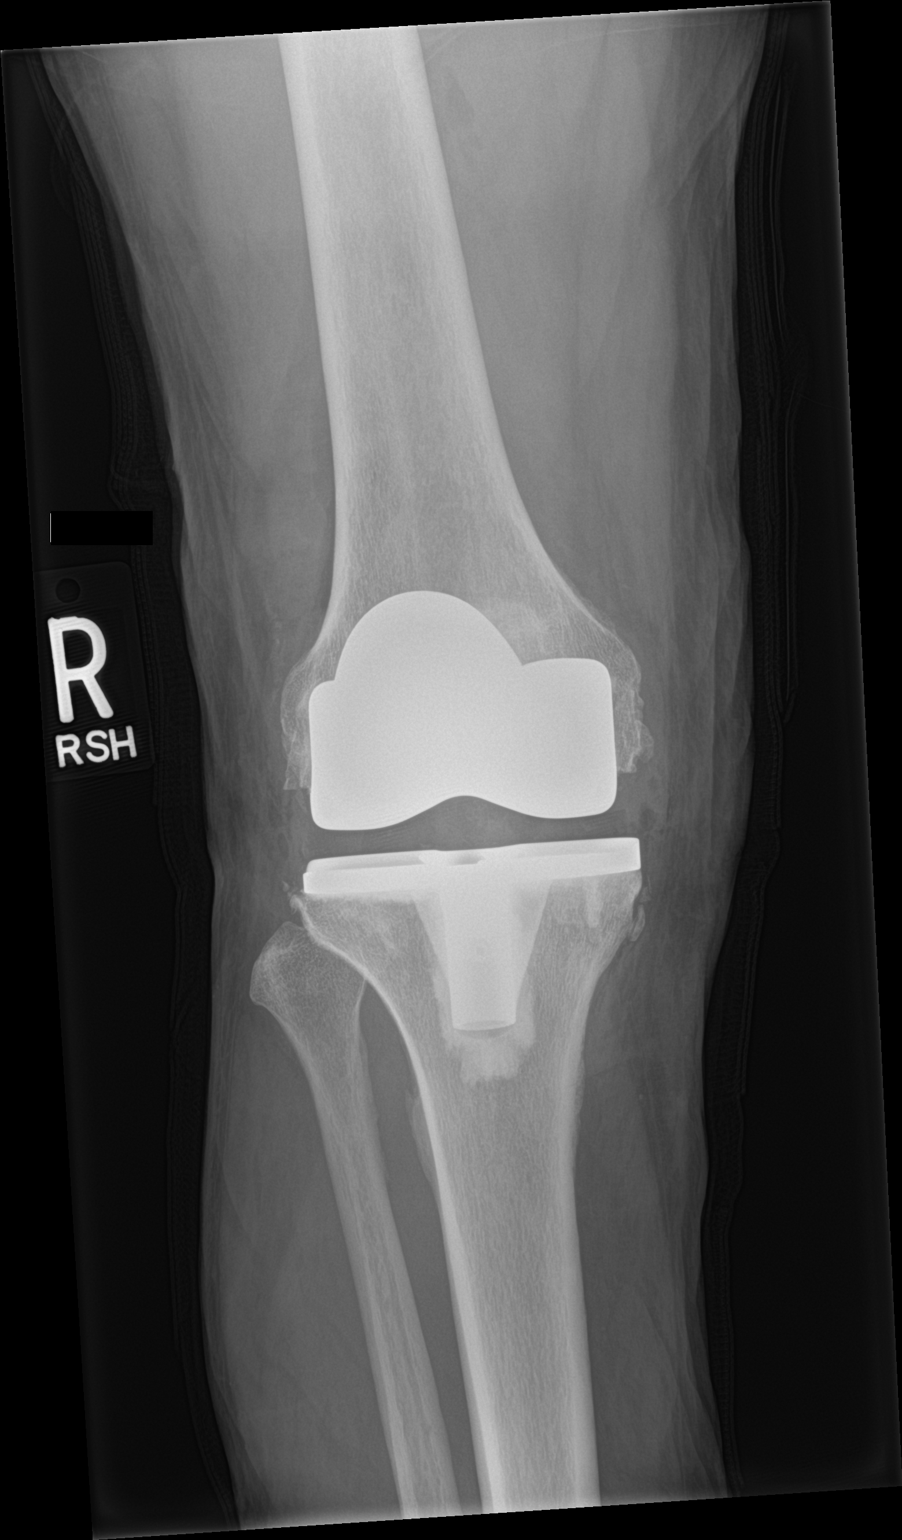
[im 2/2]
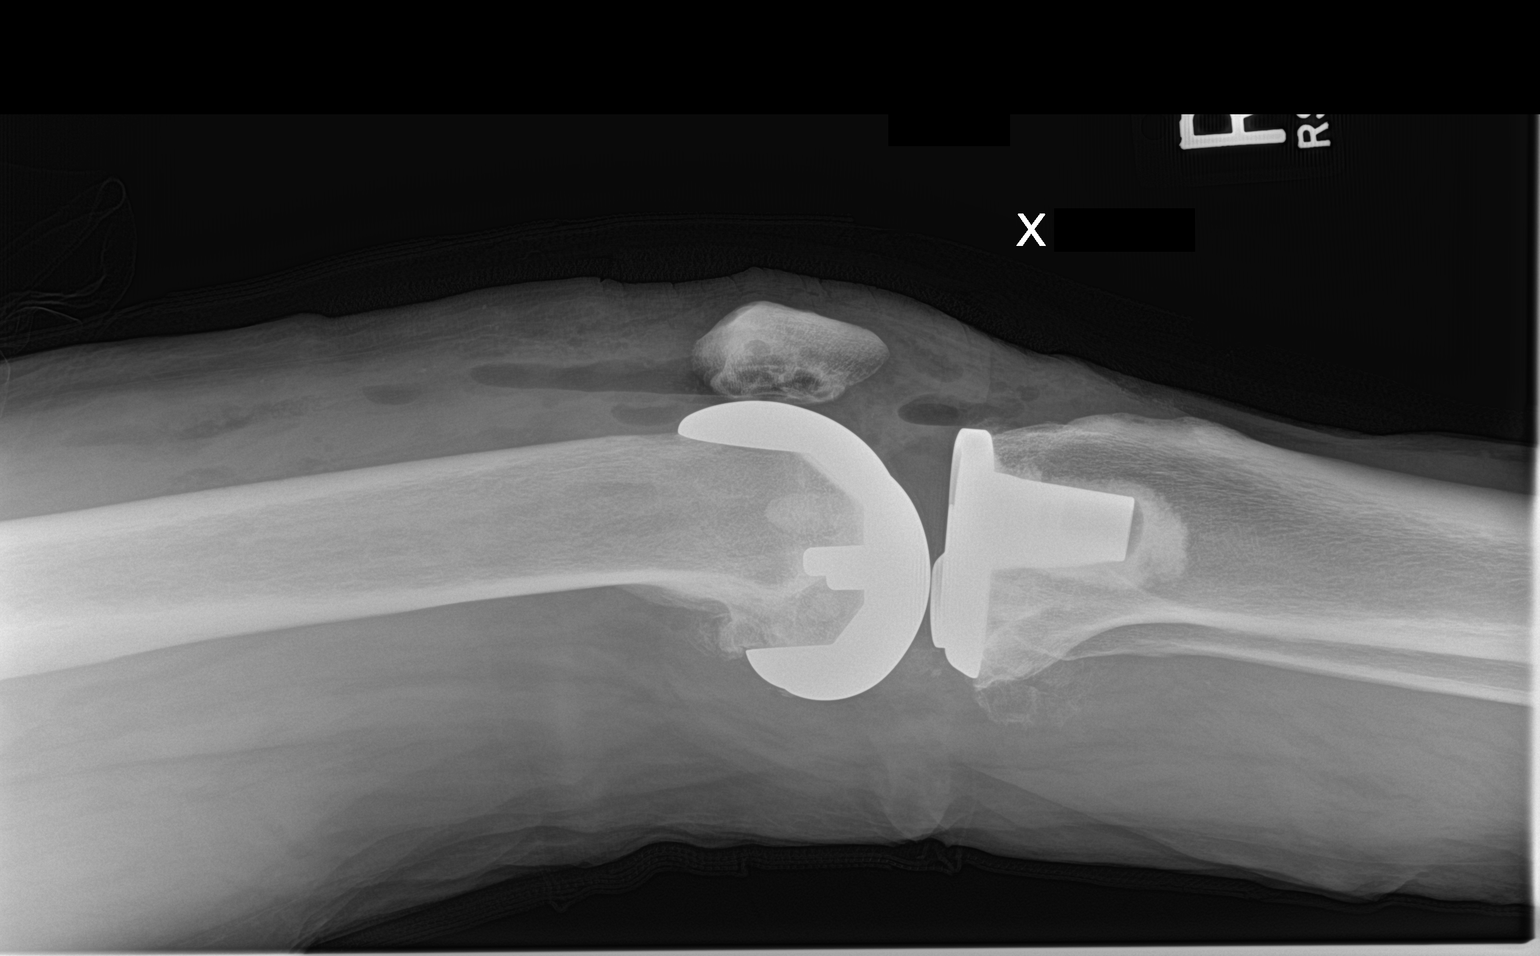

[2 of 2 positions shown; findings below may reference images not displayed]

FINDINGS: The femoral and tibial components appear to be well situated. No
fracture or dislocation is noted. Expected postoperative changes are
noted in the soft tissues anteriorly.
IMPRESSION: Status post right total knee arthroplasty.

## 2020-11-29 DIAGNOSIS — L2084 Intrinsic (allergic) eczema: Secondary | ICD-10-CM | POA: Diagnosis not present

## 2020-12-08 DIAGNOSIS — L2084 Intrinsic (allergic) eczema: Secondary | ICD-10-CM | POA: Diagnosis not present

## 2021-10-16 DIAGNOSIS — J90 Pleural effusion, not elsewhere classified: Secondary | ICD-10-CM | POA: Diagnosis not present

## 2021-10-16 DIAGNOSIS — R0981 Nasal congestion: Secondary | ICD-10-CM | POA: Diagnosis not present

## 2021-10-16 DIAGNOSIS — R413 Other amnesia: Secondary | ICD-10-CM | POA: Diagnosis not present

## 2021-10-16 DIAGNOSIS — Z0000 Encounter for general adult medical examination without abnormal findings: Secondary | ICD-10-CM | POA: Diagnosis not present

## 2021-10-16 DIAGNOSIS — R053 Chronic cough: Secondary | ICD-10-CM | POA: Diagnosis not present

## 2021-10-16 DIAGNOSIS — R059 Cough, unspecified: Secondary | ICD-10-CM | POA: Diagnosis not present

## 2021-10-16 DIAGNOSIS — F319 Bipolar disorder, unspecified: Secondary | ICD-10-CM | POA: Diagnosis not present

## 2021-10-16 DIAGNOSIS — R918 Other nonspecific abnormal finding of lung field: Secondary | ICD-10-CM | POA: Diagnosis not present

## 2021-10-16 DIAGNOSIS — Z23 Encounter for immunization: Secondary | ICD-10-CM | POA: Diagnosis not present

## 2022-02-15 HISTORY — PX: ESOPHAGOGASTRODUODENOSCOPY: SHX1529

## 2022-02-15 HISTORY — PX: COLONOSCOPY: SHX174

## 2022-03-09 ENCOUNTER — Ambulatory Visit: Payer: Self-pay | Admitting: Urology
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
## 2022-04-17 ENCOUNTER — Ambulatory Visit (INDEPENDENT_AMBULATORY_CARE_PROVIDER_SITE_OTHER): Payer: Self-pay | Admitting: Orthopaedic Surgery

## 2022-04-17 DIAGNOSIS — M1712 Unilateral primary osteoarthritis, left knee: Secondary | ICD-10-CM | POA: Insufficient documentation
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
## 2022-04-18 NOTE — Progress Notes (Signed)
No show

## 2022-09-05 ENCOUNTER — Encounter (HOSPITAL_COMMUNITY): Payer: Self-pay

## 2022-09-05 ENCOUNTER — Inpatient Hospital Stay (HOSPITAL_COMMUNITY)
Admission: EM | Admit: 2022-09-05 | Discharge: 2022-10-28 | DRG: 917 | Disposition: A | Discharge status: Hospice | Payer: Medicare Other | Attending: Internal Medicine | Admitting: Internal Medicine

## 2022-09-05 ENCOUNTER — Emergency Department (HOSPITAL_COMMUNITY): Payer: Medicare Other

## 2022-09-05 ENCOUNTER — Other Ambulatory Visit: Payer: Self-pay

## 2022-09-05 DIAGNOSIS — T56894A Toxic effect of other metals, undetermined, initial encounter: Secondary | ICD-10-CM | POA: Diagnosis not present

## 2022-09-05 DIAGNOSIS — R799 Abnormal finding of blood chemistry, unspecified: Secondary | ICD-10-CM

## 2022-09-05 DIAGNOSIS — K7689 Other specified diseases of liver: Secondary | ICD-10-CM | POA: Diagnosis present

## 2022-09-05 DIAGNOSIS — Y92009 Unspecified place in unspecified non-institutional (private) residence as the place of occurrence of the external cause: Secondary | ICD-10-CM

## 2022-09-05 DIAGNOSIS — R1310 Dysphagia, unspecified: Secondary | ICD-10-CM

## 2022-09-05 DIAGNOSIS — Z811 Family history of alcohol abuse and dependence: Secondary | ICD-10-CM

## 2022-09-05 DIAGNOSIS — Z515 Encounter for palliative care: Secondary | ICD-10-CM

## 2022-09-05 DIAGNOSIS — Z8249 Family history of ischemic heart disease and other diseases of the circulatory system: Secondary | ICD-10-CM

## 2022-09-05 DIAGNOSIS — Z7982 Long term (current) use of aspirin: Secondary | ICD-10-CM

## 2022-09-05 DIAGNOSIS — Z781 Physical restraint status: Secondary | ICD-10-CM

## 2022-09-05 DIAGNOSIS — Z681 Body mass index (BMI) 19 or less, adult: Secondary | ICD-10-CM

## 2022-09-05 DIAGNOSIS — Z66 Do not resuscitate: Secondary | ICD-10-CM | POA: Diagnosis not present

## 2022-09-05 DIAGNOSIS — N400 Enlarged prostate without lower urinary tract symptoms: Secondary | ICD-10-CM | POA: Diagnosis present

## 2022-09-05 DIAGNOSIS — Z1152 Encounter for screening for COVID-19: Secondary | ICD-10-CM

## 2022-09-05 DIAGNOSIS — K5641 Fecal impaction: Secondary | ICD-10-CM | POA: Diagnosis present

## 2022-09-05 DIAGNOSIS — Z560 Unemployment, unspecified: Secondary | ICD-10-CM

## 2022-09-05 DIAGNOSIS — Z7189 Other specified counseling: Secondary | ICD-10-CM

## 2022-09-05 DIAGNOSIS — Z96651 Presence of right artificial knee joint: Secondary | ICD-10-CM | POA: Diagnosis present

## 2022-09-05 DIAGNOSIS — F03C18 Unspecified dementia, severe, with other behavioral disturbance: Secondary | ICD-10-CM | POA: Diagnosis present

## 2022-09-05 DIAGNOSIS — R9431 Abnormal electrocardiogram [ECG] [EKG]: Secondary | ICD-10-CM | POA: Diagnosis present

## 2022-09-05 DIAGNOSIS — G9341 Metabolic encephalopathy: Secondary | ICD-10-CM | POA: Diagnosis present

## 2022-09-05 DIAGNOSIS — R627 Adult failure to thrive: Secondary | ICD-10-CM | POA: Diagnosis present

## 2022-09-05 DIAGNOSIS — D72829 Elevated white blood cell count, unspecified: Secondary | ICD-10-CM | POA: Diagnosis present

## 2022-09-05 DIAGNOSIS — F319 Bipolar disorder, unspecified: Secondary | ICD-10-CM | POA: Diagnosis present

## 2022-09-05 DIAGNOSIS — R5381 Other malaise: Secondary | ICD-10-CM | POA: Diagnosis present

## 2022-09-05 DIAGNOSIS — T43591A Poisoning by other antipsychotics and neuroleptics, accidental (unintentional), initial encounter: Principal | ICD-10-CM | POA: Diagnosis present

## 2022-09-05 DIAGNOSIS — B3781 Candidal esophagitis: Secondary | ICD-10-CM | POA: Diagnosis not present

## 2022-09-05 DIAGNOSIS — F03918 Unspecified dementia, unspecified severity, with other behavioral disturbance: Secondary | ICD-10-CM | POA: Diagnosis present

## 2022-09-05 DIAGNOSIS — E871 Hypo-osmolality and hyponatremia: Secondary | ICD-10-CM | POA: Diagnosis present

## 2022-09-05 DIAGNOSIS — Z79899 Other long term (current) drug therapy: Secondary | ICD-10-CM

## 2022-09-05 DIAGNOSIS — R4182 Altered mental status, unspecified: Secondary | ICD-10-CM

## 2022-09-05 DIAGNOSIS — T56891A Toxic effect of other metals, accidental (unintentional), initial encounter: Secondary | ICD-10-CM | POA: Diagnosis present

## 2022-09-05 DIAGNOSIS — R64 Cachexia: Secondary | ICD-10-CM | POA: Diagnosis present

## 2022-09-05 DIAGNOSIS — Z8673 Personal history of transient ischemic attack (TIA), and cerebral infarction without residual deficits: Secondary | ICD-10-CM

## 2022-09-05 DIAGNOSIS — E785 Hyperlipidemia, unspecified: Secondary | ICD-10-CM | POA: Diagnosis present

## 2022-09-05 DIAGNOSIS — E43 Unspecified severe protein-calorie malnutrition: Secondary | ICD-10-CM | POA: Insufficient documentation

## 2022-09-05 DIAGNOSIS — I110 Hypertensive heart disease with heart failure: Secondary | ICD-10-CM | POA: Diagnosis present

## 2022-09-05 DIAGNOSIS — F03C3 Unspecified dementia, severe, with mood disturbance: Secondary | ICD-10-CM | POA: Diagnosis present

## 2022-09-05 DIAGNOSIS — E162 Hypoglycemia, unspecified: Secondary | ICD-10-CM | POA: Diagnosis not present

## 2022-09-05 DIAGNOSIS — I2510 Atherosclerotic heart disease of native coronary artery without angina pectoris: Secondary | ICD-10-CM | POA: Diagnosis present

## 2022-09-05 DIAGNOSIS — E870 Hyperosmolality and hypernatremia: Secondary | ICD-10-CM | POA: Diagnosis present

## 2022-09-05 DIAGNOSIS — I708 Atherosclerosis of other arteries: Secondary | ICD-10-CM | POA: Diagnosis present

## 2022-09-05 DIAGNOSIS — G4710 Hypersomnia, unspecified: Secondary | ICD-10-CM | POA: Diagnosis present

## 2022-09-05 DIAGNOSIS — I5022 Chronic systolic (congestive) heart failure: Secondary | ICD-10-CM | POA: Diagnosis present

## 2022-09-05 DIAGNOSIS — Z83438 Family history of other disorder of lipoprotein metabolism and other lipidemia: Secondary | ICD-10-CM

## 2022-09-05 DIAGNOSIS — Z87891 Personal history of nicotine dependence: Secondary | ICD-10-CM

## 2022-09-05 DIAGNOSIS — Z806 Family history of leukemia: Secondary | ICD-10-CM

## 2022-09-05 DIAGNOSIS — F1290 Cannabis use, unspecified, uncomplicated: Secondary | ICD-10-CM | POA: Diagnosis present

## 2022-09-05 HISTORY — DX: Cerebral infarction, unspecified: I63.9

## 2022-09-05 HISTORY — DX: Unspecified dementia, unspecified severity, without behavioral disturbance, psychotic disturbance, mood disturbance, and anxiety: F03.90

## 2022-09-05 LAB — COMPREHENSIVE METABOLIC PANEL
ALT: 25 U/L (ref 0–44)
AST: 21 U/L (ref 15–41)
Albumin: 4.1 g/dL (ref 3.5–5.0)
Alkaline Phosphatase: 193 U/L — ABNORMAL HIGH (ref 38–126)
Anion gap: 7 (ref 5–15)
BUN: 24 mg/dL — ABNORMAL HIGH (ref 8–23)
CO2: 28 mmol/L (ref 22–32)
Calcium: 10.9 mg/dL — ABNORMAL HIGH (ref 8.9–10.3)
Chloride: 106 mmol/L (ref 98–111)
Creatinine, Ser: 1.24 mg/dL (ref 0.61–1.24)
GFR, Estimated: >60 mL/min (ref >60)
Glucose, Bld: 124 mg/dL — ABNORMAL HIGH (ref 70–99)
Potassium: 4.5 mmol/L (ref 3.5–5.1)
Sodium: 141 mmol/L (ref 135–145)
Total Bilirubin: 0.5 mg/dL (ref 0.3–1.2)
Total Protein: 8.3 g/dL — ABNORMAL HIGH (ref 6.5–8.1)

## 2022-09-05 LAB — DIFFERENTIAL
Abs Immature Granulocytes: 0.06 10*3/uL (ref 0.00–0.07)
Basophils Absolute: 0.1 10*3/uL (ref 0.0–0.1)
Basophils Relative: 1 %
Eosinophils Absolute: 0.1 10*3/uL (ref 0.0–0.5)
Eosinophils Relative: 0 %
Immature Granulocytes: 0 %
Lymphocytes Relative: 7 %
Lymphs Abs: 1.2 10*3/uL (ref 0.7–4.0)
Monocytes Absolute: 0.7 10*3/uL (ref 0.1–1.0)
Monocytes Relative: 4 %
Neutro Abs: 14.9 10*3/uL — ABNORMAL HIGH (ref 1.7–7.7)
Neutrophils Relative %: 88 %

## 2022-09-05 LAB — CBC
HCT: 53 % — ABNORMAL HIGH (ref 39.0–52.0)
Hemoglobin: 15.7 g/dL (ref 13.0–17.0)
MCH: 24.3 pg — ABNORMAL LOW (ref 26.0–34.0)
MCHC: 29.6 g/dL — ABNORMAL LOW (ref 30.0–36.0)
MCV: 82.2 fL (ref 80.0–100.0)
Platelets: 214 10*3/uL (ref 150–400)
RBC: 6.45 MIL/uL — ABNORMAL HIGH (ref 4.22–5.81)
RDW: 16.7 % — ABNORMAL HIGH (ref 11.5–15.5)
WBC: 17 10*3/uL — ABNORMAL HIGH (ref 4.0–10.5)
nRBC: 0 % (ref 0.0–0.2)

## 2022-09-05 LAB — ETHANOL: Alcohol, Ethyl (B): <10 mg/dL (ref <10)

## 2022-09-05 LAB — RESP PANEL BY RT-PCR (RSV, FLU A&B, COVID)  RVPGX2
Influenza A by PCR: NEGATIVE
Influenza B by PCR: NEGATIVE
Resp Syncytial Virus by PCR: NEGATIVE
SARS Coronavirus 2 by RT PCR: NEGATIVE

## 2022-09-05 LAB — LITHIUM LEVEL
Lithium Lvl: 1.51 mmol/L (ref 0.60–1.20)
Lithium Lvl: 1.39 mmol/L — ABNORMAL HIGH (ref 0.60–1.20)

## 2022-09-05 LAB — PROTIME-INR
INR: 1.1 (ref 0.8–1.2)
Prothrombin Time: 14 seconds (ref 11.4–15.2)

## 2022-09-05 LAB — MAGNESIUM: Magnesium: 2.2 mg/dL (ref 1.7–2.4)

## 2022-09-05 LAB — LACTIC ACID, PLASMA
Lactic Acid, Venous: 1.5 mmol/L (ref 0.5–1.9)
Lactic Acid, Venous: 1.1 mmol/L (ref 0.5–1.9)

## 2022-09-05 LAB — TROPONIN I (HIGH SENSITIVITY): Troponin I (High Sensitivity): 4 ng/L (ref <18)

## 2022-09-05 LAB — APTT: aPTT: 20 seconds — ABNORMAL LOW (ref 24–36)

## 2022-09-05 MED ORDER — LORAZEPAM 2 MG/ML IJ SOLN: 0.5000 mg | INTRAMUSCULAR | Status: DC | PRN

## 2022-09-05 MED ORDER — LORAZEPAM 2 MG/ML IJ SOLN
1.0000 mg | INTRAMUSCULAR | Status: DC | PRN
  Administered 2022-09-05 – 2022-09-06 (×3): 1 mg via INTRAVENOUS
  Filled 2022-09-05 (×4): qty 1

## 2022-09-05 MED ORDER — SODIUM CHLORIDE 0.9 % IV BOLUS
1000.0000 mL | Freq: Once | INTRAVENOUS | Status: AC
  Administered 2022-09-05: 1000 mL via INTRAVENOUS

## 2022-09-05 MED ORDER — HEPARIN SODIUM (PORCINE) 5000 UNIT/ML IJ SOLN
5000.0000 [IU] | Freq: Three times a day (TID) | INTRAMUSCULAR | Status: DC
  Administered 2022-09-06 – 2022-09-22 (×45): 5000 [IU] via SUBCUTANEOUS
  Filled 2022-09-05 (×43): qty 1

## 2022-09-05 MED ORDER — VORICONAZOLE 200 MG PO TABS
200.0000 mg | ORAL_TABLET | Freq: Once | ORAL | Status: DC
  Filled 2022-09-05: qty 1

## 2022-09-05 MED ORDER — ACETAMINOPHEN 160 MG/5ML PO SOLN: 650.0000 mg | ORAL | Status: DC | PRN

## 2022-09-05 MED ORDER — STROKE: EARLY STAGES OF RECOVERY BOOK
Freq: Once | Status: AC
  Filled 2022-09-05: qty 1

## 2022-09-05 MED ORDER — LACTATED RINGERS IV BOLUS (SEPSIS)
1000.0000 mL | Freq: Once | INTRAVENOUS | Status: AC
  Administered 2022-09-05: 1000 mL via INTRAVENOUS

## 2022-09-05 MED ORDER — ACETAMINOPHEN 650 MG RE SUPP: 650.0000 mg | RECTAL | Status: DC | PRN

## 2022-09-05 MED ORDER — SODIUM CHLORIDE 0.9 % IV SOLN: INTRAVENOUS | Status: DC

## 2022-09-05 MED ORDER — ACETAMINOPHEN 325 MG PO TABS
650.0000 mg | ORAL_TABLET | ORAL | Status: DC | PRN
  Administered 2022-09-22 – 2022-10-09 (×6): 650 mg via ORAL
  Filled 2022-09-05 (×7): qty 2

## 2022-09-05 MED ORDER — NYSTATIN 100000 UNIT/ML MT SUSP
5.0000 mL | Freq: Four times a day (QID) | OROMUCOSAL | Status: DC
  Administered 2022-09-06 – 2022-09-09 (×7): 500000 [IU] via OROMUCOSAL
  Filled 2022-09-05 (×10): qty 5
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
## 2022-09-05 NOTE — ED Provider Notes (Signed)
[facility] EMERGENCY DEPARTMENT Provider Note   CSN: [id number] Arrival date & time: 09/05/22  1221     History  Chief Complaint  Patient presents with   Dysphagia    [name] is a 61 y.o. male with history of bipolar disorder on lithium, CAD, dementia, depression, hyperlipidemia, hypertension, stroke who presents the emergency department with difficulty swallowing for the past 2 days.  Family member states that when he tries to feed himself solids, the contents seem to dribble out of his mouth.  She has not witnessed him vomiting.  He does have history of some esophageal problems, most recently was told that it was related to an infection of thrush in his esophagus.  This morning while he was standing up, he all of a sudden collapsed over.  She does not believe that he lost consciousness, but he seemed "out of it".  She is also noticed he seems slightly more confused than his baseline, that she has not noticed other deficits except for the swallowing.  Patient able to answer yes or no questions, but otherwise speech is incomprehensible.  Family member says that is his baseline.  Family member believes the last time he had his dose of lithium was a few days ago. They had run out of it and the pharmacy had given them a short supply.   Level 5 caveat due to dementia  HPI     Home Medications Prior to Admission medications   Medication Sig Start Date End Date Taking? Authorizing Provider  ascorbic acid (VITAMIN C) 100 MG tablet Take by mouth.   Yes [provider]  aspirin EC 81 MG tablet Take 1 tablet (81 mg total) by mouth 2 (two) times daily. 08/11/18  Yes Tarry Kos, MD  atorvastatin (LIPITOR) 10 MG tablet Take 10 mg by mouth daily.   Yes [provider]  Cholecalciferol 50 MCG (2000 UT) TABS Take by mouth.   Yes [provider]  fluticasone (FLONASE) 50 MCG/ACT nasal spray Place into the nose. 05/14/22 05/14/23 Yes [provider]   lithium carbonate 300 MG capsule Take 300 mg by mouth 2 (two) times daily with a meal.   Yes [provider]  senna-docusate (SENOKOT S) 8.6-50 MG tablet Take 1 tablet by mouth at bedtime as needed. 08/11/18  Yes Tarry Kos, MD  traZODone (DESYREL) 100 MG tablet Take 100 mg by mouth at bedtime.   Yes [provider]      Allergies    Patient has no known allergies.    Review of Systems   Review of Systems  Unable to perform ROS: Dementia  HENT:  Positive for trouble swallowing. Negative for sore throat.   Cardiovascular:  Negative for chest pain.  Gastrointestinal:  Negative for abdominal pain and nausea.  Psychiatric/Behavioral:  Positive for confusion.     Physical Exam Updated Vital Signs BP 109/74   Pulse 66   Temp (!) 97.3 F (36.3 C) (Oral)   Resp 17   Ht 5\' 8"  (1.727 m)   Wt 59.4 kg   SpO2 100%   BMI 19.92 kg/m  Physical Exam Vitals and nursing note reviewed.  Constitutional:      Appearance: Normal appearance.  HENT:     Head: Normocephalic and atraumatic.     Mouth/Throat:     Pharynx: Oropharyngeal exudate present.     Comments: Thick white exudate noted to the oropharynx Eyes:     Conjunctiva/sclera: Conjunctivae normal.  Cardiovascular:  Rate and Rhythm: Normal rate and regular rhythm.  Pulmonary:     Effort: Pulmonary effort is normal. No respiratory distress.     Breath sounds: Normal breath sounds.  Abdominal:     General: There is no distension.     Palpations: Abdomen is soft.     Tenderness: There is no abdominal tenderness.  Skin:    General: Skin is warm and dry.  Neurological:     General: No focal deficit present.     Mental Status: He is confused.     Motor: Tremor present.     Comments: Can answer yes or no questions.  Otherwise speech is incomprehensible.  Can follow all commands, moving all extremities without difficulty.  Tremor in bilateral hands     ED Results / Procedures / Treatments   Labs (all  labs ordered are listed, but only abnormal results are displayed) Labs Reviewed  APTT - Abnormal; Notable for the following components:      Result Value   aPTT 20 (*)    All other components within normal limits  CBC - Abnormal; Notable for the following components:   WBC 17.0 (*)    RBC 6.45 (*)    HCT 53.0 (*)    MCH 24.3 (*)    MCHC 29.6 (*)    RDW 16.7 (*)    All other components within normal limits  DIFFERENTIAL - Abnormal; Notable for the following components:   Neutro Abs 14.9 (*)    All other components within normal limits  COMPREHENSIVE METABOLIC PANEL - Abnormal; Notable for the following components:   Glucose, Bld 124 (*)    BUN 24 (*)    Calcium 10.9 (*)    Total Protein 8.3 (*)    Alkaline Phosphatase 193 (*)    All other components within normal limits  LITHIUM LEVEL - Abnormal; Notable for the following components:   Lithium Lvl 1.51 (*)    All other components within normal limits  CULTURE, BLOOD (ROUTINE X 2)  CULTURE, BLOOD (ROUTINE X 2)  RESP PANEL BY RT-PCR (RSV, FLU A&B, COVID)  RVPGX2  URINE CULTURE  PROTIME-INR  ETHANOL  LACTIC ACID, PLASMA  MAGNESIUM  URINALYSIS, ROUTINE W REFLEX MICROSCOPIC  LACTIC ACID, PLASMA  LITHIUM LEVEL  TROPONIN I (HIGH SENSITIVITY)    EKG EKG Interpretation  Date/Time:  Wednesday September 05 2022 13:36:51 EST Ventricular Rate:  51 PR Interval:  159 QRS Duration: 77 QT Interval:  632 QTC Calculation: 583 R Axis:   33 Text Interpretation: Ectopic atrial rhythm Borderline repolarization abnormality Prolonged QT interval Interpretation limited secondary to artifact Confirmed by Glyn Adeountryman, [place] [phone]([id number]) on 09/05/2022 2:31:18 PM  Radiology CT HEAD WO CONTRAST  Result Date: 09/05/2022 CLINICAL DATA:  Headache, neuro deficit EXAM: CT HEAD WITHOUT CONTRAST TECHNIQUE: Contiguous axial images were obtained from the base of the skull through the vertex without intravenous contrast. RADIATION DOSE REDUCTION: This exam  was performed according to the departmental dose-optimization program which includes automated exposure control, adjustment of the mA and/or kV according to patient size and/or use of iterative reconstruction technique. COMPARISON:  None Available. FINDINGS: Brain: No evidence of acute large vascular territory infarction, hemorrhage, hydrocephalus, extra-axial collection or mass lesion/mass effect. Remote appearing left frontal infarct. Cerebral atrophy. Vascular: No hyperdense vessel identified. Skull: No acute fracture. Sinuses/Orbits: Clear sinuses.  No acute orbital findings. Other: No mastoid effusions. IMPRESSION: 1. No evidence of acute intracranial abnormality. 2. Remote appearing left frontal lobe infarct. MRI could provide more  sensitive evaluation for acute infarct if clinically warranted. Electronically Signed   By: Feliberto Harts M.D.   On: 09/05/2022 14:42   DG Chest 2 View  Result Date: 09/05/2022 CLINICAL DATA:  Difficulty swallowing EXAM: CHEST - 2 VIEW COMPARISON:  08/05/2018 FINDINGS: Radiograph is obtained in apical lordotic projection limiting evaluation of lower lung fields. Cardiac size is within normal limits. Metallic sutures are seen in sternum. There are no signs of pulmonary edema or definite focal pulmonary consolidation. Linear densities in right lower lung fields have not changed significantly, possibly suggesting scarring. There is no significant pleural effusion or pneumothorax. IMPRESSION: There are no signs of pulmonary edema or new focal infiltrates. Small linear densities in right lower lung fields suggest possible scarring or subsegmental atelectasis. Electronically Signed   By: Ernie Avena M.D.   On: 09/05/2022 14:37    Procedures .Critical Care  Performed by: Su Monks, PA-C Authorized by: Su Monks, PA-C   Critical care provider statement:    Critical care time (minutes):  30   Critical care was necessary to treat or prevent imminent  or life-threatening deterioration of the following conditions:  Toxidrome   Critical care was time spent personally by me on the following activities:  Development of treatment plan with patient or surrogate, discussions with consultants, evaluation of patient's response to treatment, examination of patient, ordering and review of laboratory studies, ordering and review of radiographic studies, ordering and performing treatments and interventions, pulse oximetry, re-evaluation of patient's condition and review of old charts   Care discussed with: admitting provider   Comments:     Lithium toxicity, discussed with Poison Control     Medications Ordered in ED Medications  LORazepam (ATIVAN) injection 1 mg (1 mg Intravenous Given 09/05/22 1624)  voriconazole (VFEND) tablet 200 mg (has no administration in time range)  lactated ringers bolus 1,000 mL (0 mLs Intravenous Stopped 09/05/22 1923)  sodium chloride 0.9 % bolus 1,000 mL (1,000 mLs Intravenous New Bag/Given 09/05/22 1924)    ED Course/ Medical Decision Making/ A&P Clinical Course as of 09/05/22 1952  Wed Sep 05, 2022  1430 Stable difficulty swallowing. Hx of similar. Hx of CVA and history of thrush. Back at baseline.  [CC]    Clinical Course User Index [CC] Glyn Ade, MD                           Medical Decision Making Amount and/or Complexity of Data Reviewed Labs: ordered. Radiology: ordered.  Risk Prescription drug management. Decision regarding hospitalization.  This patient is a 62 y.o. male who presents to the ED for concern of difficulty swallowing x 2 days, near syncopal episode this morning, this involves an extensive number of treatment options, and is a complaint that carries with it a high risk of complications and morbidity. The emergent differential diagnosis prior to evaluation includes, but is not limited to, hypoxia, hyper/hypoglycemia, encephalopathy, sepsis, DKA/HHS, brain lesion, CVA, seizure,  hypothermia, heat stroke, psychiatric, dementia, esophageal dysmotility. This is not an exhaustive differential.   Past Medical History / Co-morbidities / Social History: bipolar disorder on lithium, CAD, dementia, depression, hyperlipidemia, hypertension, stroke  Additional history: Chart reviewed. Pertinent results include: Patient previously followed with [facility] gastroenterology, most recently had colonoscopy and endoscopy in June of this year.  Last PCP visit in August, which they noted that patient's weight had improved after being treated for esophageal candidiasis with Diflucan.  Physical Exam: Physical exam  performed. The pertinent findings include: Bradycardic, appears patient's baseline.  Afebrile.  No increased work of breathing.  Mental state appears to be at baseline.  White exudate in the oropharynx consistent with thrush.  Lab Tests: I ordered, and personally interpreted labs.  The pertinent results include: Leukocytosis of 17.  Normal sodium, potassium, and creatinine.  Normal magnesium.  Normal lactic acid.  Lithium 1.51.  Negative ethanol.  Urinalysis pending.   Imaging Studies: I ordered imaging studies including CT head and chest x-ray. I independently visualized and interpreted imaging which showed no acute findings. I agree with the radiologist interpretation.   Cardiac Monitoring:  The patient was maintained on a cardiac monitor.  My attending physician Dr. Doran Durand viewed and interpreted the cardiac monitored which showed an underlying rhythm of: ectopic atrial rhythm with prolonged QT interval. I agree with this interpretation.   Medications: I ordered medication including IV fluids, ativan, and voriconazole  for lithium toxicity, agitation, and esophageal candidasis. I have reviewed the patients home medicines and have made adjustments as needed.  Consultations Obtained: I consulted with poison control who recommended aggressive IV fluids, recheck lithium  level in 6 hours (0100 AM on 12/21). If patient does not improve neurologically or has change in cardiac status, he will likely require dialysis.   I requested consultation with the hospitalist Dr Carren Rang, and discussed lab and imaging findings as well as pertinent plan - they recommend: medical admission   Disposition: After consideration of the diagnostic results and the patients response to treatment, I feel that patient is requiring admission for lithium toxicity and altered mental status.   I discussed this case with my attending physician Dr. Estell Harpin who cosigned this note including patient's presenting symptoms, physical exam, and planned diagnostics and interventions. Attending physician stated agreement with plan or made changes to plan which were implemented.    Final Clinical Impression(s) / ED Diagnoses Final diagnoses:  Lithium toxicity, undetermined intent, initial encounter  Esophageal candidiasis (HCC)  Altered mental status, unspecified altered mental status type    Rx / DC Orders ED Discharge Orders     None      Portions of this report may have been transcribed using voice recognition software. Every effort was made to ensure accuracy; however, inadvertent computerized transcription errors may be present.    Jeanella Flattery 09/05/22 Louann Liv, MD 09/13/22 1646

## 2022-09-05 NOTE — ED Triage Notes (Signed)
Pt presents with difficulty swallowing solid food x 1 week. Pt is able to swallow liquids. Per family pt is having some drooling at times that is new. Pt with hx of CVA and esophageal problems. LKW was 12/14. Pt does have dementia and incomprehensible speech at baseline.

## 2022-09-06 ENCOUNTER — Observation Stay (HOSPITAL_COMMUNITY): Payer: Medicare Other

## 2022-09-06 ENCOUNTER — Encounter (HOSPITAL_COMMUNITY): Payer: Self-pay | Admitting: Family Medicine

## 2022-09-06 ENCOUNTER — Inpatient Hospital Stay (HOSPITAL_COMMUNITY): Payer: Medicare Other

## 2022-09-06 DIAGNOSIS — K7689 Other specified diseases of liver: Secondary | ICD-10-CM | POA: Diagnosis present

## 2022-09-06 DIAGNOSIS — E782 Mixed hyperlipidemia: Secondary | ICD-10-CM

## 2022-09-06 DIAGNOSIS — F319 Bipolar disorder, unspecified: Secondary | ICD-10-CM

## 2022-09-06 DIAGNOSIS — R5381 Other malaise: Secondary | ICD-10-CM | POA: Diagnosis not present

## 2022-09-06 DIAGNOSIS — B3781 Candidal esophagitis: Secondary | ICD-10-CM | POA: Diagnosis present

## 2022-09-06 DIAGNOSIS — I2510 Atherosclerotic heart disease of native coronary artery without angina pectoris: Secondary | ICD-10-CM | POA: Diagnosis present

## 2022-09-06 DIAGNOSIS — T56894A Toxic effect of other metals, undetermined, initial encounter: Secondary | ICD-10-CM

## 2022-09-06 DIAGNOSIS — T56891A Toxic effect of other metals, accidental (unintentional), initial encounter: Secondary | ICD-10-CM | POA: Diagnosis not present

## 2022-09-06 DIAGNOSIS — F03C18 Unspecified dementia, severe, with other behavioral disturbance: Secondary | ICD-10-CM | POA: Diagnosis present

## 2022-09-06 DIAGNOSIS — Y92009 Unspecified place in unspecified non-institutional (private) residence as the place of occurrence of the external cause: Secondary | ICD-10-CM | POA: Diagnosis not present

## 2022-09-06 DIAGNOSIS — E871 Hypo-osmolality and hyponatremia: Secondary | ICD-10-CM | POA: Diagnosis present

## 2022-09-06 DIAGNOSIS — R9431 Abnormal electrocardiogram [ECG] [EKG]: Secondary | ICD-10-CM | POA: Diagnosis not present

## 2022-09-06 DIAGNOSIS — Z681 Body mass index (BMI) 19 or less, adult: Secondary | ICD-10-CM | POA: Diagnosis not present

## 2022-09-06 DIAGNOSIS — R1310 Dysphagia, unspecified: Secondary | ICD-10-CM | POA: Diagnosis not present

## 2022-09-06 DIAGNOSIS — T43591A Poisoning by other antipsychotics and neuroleptics, accidental (unintentional), initial encounter: Secondary | ICD-10-CM | POA: Diagnosis present

## 2022-09-06 DIAGNOSIS — Z8673 Personal history of transient ischemic attack (TIA), and cerebral infarction without residual deficits: Secondary | ICD-10-CM | POA: Diagnosis not present

## 2022-09-06 DIAGNOSIS — Z515 Encounter for palliative care: Secondary | ICD-10-CM | POA: Diagnosis not present

## 2022-09-06 DIAGNOSIS — Z8249 Family history of ischemic heart disease and other diseases of the circulatory system: Secondary | ICD-10-CM | POA: Diagnosis not present

## 2022-09-06 DIAGNOSIS — E43 Unspecified severe protein-calorie malnutrition: Secondary | ICD-10-CM | POA: Diagnosis not present

## 2022-09-06 DIAGNOSIS — G9341 Metabolic encephalopathy: Secondary | ICD-10-CM | POA: Diagnosis not present

## 2022-09-06 DIAGNOSIS — R7989 Other specified abnormal findings of blood chemistry: Secondary | ICD-10-CM

## 2022-09-06 DIAGNOSIS — I110 Hypertensive heart disease with heart failure: Secondary | ICD-10-CM | POA: Diagnosis present

## 2022-09-06 DIAGNOSIS — R531 Weakness: Secondary | ICD-10-CM | POA: Diagnosis not present

## 2022-09-06 DIAGNOSIS — I6389 Other cerebral infarction: Secondary | ICD-10-CM | POA: Diagnosis not present

## 2022-09-06 DIAGNOSIS — T56894D Toxic effect of other metals, undetermined, subsequent encounter: Secondary | ICD-10-CM | POA: Diagnosis not present

## 2022-09-06 DIAGNOSIS — F03918 Unspecified dementia, unspecified severity, with other behavioral disturbance: Secondary | ICD-10-CM | POA: Diagnosis not present

## 2022-09-06 DIAGNOSIS — Z66 Do not resuscitate: Secondary | ICD-10-CM | POA: Diagnosis not present

## 2022-09-06 DIAGNOSIS — Z7401 Bed confinement status: Secondary | ICD-10-CM | POA: Diagnosis not present

## 2022-09-06 DIAGNOSIS — Z7189 Other specified counseling: Secondary | ICD-10-CM | POA: Diagnosis not present

## 2022-09-06 DIAGNOSIS — I5022 Chronic systolic (congestive) heart failure: Secondary | ICD-10-CM | POA: Diagnosis present

## 2022-09-06 DIAGNOSIS — E785 Hyperlipidemia, unspecified: Secondary | ICD-10-CM | POA: Diagnosis present

## 2022-09-06 DIAGNOSIS — F03C3 Unspecified dementia, severe, with mood disturbance: Secondary | ICD-10-CM | POA: Diagnosis present

## 2022-09-06 DIAGNOSIS — R627 Adult failure to thrive: Secondary | ICD-10-CM | POA: Diagnosis not present

## 2022-09-06 DIAGNOSIS — Z96651 Presence of right artificial knee joint: Secondary | ICD-10-CM | POA: Diagnosis present

## 2022-09-06 DIAGNOSIS — R4182 Altered mental status, unspecified: Secondary | ICD-10-CM | POA: Diagnosis not present

## 2022-09-06 DIAGNOSIS — R4189 Other symptoms and signs involving cognitive functions and awareness: Secondary | ICD-10-CM | POA: Diagnosis not present

## 2022-09-06 DIAGNOSIS — I708 Atherosclerosis of other arteries: Secondary | ICD-10-CM | POA: Diagnosis present

## 2022-09-06 DIAGNOSIS — E870 Hyperosmolality and hypernatremia: Secondary | ICD-10-CM | POA: Diagnosis present

## 2022-09-06 DIAGNOSIS — Z87891 Personal history of nicotine dependence: Secondary | ICD-10-CM | POA: Diagnosis not present

## 2022-09-06 DIAGNOSIS — Z1152 Encounter for screening for COVID-19: Secondary | ICD-10-CM | POA: Diagnosis not present

## 2022-09-06 DIAGNOSIS — R64 Cachexia: Secondary | ICD-10-CM | POA: Diagnosis present

## 2022-09-06 LAB — RAPID URINE DRUG SCREEN, HOSP PERFORMED
Amphetamines: NOT DETECTED
Barbiturates: NOT DETECTED
Benzodiazepines: NOT DETECTED
Cocaine: NOT DETECTED
Opiates: NOT DETECTED
Tetrahydrocannabinol: POSITIVE — AB

## 2022-09-06 LAB — LITHIUM LEVEL
Lithium Lvl: 1.32 mmol/L — ABNORMAL HIGH (ref 0.60–1.20)
Lithium Lvl: 1.07 mmol/L (ref 0.60–1.20)

## 2022-09-06 LAB — COMPREHENSIVE METABOLIC PANEL
ALT: 22 U/L (ref 0–44)
AST: 20 U/L (ref 15–41)
Albumin: 3.6 g/dL (ref 3.5–5.0)
Alkaline Phosphatase: 155 U/L — ABNORMAL HIGH (ref 38–126)
Anion gap: 6 (ref 5–15)
BUN: 15 mg/dL (ref 8–23)
CO2: 27 mmol/L (ref 22–32)
Calcium: 10.1 mg/dL (ref 8.9–10.3)
Chloride: 116 mmol/L — ABNORMAL HIGH (ref 98–111)
Creatinine, Ser: 1.06 mg/dL (ref 0.61–1.24)
GFR, Estimated: >60 mL/min (ref >60)
Glucose, Bld: 115 mg/dL — ABNORMAL HIGH (ref 70–99)
Potassium: 4.6 mmol/L (ref 3.5–5.1)
Sodium: 149 mmol/L — ABNORMAL HIGH (ref 135–145)
Total Bilirubin: 0.6 mg/dL (ref 0.3–1.2)
Total Protein: 7.1 g/dL (ref 6.5–8.1)

## 2022-09-06 LAB — URINALYSIS, ROUTINE W REFLEX MICROSCOPIC
Bilirubin Urine: NEGATIVE
Glucose, UA: NEGATIVE mg/dL
Hgb urine dipstick: NEGATIVE
Ketones, ur: NEGATIVE mg/dL
Leukocytes,Ua: NEGATIVE
Nitrite: NEGATIVE
Protein, ur: NEGATIVE mg/dL
Specific Gravity, Urine: 1.008 (ref 1.005–1.030)
pH: 8 (ref 5.0–8.0)

## 2022-09-06 LAB — CBC
HCT: 33.8 % — ABNORMAL LOW (ref 39.0–52.0)
Hemoglobin: 10.1 g/dL — ABNORMAL LOW (ref 13.0–17.0)
MCH: 24.6 pg — ABNORMAL LOW (ref 26.0–34.0)
MCHC: 29.9 g/dL — ABNORMAL LOW (ref 30.0–36.0)
MCV: 82.2 fL (ref 80.0–100.0)
Platelets: 188 10*3/uL (ref 150–400)
RBC: 4.11 MIL/uL — ABNORMAL LOW (ref 4.22–5.81)
RDW: 15.5 % (ref 11.5–15.5)
WBC: 10.9 10*3/uL — ABNORMAL HIGH (ref 4.0–10.5)
nRBC: 0 % (ref 0.0–0.2)

## 2022-09-06 LAB — LIPID PANEL
Cholesterol: 164 mg/dL (ref 0–200)
HDL: 64 mg/dL (ref >40)
LDL Cholesterol: 86 mg/dL (ref 0–99)
Total CHOL/HDL Ratio: 2.6 RATIO
Triglycerides: 71 mg/dL (ref <150)
VLDL: 14 mg/dL (ref 0–40)

## 2022-09-06 LAB — HIV ANTIBODY (ROUTINE TESTING W REFLEX): HIV Screen 4th Generation wRfx: NONREACTIVE

## 2022-09-06 MED ORDER — HALOPERIDOL LACTATE 5 MG/ML IJ SOLN
2.0000 mg | Freq: Once | INTRAMUSCULAR | Status: AC
  Administered 2022-09-06: 2 mg via INTRAVENOUS
  Filled 2022-09-06: qty 1

## 2022-09-06 MED ORDER — HALOPERIDOL LACTATE 5 MG/ML IJ SOLN
2.0000 mg | Freq: Four times a day (QID) | INTRAMUSCULAR | Status: DC | PRN
  Administered 2022-09-06 – 2022-10-04 (×25): 2 mg via INTRAVENOUS
  Filled 2022-09-06 (×28): qty 1

## 2022-09-06 MED ORDER — LORAZEPAM 2 MG/ML IJ SOLN
2.0000 mg | INTRAMUSCULAR | Status: DC | PRN
  Administered 2022-09-06 – 2022-09-20 (×31): 2 mg via INTRAVENOUS
  Filled 2022-09-06 (×31): qty 1

## 2022-09-06 NOTE — Assessment & Plan Note (Addendum)
-   see lithium toxicity  - haldol / ativan PRN

## 2022-09-06 NOTE — ED Notes (Signed)
Poison Control center called, advised to continue fluids (norma saline) and case will be closed.

## 2022-09-06 NOTE — Assessment & Plan Note (Deleted)
-   Lithium - Repeat lithium level in the a.m. - Continue fluids - Continue to monitor

## 2022-09-06 NOTE — Evaluation (Signed)
Physical Therapy Evaluation Patient Details Name: [name] MRN: [id number] DOB: 1960/11/13 Today's Date: 09/06/2022  History of Present Illness  [name] is a 61 y.o. male with medical history significant of CAD, dementia, bipolar 1 disorder, hyperlipidemia, hypertension, stroke presents to the ED with a chief complaint of dysphagia.  Unfortunately patient is not able to provide any history right now.  He is hypersomnolent after having been agitated through most of the night which was not improved with multiple doses of Ativan.  He then had a dose of Haldol.  Neither when he was agitated nor status post meds as he been able to provide reliable history.  Girlfriend is with him beginning of his ER stay.  She reported that he is having trouble swallowing.  Food seems to drool on the side of his mouth.  She reported one of the nurses that he was able to drink liquids.  She stated been more confused and "out of it."  According to chart review family says that his baseline is able to answer yes or no questions but otherwise incomprehensible.  Further information could be obtained at this time.   Clinical Impression  Patient presents confused, impulsive and requires constant verbal/tactile curing to participate with therapy.  Patient very unsteady on feet and limited to a few side steps at bedside during transfer to chair, had difficulty advancing BLE due to weakness, mostly shuffling of feet requiring bilateral hand held assist to maintain standing balance and unsafe to stay up in chair due to impulsive behavior and put back to bed with sitter in room.  Patient will benefit from continued skilled physical therapy in hospital and recommended venue below to increase strength, balance, endurance for safe ADLs and gait.        Recommendations for follow up therapy are one component of a multi-disciplinary discharge planning process, led by the attending physician.  Recommendations may be updated  based on patient status, additional functional criteria and insurance authorization.  Follow Up Recommendations Skilled nursing-short term rehab (<3 hours/day) Can patient physically be transported by private vehicle: No    Assistance Recommended at Discharge Frequent or constant Supervision/Assistance  Patient can return home with the following  A lot of help with bathing/dressing/bathroom;A lot of help with walking and/or transfers;Help with stairs or ramp for entrance;Assistance with cooking/housework    Equipment Recommendations None recommended by PT  Recommendations for Other Services       Functional Status Assessment Patient has had a recent decline in their functional status and demonstrates the ability to make significant improvements in function in a reasonable and predictable amount of time.     Precautions / Restrictions Precautions Precautions: Fall Restrictions Weight Bearing Restrictions: No      Mobility  Bed Mobility Overal bed mobility: Needs Assistance Bed Mobility: Supine to Sit, Sit to Supine     Supine to sit: Mod assist Sit to supine: Mod assist   General bed mobility comments: required assistance mostly due to impulsive unsafe behavior    Transfers Overall transfer level: Needs assistance Equipment used: 1 person hand held assist Transfers: Sit to/from Stand, Bed to chair/wheelchair/BSC Sit to Stand: Mod assist   Step pivot transfers: Mod assist       General transfer comment: unsteady labored movement requiring Max verbal/tactile cueing to follow instructions    Ambulation/Gait Ambulation/Gait assistance: Mod assist, Max assist Gait Distance (Feet): 5 Feet Assistive device: 1 person hand held assist Gait Pattern/deviations: Decreased step length - left,  Decreased stance time - right, Decreased stride length, Shuffle Gait velocity: slow     General Gait Details: limited to a few side steps at bedside with mostly shuffling of feet due  to poor coordination when andvancing BLE  Stairs            Wheelchair Mobility    Modified Rankin (Stroke Patients Only)       Balance Overall balance assessment: Needs assistance Sitting-balance support: Feet supported, No upper extremity supported Sitting balance-Leahy Scale: Poor Sitting balance - Comments: fair/poor seated at EOB   Standing balance support: During functional activity, Bilateral upper extremity supported Standing balance-Leahy Scale: Poor Standing balance comment: with bilateral hand held assist, poor carryover for attempting to use a RW                             Pertinent Vitals/Pain Pain Assessment Pain Assessment: No/denies pain    Home Living Family/patient expects to be discharged to:: Private residence Living Arrangements: Spouse/significant other Available Help at Discharge: Family;Available 24 hours/day Type of Home: House Home Access: Stairs to enter Entrance Stairs-Rails: Doctor, general practice of Steps: 3   Home Layout: One level Home Equipment: Cane - single point      Prior Function Prior Level of Function : Needs assist       Physical Assist : Mobility (physical) Mobility (physical): Bed mobility;Transfers;Gait;Stairs   Mobility Comments: household ambulator "patient is poor historian" ADLs Comments: assisted by family     Hand Dominance        Extremity/Trunk Assessment   Upper Extremity Assessment Upper Extremity Assessment: Generalized weakness    Lower Extremity Assessment Lower Extremity Assessment: Generalized weakness    Cervical / Trunk Assessment Cervical / Trunk Assessment: Normal  Communication   Communication: Expressive difficulties  Cognition Arousal/Alertness: Awake/alert Behavior During Therapy: Impulsive, Agitated Overall Cognitive Status: No family/caregiver present to determine baseline cognitive functioning                                           General Comments      Exercises     Assessment/Plan    PT Assessment Patient needs continued PT services  PT Problem List Decreased strength;Decreased activity tolerance;Decreased balance;Decreased mobility       PT Treatment Interventions DME instruction;Gait training;Stair training;Functional mobility training;Therapeutic activities;Therapeutic exercise;Patient/family education;Balance training    PT Goals (Current goals can be found in the Care Plan section)  Acute Rehab PT Goals Patient Stated Goal: not stated PT Goal Formulation: With patient Time For Goal Achievement: 09/20/22 Potential to Achieve Goals: Good    Frequency Min 3X/week     Co-evaluation               AM-PAC PT "6 Clicks" Mobility  Outcome Measure Help needed turning from your back to your side while in a flat bed without using bedrails?: A Lot Help needed moving from lying on your back to sitting on the side of a flat bed without using bedrails?: A Lot Help needed moving to and from a bed to a chair (including a wheelchair)?: A Lot Help needed standing up from a chair using your arms (e.g., wheelchair or bedside chair)?: A Lot Help needed to walk in hospital room?: A Lot Help needed climbing 3-5 steps with a railing? : Total 6 Click Score: 11  End of Session   Activity Tolerance: Patient tolerated treatment well;Patient limited by fatigue;Treatment limited secondary to agitation Patient left: in bed;with call bell/phone within reach;with nursing/sitter in room Nurse Communication: Mobility status PT Visit Diagnosis: Unsteadiness on feet (R26.81);Other abnormalities of gait and mobility (R26.89);Muscle weakness (generalized) (M62.81)    Time: 1010-1041 PT Time Calculation (min) (ACUTE ONLY): 31 min   Charges:   PT Evaluation $PT Eval Moderate Complexity: 1 Mod PT Treatments $Therapeutic Activity: 23-37 mins        2:39 PM, 09/06/22 Ocie Bob, MPT Physical Therapist with  [facility] [phone] office [phone] mobile phone

## 2022-09-06 NOTE — Assessment & Plan Note (Addendum)
-   Etiology attributed to probable lithium toxicity - CT head unremarkable for acute findings.  Chronic left frontal lobe infarct appreciated -MRI brain also negative for acute stroke -Patient is reported to have some altered mentation at baseline as well; per wife, he is nonverbal at baseline -Corporate investment banker - see GOC

## 2022-09-06 NOTE — Assessment & Plan Note (Addendum)
-   Likely related to elevated lithium level - patient pulling off tele too much for meaning capture; okay to d/c - serial EKGs as he allows too - Hold QT prolonging agents when possible - Monitor and replace electrolytes as indicated

## 2022-09-06 NOTE — Progress Notes (Addendum)
Progress Note    [name]   QQV:[id number]  DOB: 05-03-1961  DOA: 09/05/2022     0 PCP: [facility]  Initial CC: AMS  Hospital Course: [name] is a 61 yo male with PMH dementia, bipolar 1 disorder, HLD, HTN, CAD, CVA who presented with confusion and dysphagia. With further workup he was also found to be agitated/impulsive, and unable to be redirected.  He required Haldol and Ativan.  His significant other was concerned about his inability to swallow and he was brought for further workup. Because of the difficulty eating/dysphagia, he was initially initiated on a stroke workup.  However, further workup also was notable for an elevated lithium level.  Given his presenting symptoms, it was felt that they were attributed to lithium toxicity.  Poison control was called and patient was started on fluids and admitted.  Interval History:  Seen in his room this morning with sitter present bedside.  He was agitated and moving around in bed not purposefully.  He could follow some commands at times but not persistently. Did not appear to be in any distress or pain.  Assessment and Plan: * Lithium toxicity - Lithium level 1.51 on admission -Etiology possibly due to chronic toxicity versus possible extra doses unintentionally - Patient presents with predominate neuro signs (agitation, confusion, dysphagia) supporting chronic toxicity; also cardiac effect with prolonged Qtc -Poison center aware - Continue fluids - Continue trending lithium level - Caution use of Haldol for his agitation.  Try to use Ativan more  Acute metabolic encephalopathy - Etiology attributed to probable lithium toxicity - CT head unremarkable for acute findings.  Chronic left frontal lobe infarct appreciated - MRI brain pending but not urgent and was initiated for ruling out acute stroke.  Patient cannot lay still enough for MRI at this time -Patient is reported to have some  altered mentation at baseline as well -Continue sitter  Prolonged QT interval - Likely related to elevated lithium level - Monitor on telemetry - Hold lithium - Hold QT prolonging agents when possible - Monitor and replace electrolytes as indicated  Dysphagia - Suspected due to lithium toxicity - did have recent EGD June 2023, cannot see results, but per GI had gastritis and Candida esophagitis - Obtain MRI brain as able; currently he is too agitated and impulsive -Hopeful that dysphagia improves as lithium level downtrends -Possible consideration of empiric treatment for Candida again; await further and final GI recommendations  Hyperlipidemia - hold statin  Bipolar 1 disorder (HCC) - Holding lithium - haldol / ativan PRN  Old records reviewed in assessment of this patient  Antimicrobials:   DVT prophylaxis:  heparin injection 5,000 Units Start: 09/05/22 2200 SCD's Start: 09/05/22 2038   Code Status:   Code Status: Full Code  Mobility Assessment (last 72 hours)     Mobility Assessment     Row Name 09/06/22 02:08:26           Does patient have an order for bedrest or is patient medically unstable No - Continue assessment       What is the highest level of mobility based on the progressive mobility assessment? Level 1 (Bedfast) - Unable to balance while sitting on edge of bed                Barriers to discharge:  Disposition Plan: Home 2 to 3 days Status is: Observation, needing inpatient change  Objective: Blood pressure 137/70, pulse (!) 56, temperature (!) 97.5  F (36.4 C), temperature source Oral, resp. rate 18, height 5\' 8"  (1.727 m), weight 59.4 kg, SpO2 100 %.  Examination:  Physical Exam Constitutional:      Comments: Adult man appearing older than stated age lying in bed with mittens in place and sitter bedside and he is unable to follow most commands and is unable to be redirected and appears very agitated and impulsive  HENT:     Head:  Normocephalic and atraumatic.     Mouth/Throat:     Mouth: Mucous membranes are dry.  Eyes:     Extraocular Movements: Extraocular movements intact.     Pupils: Pupils are equal, round, and reactive to light.  Cardiovascular:     Rate and Rhythm: Normal rate and regular rhythm.  Pulmonary:     Effort: Pulmonary effort is normal.     Breath sounds: Normal breath sounds.  Abdominal:     General: Bowel sounds are normal. There is no distension.     Palpations: Abdomen is soft.     Tenderness: There is no abdominal tenderness.  Musculoskeletal:        General: No swelling.     Cervical back: Normal range of motion and neck supple.  Skin:    General: Skin is warm and dry.  Neurological:     Comments: Moving all 4 extremities spontaneously and unable to follow commands consistently      Consultants:    Procedures:    Data Reviewed: Results for orders placed or performed during the hospital encounter of 09/05/22 (from the past 24 hour(s))  Protime-INR     Status: None   Collection Time: 09/05/22  1:44 PM  Result Value Ref Range   Prothrombin Time 14.0 11.4 - 15.2 seconds   INR 1.1 0.8 - 1.2  APTT     Status: Abnormal   Collection Time: 09/05/22  1:44 PM  Result Value Ref Range   aPTT 20 (L) 24 - 36 seconds  CBC     Status: Abnormal   Collection Time: 09/05/22  1:44 PM  Result Value Ref Range   WBC 17.0 (H) 4.0 - 10.5 K/uL   RBC 6.45 (H) 4.22 - 5.81 MIL/uL   Hemoglobin 15.7 13.0 - 17.0 g/dL   HCT 53.0 (H) 39.0 - 52.0 %   MCV 82.2 80.0 - 100.0 fL   MCH 24.3 (L) 26.0 - 34.0 pg   MCHC 29.6 (L) 30.0 - 36.0 g/dL   RDW 16.7 (H) 11.5 - 15.5 %   Platelets 214 150 - 400 K/uL   nRBC 0.0 0.0 - 0.2 %  Differential     Status: Abnormal   Collection Time: 09/05/22  1:44 PM  Result Value Ref Range   Neutrophils Relative % 88 %   Neutro Abs 14.9 (H) 1.7 - 7.7 K/uL   Lymphocytes Relative 7 %   Lymphs Abs 1.2 0.7 - 4.0 K/uL   Monocytes Relative 4 %   Monocytes Absolute 0.7 0.1 -  1.0 K/uL   Eosinophils Relative 0 %   Eosinophils Absolute 0.1 0.0 - 0.5 K/uL   Basophils Relative 1 %   Basophils Absolute 0.1 0.0 - 0.1 K/uL   WBC Morphology MORPHOLOGY UNREMARKABLE    RBC Morphology MORPHOLOGY UNREMARKABLE    Immature Granulocytes 0 %   Abs Immature Granulocytes 0.06 0.00 - 0.07 K/uL  Comprehensive metabolic panel     Status: Abnormal   Collection Time: 09/05/22  1:44 PM  Result Value Ref Range   Sodium  141 135 - 145 mmol/L   Potassium 4.5 3.5 - 5.1 mmol/L   Chloride 106 98 - 111 mmol/L   CO2 28 22 - 32 mmol/L   Glucose, Bld 124 (H) 70 - 99 mg/dL   BUN 24 (H) 8 - 23 mg/dL   Creatinine, Ser 1.24 0.61 - 1.24 mg/dL   Calcium 10.9 (H) 8.9 - 10.3 mg/dL   Total Protein 8.3 (H) 6.5 - 8.1 g/dL   Albumin 4.1 3.5 - 5.0 g/dL   AST 21 15 - 41 U/L   ALT 25 0 - 44 U/L   Alkaline Phosphatase 193 (H) 38 - 126 U/L   Total Bilirubin 0.5 0.3 - 1.2 mg/dL   GFR, Estimated >60 >60 mL/min   Anion gap 7 5 - 15  Ethanol     Status: None   Collection Time: 09/05/22  1:44 PM  Result Value Ref Range   Alcohol, Ethyl (B) <10 <10 mg/dL  Troponin I (High Sensitivity)     Status: None   Collection Time: 09/05/22  1:44 PM  Result Value Ref Range   Troponin I (High Sensitivity) 4 <18 ng/L  Lithium level     Status: Abnormal   Collection Time: 09/05/22  5:03 PM  Result Value Ref Range   Lithium Lvl 1.51 (HH) 0.60 - 1.20 mmol/L  Magnesium     Status: None   Collection Time: 09/05/22  5:03 PM  Result Value Ref Range   Magnesium 2.2 1.7 - 2.4 mg/dL  Blood culture (routine x 2)     Status: None (Preliminary result)   Collection Time: 09/05/22  7:00 PM   Specimen: BLOOD  Result Value Ref Range   Specimen Description BLOOD LEFT ANTECUBITAL    Special Requests      BOTTLES DRAWN AEROBIC AND ANAEROBIC Blood Culture adequate volume Performed at [facility], [address] [place], [place] [address]    Culture PENDING    Report Status PENDING   Blood culture (routine x 2)     Status:  None (Preliminary result)   Collection Time: 09/05/22  7:00 PM   Specimen: BLOOD  Result Value Ref Range   Specimen Description BLOOD RIGHT ANTECUBITAL    Special Requests      BOTTLES DRAWN AEROBIC AND ANAEROBIC Blood Culture results may not be optimal due to an excessive volume of blood received in culture bottles   Culture  Setup Time      GRAM POSITIVE COCCI AEROBIC BOTTLE Gram Stain Report Called to,Read Back By and Verified With: Rachael Fee @[id number]  09/06/22 BY [facility] Performed at [facility], [address], [place], [place] [address]    Culture PENDING    Report Status PENDING   Lactic acid, plasma     Status: None   Collection Time: 09/05/22  7:00 PM  Result Value Ref Range   Lactic Acid, Venous 1.5 0.5 - 1.9 mmol/L  HIV Antibody (routine testing w rflx)     Status: None   Collection Time: 09/05/22  7:00 PM  Result Value Ref Range   HIV Screen 4th Generation wRfx Non Reactive Non Reactive  Resp panel by RT-PCR (RSV, Flu A&B, Covid) Anterior Nasal Swab     Status: None   Collection Time: 09/05/22  7:20 PM   Specimen: Anterior Nasal Swab  Result Value Ref Range   SARS Coronavirus 2 by RT PCR NEGATIVE NEGATIVE   Influenza A by PCR NEGATIVE NEGATIVE   Influenza B by PCR NEGATIVE NEGATIVE   Resp  Syncytial Virus by PCR NEGATIVE NEGATIVE  Lactic acid, plasma     Status: None   Collection Time: 09/05/22  9:51 PM  Result Value Ref Range   Lactic Acid, Venous 1.1 0.5 - 1.9 mmol/L  Lithium level     Status: Abnormal   Collection Time: 09/05/22 10:41 PM  Result Value Ref Range   Lithium Lvl 1.39 (H) 0.60 - 1.20 mmol/L  Lithium level     Status: Abnormal   Collection Time: 09/06/22 12:26 AM  Result Value Ref Range   Lithium Lvl 1.32 (H) 0.60 - 1.20 mmol/L  Urinalysis, Routine w reflex microscopic Urine, Clean Catch     Status: Abnormal   Collection Time: 09/06/22  4:30 AM  Result Value Ref Range   Color, Urine STRAW (A) YELLOW   APPearance CLEAR CLEAR   Specific Gravity,  Urine 1.008 1.005 - 1.030   pH 8.0 5.0 - 8.0   Glucose, UA NEGATIVE NEGATIVE mg/dL   Hgb urine dipstick NEGATIVE NEGATIVE   Bilirubin Urine NEGATIVE NEGATIVE   Ketones, ur NEGATIVE NEGATIVE mg/dL   Protein, ur NEGATIVE NEGATIVE mg/dL   Nitrite NEGATIVE NEGATIVE   Leukocytes,Ua NEGATIVE NEGATIVE  Rapid urine drug screen (hospital performed)     Status: Abnormal   Collection Time: 09/06/22  4:30 AM  Result Value Ref Range   Opiates NONE DETECTED NONE DETECTED   Cocaine NONE DETECTED NONE DETECTED   Benzodiazepines NONE DETECTED NONE DETECTED   Amphetamines NONE DETECTED NONE DETECTED   Tetrahydrocannabinol POSITIVE (A) NONE DETECTED   Barbiturates NONE DETECTED NONE DETECTED  CBC     Status: Abnormal   Collection Time: 09/06/22  5:40 AM  Result Value Ref Range   WBC 10.9 (H) 4.0 - 10.5 K/uL   RBC 4.11 (L) 4.22 - 5.81 MIL/uL   Hemoglobin 10.1 (L) 13.0 - 17.0 g/dL   HCT 33.8 (L) 39.0 - 52.0 %   MCV 82.2 80.0 - 100.0 fL   MCH 24.6 (L) 26.0 - 34.0 pg   MCHC 29.9 (L) 30.0 - 36.0 g/dL   RDW 15.5 11.5 - 15.5 %   Platelets 188 150 - 400 K/uL   nRBC 0.0 0.0 - 0.2 %  Comprehensive metabolic panel     Status: Abnormal   Collection Time: 09/06/22  7:29 AM  Result Value Ref Range   Sodium 149 (H) 135 - 145 mmol/L   Potassium 4.6 3.5 - 5.1 mmol/L   Chloride 116 (H) 98 - 111 mmol/L   CO2 27 22 - 32 mmol/L   Glucose, Bld 115 (H) 70 - 99 mg/dL   BUN 15 8 - 23 mg/dL   Creatinine, Ser 1.06 0.61 - 1.24 mg/dL   Calcium 10.1 8.9 - 10.3 mg/dL   Total Protein 7.1 6.5 - 8.1 g/dL   Albumin 3.6 3.5 - 5.0 g/dL   AST 20 15 - 41 U/L   ALT 22 0 - 44 U/L   Alkaline Phosphatase 155 (H) 38 - 126 U/L   Total Bilirubin 0.6 0.3 - 1.2 mg/dL   GFR, Estimated >60 >60 mL/min   Anion gap 6 5 - 15  Lipid panel     Status: None   Collection Time: 09/06/22  7:29 AM  Result Value Ref Range   Cholesterol 164 0 - 200 mg/dL   Triglycerides 71 <150 mg/dL   HDL 64 >40 mg/dL   Total CHOL/HDL Ratio 2.6 RATIO    VLDL 14 0 - 40 mg/dL   LDL Cholesterol 86 0 -  99 mg/dL    I have Reviewed nursing notes, Vitals, and Lab results since pt's last encounter. Pertinent lab results : see above I have ordered labwork to follow up on.  I have reviewed the last note from staff over past 24 hours I have discussed pt's care plan and test results with nursing staff, CM/SW, and other staff as appropriate  Time spent: Greater than 50% of the 55 minute visit was spent in counseling/coordination of care for the patient as laid out in the A&P.   LOS: 0 days   Dwyane Dee, MD [facility] 09/06/2022, 1:21 PM
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
## 2022-09-06 NOTE — Assessment & Plan Note (Addendum)
-   hold statin 

## 2022-09-06 NOTE — Assessment & Plan Note (Addendum)
-   Lithium level 1.51 on admission -Etiology possibly due to chronic toxicity versus possible extra doses unintentionally (wife/partner not certain) - Patient presents with predominate neuro signs (agitation, confusion, dysphagia) supporting chronic toxicity; also cardiac effect with prolonged Qtc -Poison center aware - Continue fluids - lithium level normalized and dropped - Caution use of Haldol for his agitation.  Try to use Ativan more - appreciate tele-psych rec's; he has been taken off lithium and started on depakote; continue p.o. for now and if necessary, we can transition to IV - overall he had poor physical reserve coming into this and already poor QOL with decline as evidenced by severe cachexia; he may not have enough reserve to overcome this; have communicated this to Sallye Ober too; patient to be DNR for now, but continue treatment in the hopes that he improves

## 2022-09-06 NOTE — Progress Notes (Signed)
*  PRELIMINARY RESULTS* Echocardiogram 2D Echocardiogram has been attempted 2 times this morning. Patient with AMS is combative and unable to follow instructions even with sedation. Will attempt echo on 09/08/2023.  Carolyne Fiscal 09/06/2022, 11:03 AM

## 2022-09-06 NOTE — Hospital Course (Addendum)
Mr. Krone is a 61 yo male with PMH dementia, bipolar 1 disorder, HLD, HTN, CAD, CVA who presented with confusion and dysphagia. With further workup he was also found to be agitated/impulsive, and unable to be redirected.  He required Haldol and Ativan.  His significant other was concerned about his inability to swallow and he was brought for further workup. Because of the difficulty eating/dysphagia, he was initially initiated on a stroke workup.  However, further workup also was notable for an elevated lithium level.  Given his presenting symptoms, it was felt that they were attributed to lithium toxicity.  Poison control was called and patient was started on fluids and admitted. Despite aggressive measures, he showed little improvement during hospitalization.  Given his poor physical reserve and declining functional status leading up to hospitalization, he appeared to be more consistent with failure to thrive.  Goals of care discussions were held with his significant other who is his main caretaker and decision maker.  He was made DNR and continued on medical treatment in the hopes for some improvement however his prognosis was made clear that it appears to be worsening daily.     09/18/22 -Had face-to-face Conference with patient's girlfriend on 09/13/2022 -Patient girlfriend requested that I do Not call her to talk to her about patient's poor prognosis anymore, because it makes her sad.- -Patient's girlfriend is accusing the medical team of "prematurely giving up on patient" --She tells me that she is not ready to make any decisions about possible de-escalation of care until the middle of January at the earliest -oral intake remains very poor -Hypoglycemia with blood sugar of 69 noted despite continuous IV dextrose drip-- -will increase IV dextrose rate -Overall prognosis remains poor given very poor oral intake due to persistent metabolic encephalopathy superimposed on advanced  dementia  09/19/22 -Remain calm comfortable, remained calm, comfortable, sleepy -Refused to eat or to be fed  09/20/2022 -Agitated confused overnight, Ativan and Haldol was used overnight -Appreciate palliative care following, and consulted ethics committee Current recommendation is neuroconsultation, attempt of Dobbhoff placement and initiation of tube feeding Fails we will proceed with comfort care   09/21/2022 -Some, minimal interaction between him and the nursing staff noted this morning with some oral intake -Plan remains for Dobbhoff placement per ethics committee recommendations Nutrition to be consulted for tube feeds -Psych has been consulted for evaluation -Pending neuro eval  09/22/2022 -Patient received Dobbhoff feeding tube placement by IR on 09/21/2022 -Nutrition has started tube feeding -Neurology has evaluated the patient-looking for reversible causes -Agitated overnight received Ativan around 3 AM Unresponsive non-interactive this a.m.  09/23/2022 -Patient was seen and examined -Agitated earlier per nursing staff -Dobbhoff tube still in place, tube feeds, ---------------------------------------------------------------------------------------------------------------------------------------- Assessment and Plan: 1)Lithium Toxicity Resolved  - Lithium level 1.51 on admission -- lithium level normalized and dropped -No change in mentation,-not interactive, unresponsive  -Status post psych/TTS evaluation - appreciate tele-psych rec's; he has been taken off lithium and started on depakote; continue p.o. for now and if necessary,  transitioned to IV - [name]: Cachectic, unresponsive, not interactive, poor p.o. 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 -The last time he was able to put a couple words together in a phrase was apparently around June 2023 when he said that the garage where he used to do his Curator work was on [employer] cabin [employer] -otherwise over the last 12 months patient may answer with one-word answers, significant cognitive decline -Patient lost over 40 pounds of weight due to poor oral intake     3)Goals of care, counseling/discussion - Long conversation held with [place] on 09/10/2022 and again on 09/13/2022, September 19, 2022  -Ongoing discussion daily with palliative care team -Ethic committee consulted, ongoing discussion For committee and Ms. Dossie Arbour -recommended trial of Dobbhoff feeding tube placement and tube feeding -    -No improvement-minimally interactive, impulsive,-continue to pull out IVs telemetry lines -As needed Ativan, Haldol -No PO nutritional intake -IV fluid limited as he is pulling the line out  -Palliative care team, and ethics committee consulted: Recommended: Attempt Dobbhoff tube and tube feeds if fails progress to comfort care Recommended Neuro consultation  - patient has been hospitalized for days at that point and he has shown no signs of improvement.  He remains minimally interactive and impulsive, constantly trying to climb out of bed.  He has pulled IV out as well as telemetry off repetitively.   -He also has had extremely minimal nutritional intake  since admission.    -WE do not find an to be a good candidate for G- tube placement as he will inevitably pull this out as well and PEG tube placement also considered medically futile and inappropriate in context of his multiple comorbidities, poor quality of life, and progressive functional decline  - Discussed with Sallye Ober that he does not appear to have the physical reserve to overcome the events that have led up to this hospitalization.  He may also not survive this hospitalization.     - [name] agrees with DNR status at this time. She does wish for ongoing attempts/treatment to see if he improves, but should he continue to decline (which I expect), then a natural death will be allowed. He is currently not a rehab candidate unless were to show tremendous improvement and rather he appears more appropriate for inpatient vs residential hospice depending on course of events over the next few days -Overall prognosis remains poor/grave -Family contact--   Deneise Lever [phone] -As per [name] .... No living family members.    09/21/2022 -Some, minimal interaction between him and the nursing staff noted this morning with some oral intake -Plan remains for Dobbhoff placement per ethics committee recommendations Nutrition to be consulted for tube feeds -Psych has been consulted for evaluation -Pending neuro eval   1/6, 2024 -Patient received Dobbhoff feeding tube placement by IR on 09/21/2022 -Nutrition has started tube feeding -Neurology has evaluated the patient-looking for reversible causes -Agitated overnight received Ativan around 3 AM Unresponsive non-interactive this a.m.  09/23/2022 -Patient was seen and examined -Agitated earlier per nursing staff -Dobbhoff tube still in place, tube feeds,  4)Hypernatremia -resolved   5)Acute metabolic encephalopathy in setting of baseline advanced/severe dementia with significant cognitive and memory deficits at baseline - Etiology  attributed to probable  lithium toxicity - CT head unremarkable for acute findings.  Chronic left frontal lobe infarct appreciated -MRI brain also negative for acute stroke -Patient is reported to have some altered mentation at baseline as well; per girlfriend,  he is mostly nonverbal at baseline----please see #2 above -Continue sitter -Continue assist of nursing staff   6)Dysphagia -Multifactorial including lithium toxicity, psychiatric disorder refusing oral intake -Per ethic committee trial of Dobbhoff tube place and enteric feeding if it fails we will proceed with comfort care  - did have recent EGD June 2023, cannot see results, but per GI had gastritis and Candida esophagitis - unfortunately no signs of improvement still - see GOC - evaluated by SLP - continue offering diet as able; poor candidate for NGT and PEG candidate; not appropriate for TPN (No clear goals for long-term solution)    Leukocytosis -likely reactive-resolved -Chest x-ray on 09/17/2022 without acute findings -Get blood cultures, check UA -Hold off on antibiotics   Prolonged QT interval - Likely related to elevated lithium level - patient pulling off tele too much for meaning capture; okay to d/c - serial EKGs as he allows too - Hold QT prolonging agents when possible   Hyperlipidemia -discontinued statin, risk outweighs the benefit for this patient   Bipolar 1 disorder (HCC) - Discontinued lithium, per psych recommendation on Cogentin, IV Depakote - haldol / ativan PRN   Contamination of blood culture-resolved as of 09/09/2022 - 1/4 bottles noted with GPC from 12/20 but apparently nothing seen on media per micro. Has remained afebrile with minimal leukocytosis (considered reactive from other processes) - Will Repeat blood culture if becomes febrile

## 2022-09-06 NOTE — Progress Notes (Signed)
1:1 safety sitter is bedside
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
## 2022-09-06 NOTE — Plan of Care (Signed)
  Problem: Acute Rehab PT Goals(only PT should resolve) Goal: Pt Will Go Supine/Side To Sit Outcome: Progressing Flowsheets (Taken 09/06/2022 1441) Pt will go Supine/Side to Sit:  with supervision  with min guard assist Goal: Patient Will Transfer Sit To/From Stand Outcome: Progressing Flowsheets (Taken 09/06/2022 1441) Patient will transfer sit to/from stand:  with supervision  with min guard assist Goal: Pt Will Transfer Bed To Chair/Chair To Bed Outcome: Progressing Flowsheets (Taken 09/06/2022 1441) Pt will Transfer Bed to Chair/Chair to Bed:  with supervision  min guard assist Goal: Pt Will Ambulate Outcome: Progressing Flowsheets (Taken 09/06/2022 1441) Pt will Ambulate:  50 feet  with minimal assist  with least restrictive assistive device  with rolling walker   2:41 PM, 09/06/22 Ocie Bob, MPT Physical Therapist with [facility] [phone] office [phone] mobile phone

## 2022-09-06 NOTE — Assessment & Plan Note (Addendum)
-   Suspected due to lithium toxicity - did have recent EGD June 2023, cannot see results, but per GI had gastritis and Candida esophagitis -Hopeful that dysphagia improves as lithium level downtrends - evaluated by SLP; once mentation can improve further can try offering dysphagia diet
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
## 2022-09-07 ENCOUNTER — Inpatient Hospital Stay (HOSPITAL_COMMUNITY): Payer: Medicare Other

## 2022-09-07 DIAGNOSIS — R1310 Dysphagia, unspecified: Secondary | ICD-10-CM | POA: Diagnosis not present

## 2022-09-07 DIAGNOSIS — T56891A Toxic effect of other metals, accidental (unintentional), initial encounter: Secondary | ICD-10-CM | POA: Diagnosis not present

## 2022-09-07 DIAGNOSIS — R9431 Abnormal electrocardiogram [ECG] [EKG]: Secondary | ICD-10-CM | POA: Diagnosis not present

## 2022-09-07 DIAGNOSIS — G9341 Metabolic encephalopathy: Secondary | ICD-10-CM | POA: Diagnosis not present

## 2022-09-07 LAB — COMPREHENSIVE METABOLIC PANEL
ALT: 21 U/L (ref 0–44)
AST: 23 U/L (ref 15–41)
Albumin: 3.5 g/dL (ref 3.5–5.0)
Alkaline Phosphatase: 151 U/L — ABNORMAL HIGH (ref 38–126)
Anion gap: 3 — ABNORMAL LOW (ref 5–15)
BUN: 12 mg/dL (ref 8–23)
CO2: 26 mmol/L (ref 22–32)
Calcium: 10.4 mg/dL — ABNORMAL HIGH (ref 8.9–10.3)
Chloride: 122 mmol/L — ABNORMAL HIGH (ref 98–111)
Creatinine, Ser: 1.06 mg/dL (ref 0.61–1.24)
GFR, Estimated: >60 mL/min (ref >60)
Glucose, Bld: 95 mg/dL (ref 70–99)
Potassium: 4.2 mmol/L (ref 3.5–5.1)
Sodium: 151 mmol/L — ABNORMAL HIGH (ref 135–145)
Total Bilirubin: 0.4 mg/dL (ref 0.3–1.2)
Total Protein: 7 g/dL (ref 6.5–8.1)

## 2022-09-07 LAB — CBC WITH DIFFERENTIAL/PLATELET
Abs Immature Granulocytes: 0.03 10*3/uL (ref 0.00–0.07)
Basophils Absolute: 0.1 10*3/uL (ref 0.0–0.1)
Basophils Relative: 1 %
Eosinophils Absolute: 0.1 10*3/uL (ref 0.0–0.5)
Eosinophils Relative: 1 %
HCT: 46.4 % (ref 39.0–52.0)
Hemoglobin: 14 g/dL (ref 13.0–17.0)
Immature Granulocytes: 0 %
Lymphocytes Relative: 15 %
Lymphs Abs: 1.5 10*3/uL (ref 0.7–4.0)
MCH: 24.5 pg — ABNORMAL LOW (ref 26.0–34.0)
MCHC: 30.2 g/dL (ref 30.0–36.0)
MCV: 81.3 fL (ref 80.0–100.0)
Monocytes Absolute: 0.6 10*3/uL (ref 0.1–1.0)
Monocytes Relative: 6 %
Neutro Abs: 8 10*3/uL — ABNORMAL HIGH (ref 1.7–7.7)
Neutrophils Relative %: 77 %
Platelets: 272 10*3/uL (ref 150–400)
RBC: 5.71 MIL/uL (ref 4.22–5.81)
RDW: 15.9 % — ABNORMAL HIGH (ref 11.5–15.5)
WBC: 10.4 10*3/uL (ref 4.0–10.5)
nRBC: 0 % (ref 0.0–0.2)

## 2022-09-07 LAB — URINE CULTURE: Culture: NO GROWTH

## 2022-09-07 LAB — LITHIUM LEVEL: Lithium Lvl: 0.79 mmol/L (ref 0.60–1.20)

## 2022-09-07 LAB — MAGNESIUM: Magnesium: 2.1 mg/dL (ref 1.7–2.4)

## 2022-09-07 MED ORDER — LACTATED RINGERS IV SOLN: INTRAVENOUS | Status: DC

## 2022-09-07 MED ORDER — DIVALPROEX SODIUM 250 MG PO DR TAB
250.0000 mg | DELAYED_RELEASE_TABLET | Freq: Two times a day (BID) | ORAL | Status: DC
  Administered 2022-09-07 – 2022-09-10 (×5): 250 mg via ORAL
  Filled 2022-09-07 (×6): qty 1

## 2022-09-07 NOTE — Progress Notes (Signed)
  Echocardiogram 2D Echocardiogram attempted a 3rd time at 0849. NT states pt is restless and agitated. Will re-attempt as schedule permits.  Milda Smart 09/07/2022, 8:49 AM

## 2022-09-07 NOTE — Progress Notes (Signed)
Went in to do EKG.  Patient was on his side and has a Comptroller.  Patient is fidgety and has on mittens.  Sitter stated that patient has been combative and that she felt he would not be still long enough to capture data for EKG.  Alerted Consulting civil engineer and will inform patient's RN.
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
## 2022-09-07 NOTE — Evaluation (Signed)
Occupational Therapy Evaluation Patient Details Name: [name] MRN: [id number] DOB: [birth year] Today's Date: 09/07/2022   History of Present Illness [name] is a 61 y.o. male with medical history significant of CAD, dementia, bipolar 1 disorder, hyperlipidemia, hypertension, stroke presents to the ED with a chief complaint of dysphagia.  Unfortunately patient is not able to provide any history right now.  He is hypersomnolent after having been agitated through most of the night which was not improved with multiple doses of Ativan.  He then had a dose of Haldol.  Neither when he was agitated nor status post meds as he been able to provide reliable history.  Girlfriend is with him beginning of his ER stay.  She reported that he is having trouble swallowing.  Food seems to drool on the side of his mouth.  She reported one of the nurses that he was able to drink liquids.  She stated been more confused and "out of it."  According to chart review family says that his baseline is able to answer yes or no questions but otherwise incomprehensible.  Further information could be obtained at this time.   Clinical Impression   Pt noted to have no verbal communication during OT evaluation and PT treatment today. Pt was able to follow simple commands like squeezing and letting go of this therapist's fingers. Pt  required mod A for ambulation in the room with much assist to manage RW. Bed mobility requires assist as well. Cognition is a limiting factor for pt's ADL function. B UE strength and A/ROM seems to be intact. Pt was left in bed with NT present. No family present to report pt's baseline cognition or function. Pt will benefit from continued OT in the hospital and recommended venue below to increase balance and endurance for safe ADL's.        Recommendations for follow up therapy are one component of a multi-disciplinary discharge planning process, led by the attending physician.   Recommendations may be updated based on patient status, additional functional criteria and insurance authorization.   Follow Up Recommendations  Skilled nursing-short term rehab (<3 hours/day)     Assistance Recommended at Discharge Frequent or constant Supervision/Assistance  Patient can return home with the following A lot of help with walking and/or transfers;A lot of help with bathing/dressing/bathroom;Assistance with feeding;Assistance with cooking/housework;Help with stairs or ramp for entrance;Assist for transportation;Direct supervision/assist for medications management    Functional Status Assessment  Patient has had a recent decline in their functional status and demonstrates the ability to make significant improvements in function in a reasonable and predictable amount of time.  Equipment Recommendations  None recommended by OT           Precautions / Restrictions Precautions Precautions: Fall Restrictions Weight Bearing Restrictions: No      Mobility Bed Mobility Overal bed mobility: Needs Assistance Bed Mobility: Supine to Sit, Sit to Supine     Supine to sit: Min assist, Mod assist Sit to supine: Max assist, Mod assist   General bed mobility comments: Labored movement to sit up in bed. Assist to lift B LE into bed.    Transfers Overall transfer level: Needs assistance Equipment used: Rolling walker (2 wheels) Transfers: Sit to/from Stand, Bed to chair/wheelchair/BSC Sit to Stand: Mod assist     Step pivot transfers: Mod assist     General transfer comment: Mod to max verbal and tactile cuing for use of RW ; noted to need assist to step with  R LE at times.      Balance Overall balance assessment: Needs assistance Sitting-balance support: Bilateral upper extremity supported, Feet supported Sitting balance-Leahy Scale: Fair Sitting balance - Comments: poor to fair at EOB   Standing balance support: During functional activity, Bilateral upper  extremity supported Standing balance-Leahy Scale: Poor Standing balance comment: using RW                           ADL either performed or assessed with clinical judgement   ADL Overall ADL's : Needs assistance/impaired Eating/Feeding: Set up;Supervision/ safety   Grooming: Minimal assistance;Sitting   Upper Body Bathing: Minimal assistance;Sitting   Lower Body Bathing: Moderate assistance;Sitting/lateral leans   Upper Body Dressing : Minimal assistance;Sitting   Lower Body Dressing: Moderate assistance;Sitting/lateral leans   Toilet Transfer: Moderate assistance;Rolling walker (2 wheels);Ambulation Toilet Transfer Details (indicate cue type and reason): Simulated via sit to stand and ambulation to door before returning to EOB. Toileting- Clothing Manipulation and Hygiene: Maximal assistance;Bed level       Functional mobility during ADLs: Moderate assistance General ADL Comments: Ambulated to door and back with RW and much tactile assist for handling of RW.     Vision Baseline Vision/History:  (unsure) Ability to See in Adequate Light:  (unsure) Patient Visual Report:  (Unsure. Pt is a poor historian with no verbal communication today.) Vision Assessment?:  (Further testing needed. Pt was able to briefly track object at midline but cognitive deficits are likely impacting testing.)                Pertinent Vitals/Pain Pain Assessment Pain Assessment: Faces Faces Pain Scale: No hurt     Hand Dominance  (unsure)   Extremity/Trunk Assessment Upper Extremity Assessment Upper Extremity Assessment: Difficult to assess due to impaired cognition (Pt demonstrates [facility] P/ROM of shoulder flexion. Good grip strength and ability to use RW. Difficult to fully assess due to cognitive deficits.)   Lower Extremity Assessment Lower Extremity Assessment: Defer to PT evaluation   Cervical / Trunk Assessment Cervical / Trunk Assessment: Normal   Communication  Communication Communication: Expressive difficulties   Cognition Arousal/Alertness: Awake/alert Behavior During Therapy: Impulsive Overall Cognitive Status: No family/caregiver present to determine baseline cognitive functioning                                 General Comments: Pt able to follow commands to squeezing and let go of therapist's fingers.                      Home Living Family/patient expects to be discharged to:: Private residence Living Arrangements: Spouse/significant other Available Help at Discharge: Family;Available 24 hours/day Type of Home: House Home Access: Stairs to enter [employer] of Steps: 3 Entrance Stairs-Rails: Right;Left Home Layout: One level     Bathroom Shower/Tub: Producer, television/film/video: Standard Bathroom Accessibility: Yes   Home Equipment: Cane - single point   Additional Comments: Taken per chart review.      Prior Functioning/Environment Prior Level of Function : Needs assist       Physical Assist : Mobility (physical) Mobility (physical): Bed mobility;Transfers;Gait;Stairs   Mobility Comments: household ambulator "patient is poor historian" (per chart review) ADLs Comments: assisted by family (per chart review; pt is a poor historian.)        OT Problem List: Decreased activity tolerance;Impaired balance (sitting  and/or standing);Decreased coordination;Decreased safety awareness      OT Treatment/Interventions: Self-care/ADL training;Therapeutic exercise;Therapeutic activities;Patient/family education;Balance training;Cognitive remediation/compensation    OT Goals(Current goals can be found in the care plan section) Acute Rehab OT Goals Patient Stated Goal: none stated OT Goal Formulation: Patient unable to participate in goal setting Time For Goal Achievement: 09/21/22 Potential to Achieve Goals: Fair  OT Frequency: Min 2X/week    Co-evaluation PT/OT/SLP  Co-Evaluation/Treatment: Yes Reason for Co-Treatment: Complexity of the patient's impairments (multi-system involvement)   OT goals addressed during session: ADL's and self-care                       End of Session Equipment Utilized During Treatment: Rolling walker (2 wheels);Gait belt Nurse Communication: Other (comment) (NT present during session.)  Activity Tolerance: Patient tolerated treatment well Patient left: in bed;with call bell/phone within reach;with family/visitor present  OT Visit Diagnosis: Unsteadiness on feet (R26.81);Other abnormalities of gait and mobility (R26.89);Muscle weakness (generalized) (M62.81);Cognitive communication deficit (R41.841)                Time: 7622-6333 OT Time Calculation (min): 11 min Charges:  OT General Charges $OT Visit: 1 Visit OT Evaluation $OT Eval Low Complexity: 1 Low  Shetara Launer OT, MOT   [name] 09/07/2022, 11:02 AM

## 2022-09-07 NOTE — Progress Notes (Signed)
Progress Note    [name]   FOY:[id number]  DOB: Apr 05, 1961  DOA: 09/05/2022     1 PCP: [facility]  Initial CC: AMS  Hospital Course: Mr. Kissoon is a 61 yo male with PMH dementia, bipolar 1 disorder, HLD, HTN, CAD, CVA who presented with confusion and dysphagia. With further workup he was also found to be agitated/impulsive, and unable to be redirected.  He required Haldol and Ativan.  His significant other was concerned about his inability to swallow and he was brought for further workup. Because of the difficulty eating/dysphagia, he was initially initiated on a stroke workup.  However, further workup also was notable for an elevated lithium level.  Given his presenting symptoms, it was felt that they were attributed to lithium toxicity.  Poison control was called and patient was started on fluids and admitted.  Interval History:  No events overnight.  Sitter still present bedside.  He is still impulsive and tossing and turning in bed but does appear to have a little bit more intention with his movements today.  Still cannot follow any commands.  Assessment and Plan: * Lithium toxicity - Lithium level 1.51 on admission -Etiology possibly due to chronic toxicity versus possible extra doses unintentionally (wife not certain) - Patient presents with predominate neuro signs (agitation, confusion, dysphagia) supporting chronic toxicity; also cardiac effect with prolonged Qtc -Poison center aware - Continue fluids - Continue trending lithium level; has now normalized - Caution use of Haldol for his agitation.  Try to use Ativan more - likely okay to resume lithium (at lower dose) but he's still not able to take anything orally yet; hopefully mentation will improve soon, but with neuro toxicity can sometimes take several weeks - will try and place tele-psych consult at well for rec's with bipolar treatment  Acute metabolic encephalopathy -  Etiology attributed to probable lithium toxicity - CT head unremarkable for acute findings.  Chronic left frontal lobe infarct appreciated -MRI brain also negative for acute stroke -Patient is reported to have some altered mentation at baseline as well; per wife, he is nonverbal at baseline -Continue sitter  Prolonged QT interval - Likely related to elevated lithium level - Monitor on telemetry - Hold QT prolonging agents when possible - Monitor and replace electrolytes as indicated  Dysphagia - Suspected due to lithium toxicity - did have recent EGD June 2023, cannot see results, but per GI had gastritis and Candida esophagitis -Hopeful that dysphagia improves as lithium level downtrends - evaluated by SLP; once mentation can improve further can try offering dysphagia diet  Hyperlipidemia - hold statin  Bipolar 1 disorder (HCC) - see lithium toxicity  - haldol / ativan PRN  Old records reviewed in assessment of this patient  Antimicrobials:   DVT prophylaxis:  heparin injection 5,000 Units Start: 09/05/22 2200 SCD's Start: 09/05/22 2038   Code Status:   Code Status: Full Code  Mobility Assessment (last 72 hours)     Mobility Assessment     Row Name 09/07/22 1351 09/07/22 1000 09/06/22 2205 09/06/22 1433 09/06/22 02:08:26   Does patient have an order for bedrest or is patient medically unstable -- -- No - Continue assessment -- No - Continue assessment   What is the highest level of mobility based on the progressive mobility assessment? Level 5 (Walks with assist in room/hall) - Balance while stepping forward/back and can walk in room with assist - Complete Level 5 (Walks with assist in room/hall) -  Balance while stepping forward/back and can walk in room with assist - Complete Level 4 (Walks with assist in room) - Balance while marching in place and cannot step forward and back - Complete Level 4 (Walks with assist in room) - Balance while marching in place and cannot step  forward and back - Complete Level 1 (Bedfast) - Unable to balance while sitting on edge of bed            Barriers to discharge:  Disposition Plan: Home 2 to 3 days Status is: Inpatient  Objective: Blood pressure (!) 117/92, pulse 82, temperature 97.9 F (36.6 C), temperature source Oral, resp. rate 19, height 5\' 8"  (1.727 m), weight 59.4 kg, SpO2 100 %.  Examination:  Physical Exam Constitutional:      Comments: Adult man appearing older than stated age lying in bed with mittens in place and sitter bedside and he is unable to follow commands and is unable to be redirected and appears still agitated and impulsive  HENT:     Head: Normocephalic and atraumatic.     Mouth/Throat:     Mouth: Mucous membranes are dry.  Eyes:     Extraocular Movements: Extraocular movements intact.     Pupils: Pupils are equal, round, and reactive to light.  Cardiovascular:     Rate and Rhythm: Normal rate and regular rhythm.  Pulmonary:     Effort: Pulmonary effort is normal.     Breath sounds: Normal breath sounds.  Abdominal:     General: Bowel sounds are normal. There is no distension.     Palpations: Abdomen is soft.     Tenderness: There is no abdominal tenderness.  Musculoskeletal:        General: No swelling.     Cervical back: Normal range of motion and neck supple.  Skin:    General: Skin is warm and dry.  Neurological:     Comments: Moving all 4 extremities spontaneously and unable to follow any commands      Consultants:    Procedures:    Data Reviewed: Results for orders placed or performed during the hospital encounter of 09/05/22 (from the past 24 hour(s))  CBC with Differential/Platelet     Status: Abnormal   Collection Time: 09/07/22  4:45 AM  Result Value Ref Range   WBC 10.4 4.0 - 10.5 K/uL   RBC 5.71 4.22 - 5.81 MIL/uL   Hemoglobin 14.0 13.0 - 17.0 g/dL   HCT 09/09/22 99.2 - 42.6 %   MCV 81.3 80.0 - 100.0 fL   MCH 24.5 (L) 26.0 - 34.0 pg   MCHC 30.2 30.0 - 36.0  g/dL   RDW 83.4 (H) 19.6 - 22.2 %   Platelets 272 150 - 400 K/uL   nRBC 0.0 0.0 - 0.2 %   Neutrophils Relative % 77 %   Neutro Abs 8.0 (H) 1.7 - 7.7 K/uL   Lymphocytes Relative 15 %   Lymphs Abs 1.5 0.7 - 4.0 K/uL   Monocytes Relative 6 %   Monocytes Absolute 0.6 0.1 - 1.0 K/uL   Eosinophils Relative 1 %   Eosinophils Absolute 0.1 0.0 - 0.5 K/uL   Basophils Relative 1 %   Basophils Absolute 0.1 0.0 - 0.1 K/uL   Immature Granulocytes 0 %   Abs Immature Granulocytes 0.03 0.00 - 0.07 K/uL  Comprehensive metabolic panel     Status: Abnormal   Collection Time: 09/07/22  4:45 AM  Result Value Ref Range   Sodium 151 (H)  135 - 145 mmol/L   Potassium 4.2 3.5 - 5.1 mmol/L   Chloride 122 (H) 98 - 111 mmol/L   CO2 26 22 - 32 mmol/L   Glucose, Bld 95 70 - 99 mg/dL   BUN 12 8 - 23 mg/dL   Creatinine, Ser 7.86 0.61 - 1.24 mg/dL   Calcium 75.4 (H) 8.9 - 10.3 mg/dL   Total Protein 7.0 6.5 - 8.1 g/dL   Albumin 3.5 3.5 - 5.0 g/dL   AST 23 15 - 41 U/L   ALT 21 0 - 44 U/L   Alkaline Phosphatase 151 (H) 38 - 126 U/L   Total Bilirubin 0.4 0.3 - 1.2 mg/dL   GFR, Estimated >49 >20 mL/min   Anion gap 3 (L) 5 - 15  Magnesium     Status: None   Collection Time: 09/07/22  4:45 AM  Result Value Ref Range   Magnesium 2.1 1.7 - 2.4 mg/dL  Lithium level     Status: None   Collection Time: 09/07/22  4:45 AM  Result Value Ref Range   Lithium Lvl 0.79 0.60 - 1.20 mmol/L    I have Reviewed nursing notes, Vitals, and Lab results since pt's last encounter. Pertinent lab results : see above I have ordered labwork to follow up on.  I have reviewed the last note from staff over past 24 hours I have discussed pt's care plan and test results with nursing staff, CM/SW, and other staff as appropriate  Time spent: Greater than 50% of the 55 minute visit was spent in counseling/coordination of care for the patient as laid out in the A&P.   LOS: 1 day   Lewie Chamber, MD [facility] 09/07/2022, 3:52  PM

## 2022-09-07 NOTE — Plan of Care (Signed)
  Problem: Acute Rehab OT Goals (only OT should resolve) Goal: Pt. Will Perform Grooming Flowsheets (Taken 09/07/2022 1105) Pt Will Perform Grooming:  with supervision  standing Goal: Pt. Will Perform Upper Body Bathing Flowsheets (Taken 09/07/2022 1105) Pt Will Perform Upper Body Bathing:  with supervision  sitting Goal: Pt. Will Perform Lower Body Bathing Flowsheets (Taken 09/07/2022 1105) Pt Will Perform Lower Body Bathing:  with supervision  sitting/lateral leans Goal: Pt. Will Perform Upper Body Dressing Flowsheets (Taken 09/07/2022 1105) Pt Will Perform Upper Body Dressing:  with supervision  sitting Goal: Pt. Will Perform Lower Body Dressing Flowsheets (Taken 09/07/2022 1105) Pt Will Perform Lower Body Dressing:  with supervision  sitting/lateral leans Goal: Pt. Will Transfer To Toilet Flowsheets (Taken 09/07/2022 1105) Pt Will Transfer to Toilet:  with supervision  ambulating Goal: Pt. Will Perform Toileting-Clothing Manipulation Flowsheets (Taken 09/07/2022 1105) Pt Will Perform Toileting - Clothing Manipulation and hygiene:  with min guard assist  sitting/lateral leans  Sanika Brosious OT, MOT

## 2022-09-07 NOTE — Progress Notes (Signed)
Transition of Care (TOC) -30 day Note       Patient Details  Name: [name] Date of Birth: 02-13-1961   [facility]) CM/SW Contact  Name: Elliot Gault, LCSW  Phone Number: [phone] Date: 09/07/2022 Time: 1242    To Whom it May Concern:   Please be advised that the above patient has a primary diagnosis of dementia which supercedes any psychiatric diagnosis and will require a short-term nursing home stay, anticipated 30 days or less rehabilitation and strengthening. The plan is for return home.

## 2022-09-07 NOTE — Consult Note (Signed)
   HPI:  [name] is a 61 year old male with past history of dementia, bipolar 1 disorder, HLD, HTN, CAD, CVA who presented to [facility] via family with confusion and dysphagia where patient was noted to have lithium toxicity. Lithium level 1.51 on admission. Based on predominate neuro signs and prolonged qTc patient believed to have chronic toxicity. He remains on medical unit where he continues to require 1:1 care due to continued agitation. Lithium held. WBC 17,000, rest of cell lines within normal limits, CMP with creatinine 1.24 and BUN of 24, normal liver function and renal function UDS+THC.   24 hour chart review: Patient remains on medical surgical unit where he requires 1:1 sitter care due to continued agitation and confusion. Patient has been refusing care and becomes combative at times. IVF NS 125 mL/hr stopped this am per Poison Control. Has received prn Ativan 2 mg PRN x6, Ativan 1 mg x1 for MRI, Haldol 2 mg q6hrs PRN x1 for agitation. He remains on Dysphagia 1 (puree); thin liquid diet for dysphagia.   Assessment: provider attempted to see this patient via tele-assessment; patient appears to be unavailable. Medication recommendations noted below.   Medications:  Bipolar 1 disorder:     - Stop:   - Lithium 300 mg q12 hrs    - Start:   - Depakote 250 mg q12 hrs   - may increase every 3rd day  after obtaining labs.  - Lab ordered for 09/09/22 Agitation:    - Continue:  - Haldol 2 mg IV q 6 hrs PRN   Agitation  Patient remains under care of medical team at this time Psychiatry will remain available for any future recommendations as needed.

## 2022-09-07 NOTE — Progress Notes (Signed)
Physical Therapy Treatment Patient Details Name: [name] MRN: [id number] DOB: [birth year] Today's Date: 09/07/2022   History of Present Illness [name] is a 61 y.o. male with medical history significant of CAD, dementia, bipolar 1 disorder, hyperlipidemia, hypertension, stroke presents to the ED with a chief complaint of dysphagia.  Unfortunately patient is not able to provide any history right now.  He is hypersomnolent after having been agitated through most of the night which was not improved with multiple doses of Ativan.  He then had a dose of Haldol.  Neither when he was agitated nor status post meds as he been able to provide reliable history.  Girlfriend is with him beginning of his ER stay.  She reported that he is having trouble swallowing.  Food seems to drool on the side of his mouth.  She reported one of the nurses that he was able to drink liquids.  She stated been more confused and "out of it."  According to chart review family says that his baseline is able to answer yes or no questions but otherwise incomprehensible.  Further information could be obtained at this time.    PT Comments    Patient demonstrates slight improvement for following directions with repeated verbal/tactile cueing for safety, tolerated walking up to doorway and back to bedside with frequent scissoring of legs when making turns and slow labored movement requiring Mod/max assist to prevent falling.  Patient put back to bed after therapy with sitter present in room.  Patient will benefit from continued skilled physical therapy in hospital and recommended venue below to increase strength, balance, endurance for safe ADLs and gait.     Recommendations for follow up therapy are one component of a multi-disciplinary discharge planning process, led by the attending physician.  Recommendations may be updated based on patient status, additional functional criteria and insurance authorization.  Follow Up  Recommendations  Skilled nursing-short term rehab (<3 hours/day) Can patient physically be transported by private vehicle: No   Assistance Recommended at Discharge Frequent or constant Supervision/Assistance  Patient can return home with the following A lot of help with bathing/dressing/bathroom;A lot of help with walking and/or transfers;Help with stairs or ramp for entrance;Assistance with cooking/housework   Equipment Recommendations  None recommended by PT    Recommendations for Other Services       Precautions / Restrictions Precautions Precautions: Fall Restrictions Weight Bearing Restrictions: No     Mobility  Bed Mobility Overal bed mobility: Needs Assistance Bed Mobility: Supine to Sit, Sit to Supine     Supine to sit: Min assist, Mod assist Sit to supine: Max assist, Mod assist   General bed mobility comments: increased time, labored movement    Transfers Overall transfer level: Needs assistance Equipment used: Rolling walker (2 wheels) Transfers: Sit to/from Stand, Bed to chair/wheelchair/BSC Sit to Stand: Mod assist   Step pivot transfers: Mod assist       General transfer comment: slight improvement for following directions, very unsteady labored movement when standing    Ambulation/Gait Ambulation/Gait assistance: Mod assist, Max assist Gait Distance (Feet): 20 Feet Assistive device: Rolling walker (2 wheels) Gait Pattern/deviations: Decreased step length - left, Decreased stance time - right, Decreased stride length, Scissoring Gait velocity: slow     General Gait Details: increased endurance/distance for taking steps with slow labored unsteady movement with near loss of balance due to scissoring of legs when making turns requiring Max tactile assistance to move feet   Stairs  Wheelchair Mobility    Modified Rankin (Stroke Patients Only)       Balance Overall balance assessment: Needs assistance Sitting-balance  support: Feet supported, No upper extremity supported Sitting balance-Leahy Scale: Poor Sitting balance - Comments: fair/poor seated at EOB   Standing balance support: Reliant on assistive device for balance, During functional activity, Bilateral upper extremity supported Standing balance-Leahy Scale: Poor Standing balance comment: using RW                            Cognition Arousal/Alertness: Awake/alert Behavior During Therapy: Impulsive Overall Cognitive Status: No family/caregiver present to determine baseline cognitive functioning                                          Exercises      General Comments        Pertinent Vitals/Pain Pain Assessment Pain Assessment: No/denies pain    Home Living                          Prior Function            PT Goals (current goals can now be found in the care plan section) Acute Rehab PT Goals Patient Stated Goal: not stated PT Goal Formulation: With patient Time For Goal Achievement: 09/20/22 Potential to Achieve Goals: Good Progress towards PT goals: Progressing toward goals    Frequency    Min 3X/week      PT Plan Current plan remains appropriate    Co-evaluation PT/OT/SLP Co-Evaluation/Treatment: Yes Reason for Co-Treatment: Complexity of the patient's impairments (multi-system involvement);To address functional/ADL transfers PT goals addressed during session: Mobility/safety with mobility;Balance;Proper use of DME        AM-PAC PT "6 Clicks" Mobility   Outcome Measure  Help needed turning from your back to your side while in a flat bed without using bedrails?: A Little Help needed moving from lying on your back to sitting on the side of a flat bed without using bedrails?: A Lot Help needed moving to and from a bed to a chair (including a wheelchair)?: A Lot Help needed standing up from a chair using your arms (e.g., wheelchair or bedside chair)?: A Lot Help needed  to walk in hospital room?: A Lot Help needed climbing 3-5 steps with a railing? : Total 6 Click Score: 12    End of Session Equipment Utilized During Treatment: Gait belt Activity Tolerance: Patient tolerated treatment well;Patient limited by fatigue Patient left: in bed;with call bell/phone within reach;with nursing/sitter in room Nurse Communication: Mobility status PT Visit Diagnosis: Unsteadiness on feet (R26.81);Other abnormalities of gait and mobility (R26.89);Muscle weakness (generalized) (M62.81)     Time: 6659-9357 PT Time Calculation (min) (ACUTE ONLY): 22 min  Charges:  $Therapeutic Activity: 8-22 mins                     2:10 PM, 09/07/22 Ocie Bob, MPT Physical Therapist with [facility] 336 [phone] office [phone] mobile phone

## 2022-09-07 NOTE — Progress Notes (Signed)
Patient very restless / anxious. PRN ativan given x2. Ativan is effective.

## 2022-09-07 NOTE — TOC Initial Note (Signed)
Transition of Care [facility]) - Initial/Assessment Note    Patient Details  Name: [name] MRN: [id number] Date of Birth: 08-Sep-1961  Transition of Care [facility]) CM/SW Contact:    Elliot Gault, LCSW Phone Number: 09/07/2022, 12:37 PM  Clinical Narrative:                  Pt admitted from home. PT/OT recommending SNF rehab. Pt disoriented at this time. Spoke with pt's sig other to review dc planning and therapy recommendations. Sig other agreeable to SNF referrals for short term rehab. She is aware that if pt makes significant improvement and is able to dc home instead, the plan can be changed.   DC timeframe note yet know. Will start PASRR. Insurance Berkley Harvey will be started when pt more stable and approaching dc. TOC will follow.  Expected Discharge Plan: Skilled Nursing Facility Barriers to Discharge: Continued Medical Work up   Patient Goals and CMS Choice Patient states their goals for this hospitalization and ongoing recovery are:: get better CMS Medicare.gov Compare Post Acute Care list provided to:: Patient Represenative (must comment) Choice offered to / list presented to : Spouse      Expected Discharge Plan and Services In-house Referral: Clinical Social Work   Post Acute Care Choice: Skilled Nursing Facility Living arrangements for the past 2 months: Single Family Home                                      Prior Living Arrangements/Services Living arrangements for the past 2 months: Single Family Home Lives with:: Significant Other Patient language and need for interpreter reviewed:: Yes Do you feel safe going back to the place where you live?: Yes      Need for Family Participation in Patient Care: Yes (Comment) Care giver support system in place?: Yes (comment)   Criminal Activity/Legal Involvement Pertinent to Current Situation/Hospitalization: No - Comment as needed  Activities of Daily Living      Permission Sought/Granted Permission sought to  share information with : Facility Industrial/product designer granted to share information with : Yes, Verbal Permission Granted     Permission granted to share info w AGENCY: [facility]        Emotional Assessment       Orientation: : Oriented to Self Alcohol / Substance Use: Not Applicable Psych Involvement: No (comment)  Admission diagnosis:  Esophageal candidiasis (HCC) [B37.81] Lithium toxicity, undetermined intent, initial encounter [T56.894A] Altered mental status, unspecified altered mental status type [R41.82] Acute metabolic encephalopathy [G93.41] Patient Active Problem List   Diagnosis Date Noted   Prolonged QT interval 09/06/2022   Dysphagia 09/06/2022   Lithium toxicity 09/06/2022   Acute metabolic encephalopathy 09/05/2022   Primary osteoarthritis of left knee 04/17/2022   Chronic pain of left knee 02/03/2019   Primary osteoarthritis of right knee 08/11/2018   Total knee replacement status 08/11/2018   Seasonal allergies 12/23/2014   BMI 28.0-28.9,adult 12/18/2014   External hemorrhoid, bleeding 08/20/2013   Bipolar 1 disorder (HCC) 06/17/2013   Hyperlipidemia 06/17/2013   Eczema 06/17/2013   PCP:  [facility]:   [id number] - [place], [place] - [address] [place] [address] Phone: [phone] Fax: [phone]  [facility] [address] [place] [address] [place] [address] Phone: [phone] Fax: [phone]     Social Determinants of Health (  SDOH) Social History: SDOH Screenings   Tobacco Use: Medium Risk (09/06/2022)   SDOH Interventions:     Readmission Risk Interventions    09/07/2022   12:35 PM  Readmission Risk Prevention Plan  Medication Screening Complete  Transportation Screening Complete
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
## 2022-09-07 NOTE — [place] FL2 (Signed)
[place] MEDICAID FL2 LEVEL OF CARE FORM     IDENTIFICATION  Patient Name: [name] Birthdate: 01-31-1961 Sex: male Admission Date (Current Location): 09/05/2022  [facility] and [place] Number:  [employer] and Address:  [facility],  [address] [place] [address]      Provider Number: [id number]  Attending Physician Name and Address:  Lewie Chamber, MD  Relative Name and Phone Number:       Current Level of Care: Hospital Recommended Level of Care: Skilled Nursing Facility Prior Approval Number:    Date Approved/Denied:   PASRR Number:    Discharge Plan: SNF    Current Diagnoses: Patient Active Problem List   Diagnosis Date Noted   Prolonged QT interval 09/06/2022   Dysphagia 09/06/2022   Lithium toxicity 09/06/2022   Acute metabolic encephalopathy 09/05/2022   Primary osteoarthritis of left knee 04/17/2022   Chronic pain of left knee 02/03/2019   Primary osteoarthritis of right knee 08/11/2018   Total knee replacement status 08/11/2018   Seasonal allergies 12/23/2014   BMI 28.0-28.9,adult 12/18/2014   External hemorrhoid, bleeding 08/20/2013   Bipolar 1 disorder (HCC) 06/17/2013   Hyperlipidemia 06/17/2013   Eczema 06/17/2013    Orientation RESPIRATION BLADDER Height & Weight     Self  Normal Incontinent Weight: 131 lb (59.4 kg) Height:  5\' 8"  (172.7 cm)  BEHAVIORAL SYMPTOMS/MOOD NEUROLOGICAL BOWEL NUTRITION STATUS      Continent Diet (see dc summary)  AMBULATORY STATUS COMMUNICATION OF NEEDS Skin   Extensive Assist Verbally Normal                       Personal Care Assistance Level of Assistance  Bathing, Feeding, Dressing Bathing Assistance: Limited assistance Feeding assistance: Independent Dressing Assistance: Limited assistance     Functional Limitations Info  Sight, Hearing, Speech Sight Info: Adequate Hearing Info: Adequate Speech Info: Impaired    SPECIAL CARE FACTORS FREQUENCY  PT (By  licensed PT), OT (By licensed OT)     PT Frequency: 5x week OT Frequency: 3x week            Contractures Contractures Info: Not present    Additional Factors Info  Code Status, Allergies, Psychotropic Code Status Info: Full Allergies Info: NKA Psychotropic Info: Lithium         Current Medications (09/07/2022):  This is the current hospital active medication list Current Facility-Administered Medications  Medication Dose Route Frequency Provider Last Rate Last Admin   acetaminophen (TYLENOL) tablet 650 mg  650 mg Oral Q4H PRN Zierle-Ghosh, Asia B, DO       Or   acetaminophen (TYLENOL) 160 MG/5ML solution 650 mg  650 mg Per Tube Q4H PRN Zierle-Ghosh, Asia B, DO       Or   acetaminophen (TYLENOL) suppository 650 mg  650 mg Rectal Q4H PRN Zierle-Ghosh, Asia B, DO       haloperidol lactate (HALDOL) injection 2 mg  2 mg Intravenous Q6H PRN 09/09/2022, MD   2 mg at 09/06/22 0818   heparin injection 5,000 Units  5,000 Units Subcutaneous Q8H Zierle-Ghosh, Asia B, DO   5,000 Units at 09/07/22 0555   lactated ringers infusion   Intravenous Continuous 09/09/22, MD 75 mL/hr at 09/07/22 0906 New Bag at 09/07/22 0906   LORazepam (ATIVAN) injection 1 mg  1 mg Intravenous PRN Zierle-Ghosh, Asia B, DO   1 mg at 09/06/22 1008   LORazepam (ATIVAN) injection 2 mg  2  mg Intravenous Q4H PRN Lewie Chamber, MD   2 mg at 09/07/22 1008   nystatin (MYCOSTATIN) 100000 UNIT/ML suspension 500,000 Units  5 mL Mouth/Throat QID Zierle-Ghosh, Asia B, DO   500,000 Units at 09/06/22 2202     Discharge Medications: Please see discharge summary for a list of discharge medications.  Relevant Imaging Results:  Relevant Lab Results:   Additional Information SSN: [id number]  Elliot Gault, LCSW

## 2022-09-07 NOTE — Progress Notes (Signed)
SLP Cancellation Note  Patient Details Name: [name] MRN: [id number] DOB: [birth year]   Cancelled treatment:       Reason Eval/Treat Not Completed: Medical issues which prohibited therapy;Fatigue/lethargy limiting ability to participate. Pt is not appropriate for PO this am, Reviewed recommendations from BSE with LPN.   Recommendations for PO ONLY WHEN PT IS ALERT "Recommend D1/puree and thin liquids via feeder controlled straw sips (do not let him drink freely) by removing straw from his lips after one sip, PO medications crushed as able in puree or ice cream. Pt will need 100% feeder assist and should only eat/drink when alert and upright."  ST will continue efforts - thank you for this referral, Jezabel Lecker H. Romie Levee, CCC-SLP Speech Language Pathologist    Georgetta Haber 09/07/2022, 9:18 AM

## 2022-09-08 ENCOUNTER — Inpatient Hospital Stay (HOSPITAL_COMMUNITY): Payer: Medicare Other

## 2022-09-08 ENCOUNTER — Other Ambulatory Visit (HOSPITAL_COMMUNITY): Payer: Self-pay | Admitting: *Deleted

## 2022-09-08 DIAGNOSIS — I6389 Other cerebral infarction: Secondary | ICD-10-CM

## 2022-09-08 DIAGNOSIS — T56894A Toxic effect of other metals, undetermined, initial encounter: Secondary | ICD-10-CM

## 2022-09-08 DIAGNOSIS — T56891A Toxic effect of other metals, accidental (unintentional), initial encounter: Secondary | ICD-10-CM | POA: Diagnosis not present

## 2022-09-08 DIAGNOSIS — E870 Hyperosmolality and hypernatremia: Secondary | ICD-10-CM

## 2022-09-08 DIAGNOSIS — G9341 Metabolic encephalopathy: Secondary | ICD-10-CM | POA: Diagnosis not present

## 2022-09-08 DIAGNOSIS — R9431 Abnormal electrocardiogram [ECG] [EKG]: Secondary | ICD-10-CM | POA: Diagnosis not present

## 2022-09-08 LAB — ECHOCARDIOGRAM COMPLETE
AR max vel: 2.16 cm2
AV Area VTI: 2.53 cm2
AV Area mean vel: 2.56 cm2
AV Mean grad: 1.9 mmHg
AV Peak grad: 6.1 mmHg
Ao pk vel: 1.23 m/s
Area-P 1/2: 2.8 cm2
Height: 68 in
S' Lateral: 2.5 cm
Weight: 2096 oz

## 2022-09-08 LAB — CBC WITH DIFFERENTIAL/PLATELET
Abs Immature Granulocytes: 0.03 10*3/uL (ref 0.00–0.07)
Basophils Absolute: 0.1 10*3/uL (ref 0.0–0.1)
Basophils Relative: 1 %
Eosinophils Absolute: 0.1 10*3/uL (ref 0.0–0.5)
Eosinophils Relative: 1 %
HCT: 51.4 % (ref 39.0–52.0)
Hemoglobin: 15.6 g/dL (ref 13.0–17.0)
Immature Granulocytes: 0 %
Lymphocytes Relative: 17 %
Lymphs Abs: 1.8 10*3/uL (ref 0.7–4.0)
MCH: 24.5 pg — ABNORMAL LOW (ref 26.0–34.0)
MCHC: 30.4 g/dL (ref 30.0–36.0)
MCV: 80.8 fL (ref 80.0–100.0)
Monocytes Absolute: 0.7 10*3/uL (ref 0.1–1.0)
Monocytes Relative: 7 %
Neutro Abs: 8 10*3/uL — ABNORMAL HIGH (ref 1.7–7.7)
Neutrophils Relative %: 74 %
Platelets: 313 10*3/uL (ref 150–400)
RBC: 6.36 MIL/uL — ABNORMAL HIGH (ref 4.22–5.81)
RDW: 17.1 % — ABNORMAL HIGH (ref 11.5–15.5)
WBC: 10.8 10*3/uL — ABNORMAL HIGH (ref 4.0–10.5)
nRBC: 0 % (ref 0.0–0.2)

## 2022-09-08 LAB — BASIC METABOLIC PANEL
Anion gap: 6 (ref 5–15)
BUN: 19 mg/dL (ref 8–23)
CO2: 23 mmol/L (ref 22–32)
Calcium: 10 mg/dL (ref 8.9–10.3)
Chloride: 125 mmol/L — ABNORMAL HIGH (ref 98–111)
Creatinine, Ser: 1.12 mg/dL (ref 0.61–1.24)
GFR, Estimated: >60 mL/min (ref >60)
Glucose, Bld: 98 mg/dL (ref 70–99)
Potassium: 3.7 mmol/L (ref 3.5–5.1)
Sodium: 154 mmol/L — ABNORMAL HIGH (ref 135–145)

## 2022-09-08 LAB — COMPREHENSIVE METABOLIC PANEL
ALT: 27 U/L (ref 0–44)
AST: 30 U/L (ref 15–41)
Albumin: 3.7 g/dL (ref 3.5–5.0)
Alkaline Phosphatase: 152 U/L — ABNORMAL HIGH (ref 38–126)
Anion gap: 7 (ref 5–15)
BUN: 15 mg/dL (ref 8–23)
CO2: 25 mmol/L (ref 22–32)
Calcium: 10.6 mg/dL — ABNORMAL HIGH (ref 8.9–10.3)
Chloride: 124 mmol/L — ABNORMAL HIGH (ref 98–111)
Creatinine, Ser: 1.09 mg/dL (ref 0.61–1.24)
GFR, Estimated: >60 mL/min (ref >60)
Glucose, Bld: 94 mg/dL (ref 70–99)
Potassium: 4.5 mmol/L (ref 3.5–5.1)
Sodium: 156 mmol/L — ABNORMAL HIGH (ref 135–145)
Total Bilirubin: 0.3 mg/dL (ref 0.3–1.2)
Total Protein: 7.3 g/dL (ref 6.5–8.1)

## 2022-09-08 LAB — LITHIUM LEVEL: Lithium Lvl: 0.55 mmol/L — ABNORMAL LOW (ref 0.60–1.20)

## 2022-09-08 LAB — T4, FREE: Free T4: 0.91 ng/dL (ref 0.61–1.12)

## 2022-09-08 LAB — MAGNESIUM: Magnesium: 2 mg/dL (ref 1.7–2.4)

## 2022-09-08 LAB — TSH: TSH: 0.955 u[IU]/mL (ref 0.350–4.500)

## 2022-09-08 LAB — HEMOGLOBIN A1C
Hgb A1c MFr Bld: 5.8 % — ABNORMAL HIGH (ref 4.8–5.6)
Mean Plasma Glucose: 120 mg/dL

## 2022-09-08 MED ORDER — BENZTROPINE MESYLATE 1 MG/ML IJ SOLN: 1.0000 mg | Freq: Two times a day (BID) | INTRAMUSCULAR | Status: DC | PRN

## 2022-09-08 MED ORDER — BENZTROPINE MESYLATE 1 MG PO TABS: 0.5000 mg | ORAL_TABLET | Freq: Two times a day (BID) | ORAL | Status: DC | PRN

## 2022-09-08 MED ORDER — BENZTROPINE MESYLATE 1 MG PO TABS: 1.0000 mg | ORAL_TABLET | Freq: Two times a day (BID) | ORAL | Status: DC | PRN

## 2022-09-08 MED ORDER — BENZTROPINE MESYLATE 1 MG/ML IJ SOLN
0.5000 mg | Freq: Two times a day (BID) | INTRAMUSCULAR | Status: DC | PRN
  Administered 2022-09-08: 0.5 mg via INTRAMUSCULAR
  Filled 2022-09-08: qty 2
  Filled 2022-09-08: qty 0.5

## 2022-09-08 NOTE — Progress Notes (Signed)
*  PRELIMINARY RESULTS* Echocardiogram 2D Echocardiogram has been performed. Patient very uncooperative. Best attempt.   [name] 09/08/2022, 10:45 AM

## 2022-09-08 NOTE — Progress Notes (Signed)
Progress Note    [facility] ESTERLINE   YYT:[id number]  DOB: 1961-09-10  DOA: 09/05/2022     2 PCP: [facility]  Initial CC: AMS  Hospital Course: [name] is a 61 yo male with PMH dementia, bipolar 1 disorder, HLD, HTN, CAD, CVA who presented with confusion and dysphagia. With further workup he was also found to be agitated/impulsive, and unable to be redirected.  He required Haldol and Ativan.  His significant other was concerned about his inability to swallow and he was brought for further workup. Because of the difficulty eating/dysphagia, he was initially initiated on a stroke workup.  However, further workup also was notable for an elevated lithium level.  Given his presenting symptoms, it was felt that they were attributed to lithium toxicity.  Poison control was called and patient was started on fluids and admitted.  Interval History:  No events overnight.  Sitter still present bedside.  He remains impulsive and still trying to climb out of bed at times.  Still being given frequent doses of Ativan.  Assessment and Plan: * Lithium toxicity - Lithium level 1.51 on admission -Etiology possibly due to chronic toxicity versus possible extra doses unintentionally (wife not certain) - Patient presents with predominate neuro signs (agitation, confusion, dysphagia) supporting chronic toxicity; also cardiac effect with prolonged Qtc -Poison center aware - Continue fluids - Continue trending lithium level; has now normalized - Caution use of Haldol for his agitation.  Try to use Ativan more - likely okay to resume lithium (at lower dose) but he's still not able to take anything orally yet; hopefully mentation will improve soon, but with neuro toxicity can sometimes take several weeks - appreciate tele-psych rec's; he has been taken off lithium and started on depakote; continue p.o. for now and if necessary, we can transition to IV  Acute metabolic  encephalopathy - Etiology attributed to probable lithium toxicity - CT head unremarkable for acute findings.  Chronic left frontal lobe infarct appreciated -MRI brain also negative for acute stroke -Patient is reported to have some altered mentation at baseline as well; per wife, he is nonverbal at baseline -Continue sitter  Hypernatremia - Na started to increase on NS and was changed to LR - still further increase in Na today - repeat BMP this afternoon, if still rising, will need to change to D5W   Prolonged QT interval - Likely related to elevated lithium level - Monitor on telemetry - Hold QT prolonging agents when possible - Monitor and replace electrolytes as indicated  Dysphagia - Suspected due to lithium toxicity - did have recent EGD June 2023, cannot see results, but per GI had gastritis and Candida esophagitis -Hopeful that dysphagia improves as lithium level downtrends - evaluated by SLP; once mentation can improve further can try offering dysphagia diet  Hyperlipidemia - hold statin  Bipolar 1 disorder (HCC) - see lithium toxicity  - haldol / ativan PRN  Old records reviewed in assessment of this patient  Antimicrobials:   DVT prophylaxis:  heparin injection 5,000 Units Start: 09/05/22 2200 SCD's Start: 09/05/22 2038   Code Status:   Code Status: Full Code  Mobility Assessment (last 72 hours)     Mobility Assessment     Row Name 09/08/22 1010 09/07/22 2133 09/07/22 1351 09/07/22 1000 09/06/22 2205   Does patient have an order for bedrest or is patient medically unstable No - Continue assessment No - Continue assessment -- -- No - Continue assessment  What is the highest level of mobility based on the progressive mobility assessment? Level 5 (Walks with assist in room/hall) - Balance while stepping forward/back and can walk in room with assist - Complete Level 5 (Walks with assist in room/hall) - Balance while stepping forward/back and can walk in room  with assist - Complete Level 5 (Walks with assist in room/hall) - Balance while stepping forward/back and can walk in room with assist - Complete Level 5 (Walks with assist in room/hall) - Balance while stepping forward/back and can walk in room with assist - Complete Level 4 (Walks with assist in room) - Balance while marching in place and cannot step forward and back - Complete    Row Name 09/06/22 1433 09/06/22 02:08:26         Does patient have an order for bedrest or is patient medically unstable -- No - Continue assessment      What is the highest level of mobility based on the progressive mobility assessment? Level 4 (Walks with assist in room) - Balance while marching in place and cannot step forward and back - Complete Level 1 (Bedfast) - Unable to balance while sitting on edge of bed               Barriers to discharge:  Disposition Plan: SNF once medically stable Status is: Inpatient  Objective: Blood pressure 120/71, pulse 83, temperature (!) 97.5 F (36.4 C), temperature source Axillary, resp. rate 18, height 5\' 8"  (1.727 m), weight 59.4 kg, SpO2 98 %.  Examination:  Physical Exam Constitutional:      Comments: Adult man appearing older than stated age lying in bed with mittens in place and sitter bedside and he is unable to follow commands and is unable to be redirected and appears still agitated and impulsive  HENT:     Head: Normocephalic and atraumatic.     Mouth/Throat:     Mouth: Mucous membranes are dry.  Eyes:     Extraocular Movements: Extraocular movements intact.     Pupils: Pupils are equal, round, and reactive to light.  Cardiovascular:     Rate and Rhythm: Normal rate and regular rhythm.  Pulmonary:     Effort: Pulmonary effort is normal.     Breath sounds: Normal breath sounds.  Abdominal:     General: Bowel sounds are normal. There is no distension.     Palpations: Abdomen is soft.     Tenderness: There is no abdominal tenderness.   Musculoskeletal:        General: No swelling.     Cervical back: Normal range of motion and neck supple.  Skin:    General: Skin is warm and dry.  Neurological:     Comments: Moving all 4 extremities spontaneously and unable to follow any commands      Consultants:    Procedures:    Data Reviewed: Results for orders placed or performed during the hospital encounter of 09/05/22 (from the past 24 hour(s))  CBC with Differential/Platelet     Status: Abnormal   Collection Time: 09/08/22  4:56 AM  Result Value Ref Range   WBC 10.8 (H) 4.0 - 10.5 K/uL   RBC 6.36 (H) 4.22 - 5.81 MIL/uL   Hemoglobin 15.6 13.0 - 17.0 g/dL   HCT 51.4 39.0 - 52.0 %   MCV 80.8 80.0 - 100.0 fL   MCH 24.5 (L) 26.0 - 34.0 pg   MCHC 30.4 30.0 - 36.0 g/dL   RDW 17.1 (H) 11.5 -  15.5 %   Platelets 313 150 - 400 K/uL   nRBC 0.0 0.0 - 0.2 %   Neutrophils Relative % 74 %   Neutro Abs 8.0 (H) 1.7 - 7.7 K/uL   Lymphocytes Relative 17 %   Lymphs Abs 1.8 0.7 - 4.0 K/uL   Monocytes Relative 7 %   Monocytes Absolute 0.7 0.1 - 1.0 K/uL   Eosinophils Relative 1 %   Eosinophils Absolute 0.1 0.0 - 0.5 K/uL   Basophils Relative 1 %   Basophils Absolute 0.1 0.0 - 0.1 K/uL   Immature Granulocytes 0 %   Abs Immature Granulocytes 0.03 0.00 - 0.07 K/uL  Comprehensive metabolic panel     Status: Abnormal   Collection Time: 09/08/22  4:56 AM  Result Value Ref Range   Sodium 156 (H) 135 - 145 mmol/L   Potassium 4.5 3.5 - 5.1 mmol/L   Chloride 124 (H) 98 - 111 mmol/L   CO2 25 22 - 32 mmol/L   Glucose, Bld 94 70 - 99 mg/dL   BUN 15 8 - 23 mg/dL   Creatinine, Ser 1.09 0.61 - 1.24 mg/dL   Calcium 10.6 (H) 8.9 - 10.3 mg/dL   Total Protein 7.3 6.5 - 8.1 g/dL   Albumin 3.7 3.5 - 5.0 g/dL   AST 30 15 - 41 U/L   ALT 27 0 - 44 U/L   Alkaline Phosphatase 152 (H) 38 - 126 U/L   Total Bilirubin 0.3 0.3 - 1.2 mg/dL   GFR, Estimated >60 >60 mL/min   Anion gap 7 5 - 15  Magnesium     Status: None   Collection Time: 09/08/22   4:56 AM  Result Value Ref Range   Magnesium 2.0 1.7 - 2.4 mg/dL  Lithium level     Status: Abnormal   Collection Time: 09/08/22  4:56 AM  Result Value Ref Range   Lithium Lvl 0.55 (L) 0.60 - 1.20 mmol/L  TSH     Status: None   Collection Time: 09/08/22  8:56 AM  Result Value Ref Range   TSH 0.955 0.350 - 4.500 uIU/mL  T4, free     Status: None   Collection Time: 09/08/22  8:56 AM  Result Value Ref Range   Free T4 0.91 0.61 - 1.12 ng/dL    I have Reviewed nursing notes, Vitals, and Lab results since pt's last encounter. Pertinent lab results : see above I have ordered labwork to follow up on.  I have reviewed the last note from staff over past 24 hours I have discussed pt's care plan and test results with nursing staff, CM/SW, and other staff as appropriate  Time spent: Greater than 50% of the 55 minute visit was spent in counseling/coordination of care for the patient as laid out in the A&P.   LOS: 2 days   Dwyane Dee, MD [facility] 09/08/2022, 3:38 PM

## 2022-09-08 NOTE — Assessment & Plan Note (Signed)
-   Na started to increase on NS and was changed to LR; still above goal - change IVF to D5W - trend BMP

## 2022-09-08 NOTE — Progress Notes (Addendum)
Patient continues to roll around in the bed. Ativan given x2 thus far and only works for a short period. 1 iv pulled out on this shift. New iv put in on his left upper arm wrapped with Kerlix. Sitter at bedside. Patient is not coherent and will not respond to his name or any other questions.. Disoriented x4.  TTS consulted and stated his orders will be adjusted and cosult neuro. Pt can have haldol within at least a hour from his ativan but make sure when we give haldol to also give cogentin with it.

## 2022-09-08 NOTE — Progress Notes (Addendum)
Patient continues to roll around in the bed. Patient was seen on all fours in the bed. Ativan given multiple times and only works for a short period. Pulled out 2 IV's on this shift by rolling around in the bed. New Iv placed in the right ac wrapped with Kerlix. Sitter at bedside. Patient is not coherent and will not respond to his name or any other questions. Disoriented x4
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
## 2022-09-09 DIAGNOSIS — R799 Abnormal finding of blood chemistry, unspecified: Secondary | ICD-10-CM

## 2022-09-09 DIAGNOSIS — T56891A Toxic effect of other metals, accidental (unintentional), initial encounter: Secondary | ICD-10-CM | POA: Diagnosis not present

## 2022-09-09 DIAGNOSIS — E870 Hyperosmolality and hypernatremia: Secondary | ICD-10-CM | POA: Diagnosis not present

## 2022-09-09 DIAGNOSIS — G9341 Metabolic encephalopathy: Secondary | ICD-10-CM | POA: Diagnosis not present

## 2022-09-09 DIAGNOSIS — R9431 Abnormal electrocardiogram [ECG] [EKG]: Secondary | ICD-10-CM | POA: Diagnosis not present

## 2022-09-09 LAB — COMPREHENSIVE METABOLIC PANEL
ALT: 28 U/L (ref 0–44)
AST: 40 U/L (ref 15–41)
Albumin: 3.5 g/dL (ref 3.5–5.0)
Alkaline Phosphatase: 141 U/L — ABNORMAL HIGH (ref 38–126)
Anion gap: 8 (ref 5–15)
BUN: 20 mg/dL (ref 8–23)
CO2: 25 mmol/L (ref 22–32)
Calcium: 10.4 mg/dL — ABNORMAL HIGH (ref 8.9–10.3)
Chloride: 124 mmol/L — ABNORMAL HIGH (ref 98–111)
Creatinine, Ser: 1.24 mg/dL (ref 0.61–1.24)
GFR, Estimated: >60 mL/min (ref >60)
Glucose, Bld: 80 mg/dL (ref 70–99)
Potassium: 3.9 mmol/L (ref 3.5–5.1)
Sodium: 157 mmol/L — ABNORMAL HIGH (ref 135–145)
Total Bilirubin: 0.7 mg/dL (ref 0.3–1.2)
Total Protein: 6.9 g/dL (ref 6.5–8.1)

## 2022-09-09 LAB — CBC WITH DIFFERENTIAL/PLATELET
Abs Immature Granulocytes: 0.02 10*3/uL (ref 0.00–0.07)
Basophils Absolute: 0.1 10*3/uL (ref 0.0–0.1)
Basophils Relative: 1 %
Eosinophils Absolute: 0.1 10*3/uL (ref 0.0–0.5)
Eosinophils Relative: 1 %
HCT: 51.1 % (ref 39.0–52.0)
Hemoglobin: 15.5 g/dL (ref 13.0–17.0)
Immature Granulocytes: 0 %
Lymphocytes Relative: 18 %
Lymphs Abs: 1.8 10*3/uL (ref 0.7–4.0)
MCH: 24.3 pg — ABNORMAL LOW (ref 26.0–34.0)
MCHC: 30.3 g/dL (ref 30.0–36.0)
MCV: 80.2 fL (ref 80.0–100.0)
Monocytes Absolute: 0.7 10*3/uL (ref 0.1–1.0)
Monocytes Relative: 7 %
Neutro Abs: 7.5 10*3/uL (ref 1.7–7.7)
Neutrophils Relative %: 73 %
Platelets: 290 10*3/uL (ref 150–400)
RBC: 6.37 MIL/uL — ABNORMAL HIGH (ref 4.22–5.81)
RDW: 17.5 % — ABNORMAL HIGH (ref 11.5–15.5)
WBC: 10.2 10*3/uL (ref 4.0–10.5)
nRBC: 0 % (ref 0.0–0.2)

## 2022-09-09 LAB — VALPROIC ACID LEVEL: Valproic Acid Lvl: <10 ug/mL — ABNORMAL LOW (ref 50.0–100.0)

## 2022-09-09 LAB — BASIC METABOLIC PANEL
Anion gap: 5 (ref 5–15)
BUN: 23 mg/dL (ref 8–23)
CO2: 26 mmol/L (ref 22–32)
Calcium: 10.1 mg/dL (ref 8.9–10.3)
Chloride: 123 mmol/L — ABNORMAL HIGH (ref 98–111)
Creatinine, Ser: 1.29 mg/dL — ABNORMAL HIGH (ref 0.61–1.24)
GFR, Estimated: >60 mL/min (ref >60)
Glucose, Bld: 121 mg/dL — ABNORMAL HIGH (ref 70–99)
Potassium: 4.5 mmol/L (ref 3.5–5.1)
Sodium: 154 mmol/L — ABNORMAL HIGH (ref 135–145)

## 2022-09-09 LAB — MAGNESIUM: Magnesium: 2.2 mg/dL (ref 1.7–2.4)

## 2022-09-09 MED ORDER — DEXTROSE 5 % IV SOLN: INTRAVENOUS | Status: DC

## 2022-09-09 NOTE — Progress Notes (Signed)
Progress Note    [name]   [id number]:[id number]  DOB: 04-20-1961  DOA: 09/05/2022     3 PCP: [facility]  Initial CC: AMS  Hospital Course: [name] is a 62 yo male with PMH dementia, bipolar 1 disorder, HLD, HTN, CAD, CVA who presented with confusion and dysphagia. With further workup he was also found to be agitated/impulsive, and unable to be redirected.  He required Haldol and Ativan.  His significant other was concerned about his inability to swallow and he was brought for further workup. Because of the difficulty eating/dysphagia, he was initially initiated on a stroke workup.  However, further workup also was notable for an elevated lithium level.  Given his presenting symptoms, it was felt that they were attributed to lithium toxicity.  Poison control was called and patient was started on fluids and admitted.  Interval History:  No events overnight.  Sitter still present bedside.  Still unable to follow commands when seen this morning.  He was resting more comfortably and harder to arouse than usual but he was noted to attempt a few bites of food with the sitter this morning for breakfast.  Assessment and Plan: * Lithium toxicity - Lithium level 1.51 on admission -Etiology possibly due to chronic toxicity versus possible extra doses unintentionally (wife not certain) - Patient presents with predominate neuro signs (agitation, confusion, dysphagia) supporting chronic toxicity; also cardiac effect with prolonged Qtc -Poison center aware - Continue fluids - Continue trending lithium level; has now normalized - Caution use of Haldol for his agitation.  Try to use Ativan more - likely okay to resume lithium (at lower dose) but he's still not able to take anything orally yet; hopefully mentation will improve soon, but with neuro toxicity can sometimes take several weeks - appreciate tele-psych rec's; he has been taken off lithium and started on  depakote; continue p.o. for now and if necessary, we can transition to IV  Acute metabolic encephalopathy - Etiology attributed to probable lithium toxicity - CT head unremarkable for acute findings.  Chronic left frontal lobe infarct appreciated -MRI brain also negative for acute stroke -Patient is reported to have some altered mentation at baseline as well; per wife, he is nonverbal at baseline -Continue sitter  Hypernatremia - Na started to increase on NS and was changed to LR; still above goal - change IVF to D5W - trend BMP  Prolonged QT interval - Likely related to elevated lithium level - patient pulling off tele too much for meaning capture; okay to d/c - serial EKGs as he allows too - Hold QT prolonging agents when possible - Monitor and replace electrolytes as indicated  Dysphagia - Suspected due to lithium toxicity - did have recent EGD June 2023, cannot see results, but per GI had gastritis and Candida esophagitis -Hopeful that dysphagia improves as lithium level downtrends - evaluated by SLP; once mentation can improve further can try offering dysphagia diet  Hyperlipidemia - hold statin  Bipolar 1 disorder (HCC) - see lithium toxicity  - haldol / ativan PRN  Contamination of blood culture-resolved as of 09/09/2022 - 1/4 bottles noted with GPC from 12/20 but apparently nothing seen on media per micro. Has remained afebrile with minimal leukocytosis (considered reactive from other processes) - will repeat blood culture if becomes febrile  Old records reviewed in assessment of this patient  Antimicrobials:   DVT prophylaxis:  heparin injection 5,000 Units Start: 09/05/22 2200 SCD's Start: 09/05/22 2038  Code Status:   Code Status: Full Code  Mobility Assessment (last 72 hours)     Mobility Assessment     Row Name 09/09/22 1000 09/08/22 1010 09/07/22 2133 09/07/22 1351 09/07/22 1000   Does patient have an order for bedrest or is patient medically  unstable No - Continue assessment No - Continue assessment No - Continue assessment -- --   What is the highest level of mobility based on the progressive mobility assessment? Level 5 (Walks with assist in room/hall) - Balance while stepping forward/back and can walk in room with assist - Complete Level 5 (Walks with assist in room/hall) - Balance while stepping forward/back and can walk in room with assist - Complete Level 5 (Walks with assist in room/hall) - Balance while stepping forward/back and can walk in room with assist - Complete Level 5 (Walks with assist in room/hall) - Balance while stepping forward/back and can walk in room with assist - Complete Level 5 (Walks with assist in room/hall) - Balance while stepping forward/back and can walk in room with assist - Complete    Row Name 09/06/22 2205           Does patient have an order for bedrest or is patient medically unstable No - Continue assessment       What is the highest level of mobility based on the progressive mobility assessment? Level 4 (Walks with assist in room) - Balance while marching in place and cannot step forward and back - Complete                Barriers to discharge:  Disposition Plan: SNF once medically stable Status is: Inpatient  Objective: Blood pressure (!) 133/92, pulse 82, temperature 97.6 F (36.4 C), resp. rate 18, height 5\' 8"  (1.727 m), weight 59.4 kg, SpO2 100 %.  Examination:  Physical Exam Constitutional:      Comments: Adult man appearing older than stated age lying in bed with mittens in place and sitter bedside and he is unable to follow commands and is unable to be redirected and appears still agitated and impulsive  HENT:     Head: Normocephalic and atraumatic.     Mouth/Throat:     Mouth: Mucous membranes are dry.  Eyes:     Extraocular Movements: Extraocular movements intact.     Pupils: Pupils are equal, round, and reactive to light.  Cardiovascular:     Rate and Rhythm: Normal  rate and regular rhythm.  Pulmonary:     Effort: Pulmonary effort is normal.     Breath sounds: Normal breath sounds.  Abdominal:     General: Bowel sounds are normal. There is no distension.     Palpations: Abdomen is soft.     Tenderness: There is no abdominal tenderness.  Musculoskeletal:        General: No swelling.     Cervical back: Normal range of motion and neck supple.  Skin:    General: Skin is warm and dry.  Neurological:     Comments: Moving all 4 extremities spontaneously and unable to follow any commands      Consultants:    Procedures:    Data Reviewed: Results for orders placed or performed during the hospital encounter of 09/05/22 (from the past 24 hour(s))  CBC with Differential/Platelet     Status: Abnormal   Collection Time: 09/09/22  5:34 AM  Result Value Ref Range   WBC 10.2 4.0 - 10.5 K/uL   RBC 6.37 (H) 4.22 -  5.81 MIL/uL   Hemoglobin 15.5 13.0 - 17.0 g/dL   HCT 16.0 73.7 - 10.6 %   MCV 80.2 80.0 - 100.0 fL   MCH 24.3 (L) 26.0 - 34.0 pg   MCHC 30.3 30.0 - 36.0 g/dL   RDW 26.9 (H) 48.5 - 46.2 %   Platelets 290 150 - 400 K/uL   nRBC 0.0 0.0 - 0.2 %   Neutrophils Relative % 73 %   Neutro Abs 7.5 1.7 - 7.7 K/uL   Lymphocytes Relative 18 %   Lymphs Abs 1.8 0.7 - 4.0 K/uL   Monocytes Relative 7 %   Monocytes Absolute 0.7 0.1 - 1.0 K/uL   Eosinophils Relative 1 %   Eosinophils Absolute 0.1 0.0 - 0.5 K/uL   Basophils Relative 1 %   Basophils Absolute 0.1 0.0 - 0.1 K/uL   Immature Granulocytes 0 %   Abs Immature Granulocytes 0.02 0.00 - 0.07 K/uL  Comprehensive metabolic panel     Status: Abnormal   Collection Time: 09/09/22  5:34 AM  Result Value Ref Range   Sodium 157 (H) 135 - 145 mmol/L   Potassium 3.9 3.5 - 5.1 mmol/L   Chloride 124 (H) 98 - 111 mmol/L   CO2 25 22 - 32 mmol/L   Glucose, Bld 80 70 - 99 mg/dL   BUN 20 8 - 23 mg/dL   Creatinine, Ser 7.03 0.61 - 1.24 mg/dL   Calcium 50.0 (H) 8.9 - 10.3 mg/dL   Total Protein 6.9 6.5 - 8.1  g/dL   Albumin 3.5 3.5 - 5.0 g/dL   AST 40 15 - 41 U/L   ALT 28 0 - 44 U/L   Alkaline Phosphatase 141 (H) 38 - 126 U/L   Total Bilirubin 0.7 0.3 - 1.2 mg/dL   GFR, Estimated >93 >81 mL/min   Anion gap 8 5 - 15  Magnesium     Status: None   Collection Time: 09/09/22  5:34 AM  Result Value Ref Range   Magnesium 2.2 1.7 - 2.4 mg/dL  Valproic acid level     Status: Abnormal   Collection Time: 09/09/22  5:34 AM  Result Value Ref Range   Valproic Acid Lvl <10 (L) 50.0 - 100.0 ug/mL  Basic metabolic panel     Status: Abnormal   Collection Time: 09/09/22  2:24 PM  Result Value Ref Range   Sodium 154 (H) 135 - 145 mmol/L   Potassium 4.5 3.5 - 5.1 mmol/L   Chloride 123 (H) 98 - 111 mmol/L   CO2 26 22 - 32 mmol/L   Glucose, Bld 121 (H) 70 - 99 mg/dL   BUN 23 8 - 23 mg/dL   Creatinine, Ser 8.29 (H) 0.61 - 1.24 mg/dL   Calcium 93.7 8.9 - 16.9 mg/dL   GFR, Estimated >67 >89 mL/min   Anion gap 5 5 - 15    I have Reviewed nursing notes, Vitals, and Lab results since pt's last encounter. Pertinent lab results : see above I have ordered labwork to follow up on.  I have reviewed the last note from staff over past 24 hours I have discussed pt's care plan and test results with nursing staff, CM/SW, and other staff as appropriate  Time spent: Greater than 50% of the 55 minute visit was spent in counseling/coordination of care for the patient as laid out in the A&P.   LOS: 3 days   Lewie Chamber, MD [facility] 09/09/2022, 4:59 PM

## 2022-09-09 NOTE — Progress Notes (Signed)
Patient slept on and off throughout this shift, he did get restless intermittently throughout the shift, prn ativan was given once, med was effective. Patient refused po med and oral intake. Sitter remains at bedside.

## 2022-09-09 NOTE — Progress Notes (Signed)
Pt has slept on and off this shift with periods of waking up, and becoming aggravated in bed. Pt did take medication orally with ice cream, has not had much of an appetite. EKG obtained to determine the status of telemetry. Poison control Raynelle Fanning) called to follow up on pt. and toxicologist recommends following an I&O's trend and recheck sodium level tomorrow.  MD informed.

## 2022-09-09 NOTE — Assessment & Plan Note (Addendum)
-   1/4 bottles noted with GPC from 12/20 but apparently nothing seen on media per micro. Has remained afebrile with minimal leukocytosis (considered reactive from other processes) - will repeat blood culture if becomes febrile

## 2022-09-10 DIAGNOSIS — R627 Adult failure to thrive: Secondary | ICD-10-CM

## 2022-09-10 DIAGNOSIS — Z7189 Other specified counseling: Secondary | ICD-10-CM | POA: Diagnosis not present

## 2022-09-10 DIAGNOSIS — G9341 Metabolic encephalopathy: Secondary | ICD-10-CM | POA: Diagnosis not present

## 2022-09-10 DIAGNOSIS — T56891A Toxic effect of other metals, accidental (unintentional), initial encounter: Secondary | ICD-10-CM | POA: Diagnosis not present

## 2022-09-10 DIAGNOSIS — E870 Hyperosmolality and hypernatremia: Secondary | ICD-10-CM | POA: Diagnosis not present

## 2022-09-10 LAB — COMPREHENSIVE METABOLIC PANEL
ALT: 26 U/L (ref 0–44)
AST: 33 U/L (ref 15–41)
Albumin: 3.5 g/dL (ref 3.5–5.0)
Alkaline Phosphatase: 120 U/L (ref 38–126)
Anion gap: 6 (ref 5–15)
BUN: 19 mg/dL (ref 8–23)
CO2: 26 mmol/L (ref 22–32)
Calcium: 9.9 mg/dL (ref 8.9–10.3)
Chloride: 122 mmol/L — ABNORMAL HIGH (ref 98–111)
Creatinine, Ser: 1.18 mg/dL (ref 0.61–1.24)
GFR, Estimated: >60 mL/min (ref >60)
Glucose, Bld: 112 mg/dL — ABNORMAL HIGH (ref 70–99)
Potassium: 4 mmol/L (ref 3.5–5.1)
Sodium: 154 mmol/L — ABNORMAL HIGH (ref 135–145)
Total Bilirubin: 0.4 mg/dL (ref 0.3–1.2)
Total Protein: 6.9 g/dL (ref 6.5–8.1)

## 2022-09-10 LAB — CBC WITH DIFFERENTIAL/PLATELET
Abs Immature Granulocytes: 0.02 10*3/uL (ref 0.00–0.07)
Basophils Absolute: 0.1 10*3/uL (ref 0.0–0.1)
Basophils Relative: 1 %
Eosinophils Absolute: 0.2 10*3/uL (ref 0.0–0.5)
Eosinophils Relative: 2 %
HCT: 47.1 % (ref 39.0–52.0)
Hemoglobin: 14.5 g/dL (ref 13.0–17.0)
Immature Granulocytes: 0 %
Lymphocytes Relative: 24 %
Lymphs Abs: 2.3 10*3/uL (ref 0.7–4.0)
MCH: 24.6 pg — ABNORMAL LOW (ref 26.0–34.0)
MCHC: 30.8 g/dL (ref 30.0–36.0)
MCV: 79.8 fL — ABNORMAL LOW (ref 80.0–100.0)
Monocytes Absolute: 0.8 10*3/uL (ref 0.1–1.0)
Monocytes Relative: 8 %
Neutro Abs: 6.3 10*3/uL (ref 1.7–7.7)
Neutrophils Relative %: 65 %
Platelets: 237 10*3/uL (ref 150–400)
RBC: 5.9 MIL/uL — ABNORMAL HIGH (ref 4.22–5.81)
RDW: 17.2 % — ABNORMAL HIGH (ref 11.5–15.5)
WBC: 9.6 10*3/uL (ref 4.0–10.5)
nRBC: 0 % (ref 0.0–0.2)

## 2022-09-10 LAB — CULTURE, BLOOD (ROUTINE X 2)
Culture: NO GROWTH
Special Requests: ADEQUATE

## 2022-09-10 LAB — MAGNESIUM: Magnesium: 2 mg/dL (ref 1.7–2.4)

## 2022-09-10 NOTE — Plan of Care (Signed)
  Problem: Ischemic Stroke/TIA Tissue Perfusion: Goal: Complications of ischemic stroke/TIA will be minimized Outcome: Progressing   Problem: Nutrition: Goal: Risk of aspiration will decrease Outcome: Progressing   Problem: Clinical Measurements: Goal: Ability to maintain clinical measurements within normal limits will improve Outcome: Progressing Goal: Will remain free from infection Outcome: Progressing Goal: Diagnostic test results will improve Outcome: Progressing Goal: Respiratory complications will improve Outcome: Progressing Goal: Cardiovascular complication will be avoided Outcome: Progressing   Problem: Activity: Goal: Risk for activity intolerance will decrease Outcome: Progressing   Problem: Coping: Goal: Level of anxiety will decrease Outcome: Progressing   Problem: Elimination: Goal: Will not experience complications related to bowel motility Outcome: Progressing Goal: Will not experience complications related to urinary retention Outcome: Progressing   Problem: Pain Managment: Goal: General experience of comfort will improve Outcome: Progressing   Problem: Safety: Goal: Ability to remain free from injury will improve Outcome: Progressing   Problem: Skin Integrity: Goal: Risk for impaired skin integrity will decrease Outcome: Progressing

## 2022-09-10 NOTE — Progress Notes (Signed)
Progress Note    [name]   YSH:[id number]  DOB: 1961-08-08  DOA: 09/05/2022     4 PCP: [facility]  Initial CC: AMS  Hospital Course: [name] is a 61 yo male with PMH dementia, bipolar 1 disorder, HLD, HTN, CAD, CVA who presented with confusion and dysphagia. With further workup he was also found to be agitated/impulsive, and unable to be redirected.  He required Haldol and Ativan.  His significant other was concerned about his inability to swallow and he was brought for further workup. Because of the difficulty eating/dysphagia, he was initially initiated on a stroke workup.  However, further workup also was notable for an elevated lithium level.  Given his presenting symptoms, it was felt that they were attributed to lithium toxicity.  Poison control was called and patient was started on fluids and admitted. Despite aggressive measures, he showed little improvement during hospitalization.  Given his poor physical reserve and declining functional status leading up to hospitalization, he appeared to be more consistent with failure to thrive.  Goals of care discussions were held with his significant other who is his main caretaker and decision maker.  He was made DNR and continued on medical treatment in the hopes for some improvement however his prognosis was made clear that it appears to be worsening daily.  Interval History:  Still no improvement since admission. Continues to remain impulsive and difficult to redirect.  Still requiring sitter. Long discussion held with Sallye Ober this morning to discuss goals of care.  We will pursue DNR but continue medical treatment at this time.  She understands his prognosis is poor and he continues to decline.  Assessment and Plan: * Lithium toxicity - Lithium level 1.51 on admission -Etiology possibly due to chronic toxicity versus possible extra doses unintentionally (wife/partner not certain) - Patient  presents with predominate neuro signs (agitation, confusion, dysphagia) supporting chronic toxicity; also cardiac effect with prolonged Qtc -Poison center aware - Continue fluids - lithium level normalized and dropped - Caution use of Haldol for his agitation.  Try to use Ativan more - appreciate tele-psych rec's; he has been taken off lithium and started on depakote; continue p.o. for now and if necessary, we can transition to IV - overall he had poor physical reserve coming into this and already poor QOL with decline as evidenced by severe cachexia; he may not have enough reserve to overcome this; have communicated this to Sallye Ober too; patient to be DNR for now, but continue treatment in the hopes that he improves  Goals of care, counseling/discussion - Long conversation held with [place] on 09/10/2022.  Patient has been hospitalized for days at that point and he has shown no signs of improvement.  He remains minimally interactive and impulsive, constantly trying to climb out of bed.  He has pulled IV out as well as telemetry off repetitively.  He cannot be monitored on telemetry due to this.  He also has had extremely minimal nutritional intake since admission.  He is not a good candidate for NG tube placement as he will inevitably pull this out as well and PEG tube placement also considered medically futile and inappropriate in context of his multiple comorbidities, poor quality of life, and progressive functional decline - Discussed with Sallye Ober that he does not appear to have the physical reserve to overcome the events that have led up to this hospitalization.  He may also not survive this hospitalization.  Lack of nutrition will cause  him to continue to rapidly decline which is also not unexpected Sallye Ober- [name] agrees with DNR status at this time. She does wish for ongoing attempts/treatment to see if he improves, but should he continue to decline (which I expect), then a natural death will be allowed. He  is currently not a rehab candidate unless were to show tremendous improvement and rather he appears more appropriate for inpatient vs residential hospice depending on course of events over the next few days  Hypernatremia - Na started to increase on NS and was changed to LR; still above goal - change IVF to D5W - trend BMP  Acute metabolic encephalopathy - Etiology attributed to probable lithium toxicity - CT head unremarkable for acute findings.  Chronic left frontal lobe infarct appreciated -MRI brain also negative for acute stroke -Patient is reported to have some altered mentation at baseline as well; per wife, he is nonverbal at baseline -Continue sitter - see GOC  Dysphagia - Suspected due to lithium toxicity - did have recent EGD June 2023, cannot see results, but per GI had gastritis and Candida esophagitis - unfortunately no signs of improvement still - see GOC - evaluated by SLP - continue offering diet as able; poor NGT and PEG candidate; not appropriate for TPN  Prolonged QT interval - Likely related to elevated lithium level - patient pulling off tele too much for meaning capture; okay to d/c - serial EKGs as he allows too - Hold QT prolonging agents when possible - Monitor and replace electrolytes as indicated  Hyperlipidemia - hold statin  Bipolar 1 disorder (HCC) - see lithium toxicity  - haldol / ativan PRN  Contamination of blood culture-resolved as of 09/09/2022 - 1/4 bottles noted with GPC from 12/20 but apparently nothing seen on media per micro. Has remained afebrile with minimal leukocytosis (considered reactive from other processes) - will repeat blood culture if becomes febrile  Old records reviewed in assessment of this patient  Antimicrobials:   DVT prophylaxis:  heparin injection 5,000 Units Start: 09/05/22 2200 SCD's Start: 09/05/22 2038   Code Status:   Code Status: DNR  Mobility Assessment (last 72 hours)     Mobility Assessment      Row Name 09/09/22 1000 09/08/22 1010 09/07/22 2133 09/07/22 1351     Does patient have an order for bedrest or is patient medically unstable No - Continue assessment No - Continue assessment No - Continue assessment --    What is the highest level of mobility based on the progressive mobility assessment? Level 5 (Walks with assist in room/hall) - Balance while stepping forward/back and can walk in room with assist - Complete Level 5 (Walks with assist in room/hall) - Balance while stepping forward/back and can walk in room with assist - Complete Level 5 (Walks with assist in room/hall) - Balance while stepping forward/back and can walk in room with assist - Complete Level 5 (Walks with assist in room/hall) - Balance while stepping forward/back and can walk in room with assist - Complete             Barriers to discharge:  Disposition Plan: SNF once medically stable Status is: Inpatient  Objective: Blood pressure 135/86, pulse 96, temperature 98.5 F (36.9 C), resp. rate 19, height 5\' 8"  (1.727 m), weight 59.4 kg, SpO2 100 %.  Examination:  Physical Exam Constitutional:      Comments: Adult man appearing older than stated age lying in bed with mittens in place and sitter bedside and he  is unable to follow commands and is unable to be redirected and appears still agitated and impulsive. He is cachectic   HENT:     Head: Normocephalic and atraumatic.     Mouth/Throat:     Mouth: Mucous membranes are dry.  Eyes:     Extraocular Movements: Extraocular movements intact.     Pupils: Pupils are equal, round, and reactive to light.  Cardiovascular:     Rate and Rhythm: Normal rate and regular rhythm.  Pulmonary:     Effort: Pulmonary effort is normal.     Breath sounds: Normal breath sounds.  Abdominal:     General: Bowel sounds are normal. There is no distension.     Palpations: Abdomen is soft.     Tenderness: There is no abdominal tenderness.  Musculoskeletal:        General: No  swelling.     Cervical back: Normal range of motion and neck supple.  Skin:    General: Skin is warm and dry.  Neurological:     Comments: Moving all 4 extremities spontaneously and unable to follow any commands      Consultants:    Procedures:    Data Reviewed: Results for orders placed or performed during the hospital encounter of 09/05/22 (from the past 24 hour(s))  Basic metabolic panel     Status: Abnormal   Collection Time: 09/09/22  2:24 PM  Result Value Ref Range   Sodium 154 (H) 135 - 145 mmol/L   Potassium 4.5 3.5 - 5.1 mmol/L   Chloride 123 (H) 98 - 111 mmol/L   CO2 26 22 - 32 mmol/L   Glucose, Bld 121 (H) 70 - 99 mg/dL   BUN 23 8 - 23 mg/dL   Creatinine, Ser 3.79 (H) 0.61 - 1.24 mg/dL   Calcium 02.4 8.9 - 09.7 mg/dL   GFR, Estimated >35 >32 mL/min   Anion gap 5 5 - 15  CBC with Differential/Platelet     Status: Abnormal   Collection Time: 09/10/22  5:10 AM  Result Value Ref Range   WBC 9.6 4.0 - 10.5 K/uL   RBC 5.90 (H) 4.22 - 5.81 MIL/uL   Hemoglobin 14.5 13.0 - 17.0 g/dL   HCT 99.2 42.6 - 83.4 %   MCV 79.8 (L) 80.0 - 100.0 fL   MCH 24.6 (L) 26.0 - 34.0 pg   MCHC 30.8 30.0 - 36.0 g/dL   RDW 19.6 (H) 22.2 - 97.9 %   Platelets 237 150 - 400 K/uL   nRBC 0.0 0.0 - 0.2 %   Neutrophils Relative % 65 %   Neutro Abs 6.3 1.7 - 7.7 K/uL   Lymphocytes Relative 24 %   Lymphs Abs 2.3 0.7 - 4.0 K/uL   Monocytes Relative 8 %   Monocytes Absolute 0.8 0.1 - 1.0 K/uL   Eosinophils Relative 2 %   Eosinophils Absolute 0.2 0.0 - 0.5 K/uL   Basophils Relative 1 %   Basophils Absolute 0.1 0.0 - 0.1 K/uL   Immature Granulocytes 0 %   Abs Immature Granulocytes 0.02 0.00 - 0.07 K/uL  Comprehensive metabolic panel     Status: Abnormal   Collection Time: 09/10/22  5:10 AM  Result Value Ref Range   Sodium 154 (H) 135 - 145 mmol/L   Potassium 4.0 3.5 - 5.1 mmol/L   Chloride 122 (H) 98 - 111 mmol/L   CO2 26 22 - 32 mmol/L   Glucose, Bld 112 (H) 70 - 99 mg/dL   BUN  19 8 -  23 mg/dL   Creatinine, Ser 2.02 0.61 - 1.24 mg/dL   Calcium 9.9 8.9 - 33.4 mg/dL   Total Protein 6.9 6.5 - 8.1 g/dL   Albumin 3.5 3.5 - 5.0 g/dL   AST 33 15 - 41 U/L   ALT 26 0 - 44 U/L   Alkaline Phosphatase 120 38 - 126 U/L   Total Bilirubin 0.4 0.3 - 1.2 mg/dL   GFR, Estimated >35 >68 mL/min   Anion gap 6 5 - 15  Magnesium     Status: None   Collection Time: 09/10/22  5:10 AM  Result Value Ref Range   Magnesium 2.0 1.7 - 2.4 mg/dL    I have Reviewed nursing notes, Vitals, and Lab results since pt's last encounter. Pertinent lab results : see above I have ordered labwork to follow up on.  I have reviewed the last note from staff over past 24 hours I have discussed pt's care plan and test results with nursing staff, CM/SW, and other staff as appropriate  Time spent: Greater than 50% of the 55 minute visit was spent in counseling/coordination of care for the patient as laid out in the A&P.   LOS: 4 days   Lewie Chamber, MD [facility] 09/10/2022, 12:15 PM

## 2022-09-10 NOTE — Assessment & Plan Note (Signed)
-   Long conversation held with [place] on 09/10/2022.  Patient has been hospitalized for days at that point and he has shown no signs of improvement.  He remains minimally interactive and impulsive, constantly trying to climb out of bed.  He has pulled IV out as well as telemetry off repetitively.  He cannot be monitored on telemetry due to this.  He also has had extremely minimal nutritional intake since admission.  He is not a good candidate for NG tube placement as he will inevitably pull this out as well and PEG tube placement also considered medically futile and inappropriate in context of his multiple comorbidities, poor quality of life, and progressive functional decline - Discussed with Sallye Ober that he does not appear to have the physical reserve to overcome the events that have led up to this hospitalization.  He may also not survive this hospitalization.  Lack of nutrition will cause him to continue to rapidly decline which is also not unexpected Sallye Ober agrees with DNR status at this time. She does wish for ongoing attempts/treatment to see if he improves, but should he continue to decline (which I expect), then a natural death will be allowed. He is currently not a rehab candidate unless were to show tremendous improvement and rather he appears more appropriate for inpatient vs residential hospice depending on course of events over the next few days

## 2022-09-11 DIAGNOSIS — T56891A Toxic effect of other metals, accidental (unintentional), initial encounter: Secondary | ICD-10-CM | POA: Diagnosis not present

## 2022-09-11 DIAGNOSIS — Z7189 Other specified counseling: Secondary | ICD-10-CM | POA: Diagnosis not present

## 2022-09-11 DIAGNOSIS — R1310 Dysphagia, unspecified: Secondary | ICD-10-CM | POA: Diagnosis not present

## 2022-09-11 DIAGNOSIS — G9341 Metabolic encephalopathy: Secondary | ICD-10-CM | POA: Diagnosis not present

## 2022-09-11 LAB — COMPREHENSIVE METABOLIC PANEL
ALT: 27 U/L (ref 0–44)
AST: 34 U/L (ref 15–41)
Albumin: 3 g/dL — ABNORMAL LOW (ref 3.5–5.0)
Alkaline Phosphatase: 98 U/L (ref 38–126)
Anion gap: 4 — ABNORMAL LOW (ref 5–15)
BUN: 14 mg/dL (ref 8–23)
CO2: 28 mmol/L (ref 22–32)
Calcium: 9.2 mg/dL (ref 8.9–10.3)
Chloride: 117 mmol/L — ABNORMAL HIGH (ref 98–111)
Creatinine, Ser: 0.95 mg/dL (ref 0.61–1.24)
GFR, Estimated: >60 mL/min (ref >60)
Glucose, Bld: 93 mg/dL (ref 70–99)
Potassium: 3.5 mmol/L (ref 3.5–5.1)
Sodium: 149 mmol/L — ABNORMAL HIGH (ref 135–145)
Total Bilirubin: 0.6 mg/dL (ref 0.3–1.2)
Total Protein: 6.3 g/dL — ABNORMAL LOW (ref 6.5–8.1)

## 2022-09-11 LAB — CBC WITH DIFFERENTIAL/PLATELET
Abs Immature Granulocytes: 0.02 10*3/uL (ref 0.00–0.07)
Basophils Absolute: 0.1 10*3/uL (ref 0.0–0.1)
Basophils Relative: 1 %
Eosinophils Absolute: 0.1 10*3/uL (ref 0.0–0.5)
Eosinophils Relative: 2 %
HCT: 44.2 % (ref 39.0–52.0)
Hemoglobin: 13.2 g/dL (ref 13.0–17.0)
Immature Granulocytes: 0 %
Lymphocytes Relative: 18 %
Lymphs Abs: 1.3 10*3/uL (ref 0.7–4.0)
MCH: 24 pg — ABNORMAL LOW (ref 26.0–34.0)
MCHC: 29.9 g/dL — ABNORMAL LOW (ref 30.0–36.0)
MCV: 80.4 fL (ref 80.0–100.0)
Monocytes Absolute: 0.6 10*3/uL (ref 0.1–1.0)
Monocytes Relative: 8 %
Neutro Abs: 5.2 10*3/uL (ref 1.7–7.7)
Neutrophils Relative %: 71 %
Platelets: 193 10*3/uL (ref 150–400)
RBC: 5.5 MIL/uL (ref 4.22–5.81)
RDW: 16.7 % — ABNORMAL HIGH (ref 11.5–15.5)
WBC: 7.3 10*3/uL (ref 4.0–10.5)
nRBC: 0 % (ref 0.0–0.2)

## 2022-09-11 LAB — MAGNESIUM: Magnesium: 1.8 mg/dL (ref 1.7–2.4)

## 2022-09-11 LAB — T3, FREE: T3, Free: 2.1 pg/mL (ref 2.0–4.4)

## 2022-09-11 MED ORDER — VALPROATE SODIUM 100 MG/ML IV SOLN
250.0000 mg | Freq: Two times a day (BID) | INTRAVENOUS | Status: DC
  Administered 2022-09-11 – 2022-09-21 (×21): 250 mg via INTRAVENOUS
  Filled 2022-09-11 (×23): qty 2.5

## 2022-09-11 NOTE — Progress Notes (Signed)
SLP Cancellation Note  Patient Details Name: [name] MRN: [id number] DOB: Jun 23, 1961   Cancelled treatment:       Reason Eval/Treat Not Completed: Fatigue/lethargy limiting ability to participate (RN reports that Pt not alert for PO)  Thank you,  Havery Moros, CCC-SLP [phone]   [name] 09/11/2022, 6:36 PM

## 2022-09-11 NOTE — Progress Notes (Signed)
Progress Note    [name]   ZOX:[id number]  DOB: 07-19-1961  DOA: 09/05/2022     5 PCP: [facility]  Initial CC: AMS  Hospital Course: [name] is a 61 yo male with PMH dementia, bipolar 1 disorder, HLD, HTN, CAD, CVA who presented with confusion and dysphagia. With further workup he was also found to be agitated/impulsive, and unable to be redirected.  He required Haldol and Ativan.  His significant other was concerned about his inability to swallow and he was brought for further workup. Because of the difficulty eating/dysphagia, he was initially initiated on a stroke workup.  However, further workup also was notable for an elevated lithium level.  Given his presenting symptoms, it was felt that they were attributed to lithium toxicity.  Poison control was called and patient was started on fluids and admitted. Despite aggressive measures, he showed little improvement during hospitalization.  Given his poor physical reserve and declining functional status leading up to hospitalization, he appeared to be more consistent with failure to thrive.  Goals of care discussions were held with his significant other who is his main caretaker and decision maker.  He was made DNR and continued on medical treatment in the hopes for some improvement however his prognosis was made clear that it appears to be worsening daily.  Interval History:  Still not eating any. Needed a new IV again this morning. Still not showing much improvement if any. Continues to need a sitter.   Assessment and Plan: * Lithium toxicity - Lithium level 1.51 on admission -Etiology possibly due to chronic toxicity versus possible extra doses unintentionally (wife/partner not certain) - Patient presents with predominate neuro signs (agitation, confusion, dysphagia) supporting chronic toxicity; also cardiac effect with prolonged Qtc -Poison center aware - Continue fluids - lithium level  normalized and dropped - Caution use of Haldol for his agitation.  Try to use Ativan more - appreciate tele-psych rec's; he has been taken off lithium and started on depakote; continue p.o. for now and if necessary, we can transition to IV - overall he had poor physical reserve coming into this and already poor QOL with decline as evidenced by severe cachexia; he may not have enough reserve to overcome this; have communicated this to [name] too; patient to be DNR for now, but continue treatment in the hopes that he improves  Goals of care, counseling/discussion - Long conversation held with [name] on 09/10/2022.  Patient has been hospitalized for days at that point and he has shown no signs of improvement.  He remains minimally interactive and impulsive, constantly trying to climb out of bed.  He has pulled IV out as well as telemetry off repetitively.  He cannot be monitored on telemetry due to this.  He also has had extremely minimal nutritional intake since admission.  He is not a good candidate for NG tube placement as he will inevitably pull this out as well and PEG tube placement also considered medically futile and inappropriate in context of his multiple comorbidities, poor quality of life, and progressive functional decline - Discussed with [name] that he does not appear to have the physical reserve to overcome the events that have led up to this hospitalization.  He may also not survive this hospitalization.  Lack of nutrition will cause him to continue to rapidly decline which is also not unexpected Sallye Ober- [name] agrees with DNR status at this time. She does wish for ongoing attempts/treatment to see if  he improves, but should he continue to decline (which I expect), then a natural death will be allowed. He is currently not a rehab candidate unless were to show tremendous improvement and rather he appears more appropriate for inpatient vs residential hospice depending on course of events over the next  few days  Hypernatremia - Na started to increase on NS and was changed to LR; still above goal - change IVF to D5W - trend BMP  Acute metabolic encephalopathy - Etiology attributed to probable lithium toxicity - CT head unremarkable for acute findings.  Chronic left frontal lobe infarct appreciated -MRI brain also negative for acute stroke -Patient is reported to have some altered mentation at baseline as well; per wife, he is nonverbal at baseline -Continue sitter - see GOC  Dysphagia - Suspected due to lithium toxicity - did have recent EGD June 2023, cannot see results, but per GI had gastritis and Candida esophagitis - unfortunately no signs of improvement still - see GOC - evaluated by SLP - continue offering diet as able; poor NGT and PEG candidate; not appropriate for TPN  Prolonged QT interval - Likely related to elevated lithium level - patient pulling off tele too much for meaning capture; okay to d/c - serial EKGs as he allows too - Hold QT prolonging agents when possible  Hyperlipidemia - hold statin  Bipolar 1 disorder (HCC) - see lithium toxicity  - haldol / ativan PRN  Contamination of blood culture-resolved as of 09/09/2022 - 1/4 bottles noted with GPC from 12/20 but apparently nothing seen on media per micro. Has remained afebrile with minimal leukocytosis (considered reactive from other processes) - will repeat blood culture if becomes febrile  Old records reviewed in assessment of this patient  Antimicrobials:   DVT prophylaxis:  heparin injection 5,000 Units Start: 09/05/22 2200 SCD's Start: 09/05/22 2038   Code Status:   Code Status: DNR  Mobility Assessment (last 72 hours)     Mobility Assessment     Row Name 09/11/22 1100 09/11/22 0800 09/10/22 2000 09/09/22 1000     Does patient have an order for bedrest or is patient medically unstable No - Continue assessment No - Continue assessment No - Continue assessment No - Continue assessment     What is the highest level of mobility based on the progressive mobility assessment? Level 1 (Bedfast) - Unable to balance while sitting on edge of bed -- Level 5 (Walks with assist in room/hall) - Balance while stepping forward/back and can walk in room with assist - Complete Level 5 (Walks with assist in room/hall) - Balance while stepping forward/back and can walk in room with assist - Complete             Barriers to discharge:  Disposition Plan: may  need hospice house vs home with hospice Status is: Inpatient  Objective: Blood pressure 127/63, pulse (!) 43, temperature 97.6 F (36.4 C), temperature source Axillary, resp. rate 16, height 5\' 8"  (1.727 m), weight 59.4 kg, SpO2 100 %.  Examination:  Physical Exam Constitutional:      Comments: Adult man appearing older than stated age lying in bed with mittens in place and sitter bedside and he is unable to follow commands and is unable to be redirected and appears still agitated and impulsive. He is cachectic   HENT:     Head: Normocephalic and atraumatic.     Mouth/Throat:     Mouth: Mucous membranes are dry.  Eyes:     Extraocular  Movements: Extraocular movements intact.     Pupils: Pupils are equal, round, and reactive to light.  Cardiovascular:     Rate and Rhythm: Normal rate and regular rhythm.  Pulmonary:     Effort: Pulmonary effort is normal.     Breath sounds: Normal breath sounds.  Abdominal:     General: Bowel sounds are normal. There is no distension.     Palpations: Abdomen is soft.     Tenderness: There is no abdominal tenderness.  Musculoskeletal:        General: No swelling.     Cervical back: Normal range of motion and neck supple.  Skin:    General: Skin is warm and dry.  Neurological:     Comments: Moving all 4 extremities spontaneously; does some things with intention, others without. Today could follow a couple commands like raising arms       Consultants:    Procedures:    Data  Reviewed: Results for orders placed or performed during the hospital encounter of 09/05/22 (from the past 24 hour(s))  CBC with Differential/Platelet     Status: Abnormal   Collection Time: 09/11/22  4:55 AM  Result Value Ref Range   WBC 7.3 4.0 - 10.5 K/uL   RBC 5.50 4.22 - 5.81 MIL/uL   Hemoglobin 13.2 13.0 - 17.0 g/dL   HCT 09.9 83.3 - 82.5 %   MCV 80.4 80.0 - 100.0 fL   MCH 24.0 (L) 26.0 - 34.0 pg   MCHC 29.9 (L) 30.0 - 36.0 g/dL   RDW 05.3 (H) 97.6 - 73.4 %   Platelets 193 150 - 400 K/uL   nRBC 0.0 0.0 - 0.2 %   Neutrophils Relative % 71 %   Neutro Abs 5.2 1.7 - 7.7 K/uL   Lymphocytes Relative 18 %   Lymphs Abs 1.3 0.7 - 4.0 K/uL   Monocytes Relative 8 %   Monocytes Absolute 0.6 0.1 - 1.0 K/uL   Eosinophils Relative 2 %   Eosinophils Absolute 0.1 0.0 - 0.5 K/uL   Basophils Relative 1 %   Basophils Absolute 0.1 0.0 - 0.1 K/uL   Immature Granulocytes 0 %   Abs Immature Granulocytes 0.02 0.00 - 0.07 K/uL  Comprehensive metabolic panel     Status: Abnormal   Collection Time: 09/11/22  4:55 AM  Result Value Ref Range   Sodium 149 (H) 135 - 145 mmol/L   Potassium 3.5 3.5 - 5.1 mmol/L   Chloride 117 (H) 98 - 111 mmol/L   CO2 28 22 - 32 mmol/L   Glucose, Bld 93 70 - 99 mg/dL   BUN 14 8 - 23 mg/dL   Creatinine, Ser 1.93 0.61 - 1.24 mg/dL   Calcium 9.2 8.9 - 79.0 mg/dL   Total Protein 6.3 (L) 6.5 - 8.1 g/dL   Albumin 3.0 (L) 3.5 - 5.0 g/dL   AST 34 15 - 41 U/L   ALT 27 0 - 44 U/L   Alkaline Phosphatase 98 38 - 126 U/L   Total Bilirubin 0.6 0.3 - 1.2 mg/dL   GFR, Estimated >24 >09 mL/min   Anion gap 4 (L) 5 - 15  Magnesium     Status: None   Collection Time: 09/11/22  4:55 AM  Result Value Ref Range   Magnesium 1.8 1.7 - 2.4 mg/dL    I have Reviewed nursing notes, Vitals, and Lab results since pt's last encounter. Pertinent lab results : see above I have ordered labwork to follow up on.  I have reviewed the last note from staff over past 24 hours I have discussed pt's  care plan and test results with nursing staff, CM/SW, and other staff as appropriate  Time spent: Greater than 50% of the 55 minute visit was spent in counseling/coordination of care for the patient as laid out in the A&P.   LOS: 5 days   Lewie Chamber, MD [facility] 09/11/2022, 2:02 PM

## 2022-09-11 NOTE — Plan of Care (Signed)
  Problem: Education: Goal: Knowledge of General Education information will improve Description Including pain rating scale, medication(s)/side effects and non-pharmacologic comfort measures Outcome: Progressing   

## 2022-09-11 NOTE — Progress Notes (Addendum)
Pt's pulse is 45. All other VSS. Pt's pulse has trended low throughout day. Dr. Dorthula Perfect - Delrae Rend informed. Wardell Heath Gerrianne Scale

## 2022-09-11 NOTE — Progress Notes (Signed)
OT Cancellation Note  Patient Details Name: [name] MRN: [id number] DOB: 02/06/1961   Cancelled Treatment:    Reason Eval/Treat Not Completed: Medical issues which prohibited therapy. Review of pt's chart indicates pt is not medically appropriate for rehab at this time. Will monitor pt's chart and treat if status improves.   [name] OT, MOT   [name] 09/11/2022, 2:23 PM

## 2022-09-12 DIAGNOSIS — E870 Hyperosmolality and hypernatremia: Secondary | ICD-10-CM | POA: Diagnosis not present

## 2022-09-12 DIAGNOSIS — T56891A Toxic effect of other metals, accidental (unintentional), initial encounter: Secondary | ICD-10-CM | POA: Diagnosis not present

## 2022-09-12 DIAGNOSIS — Z7189 Other specified counseling: Secondary | ICD-10-CM | POA: Diagnosis not present

## 2022-09-12 LAB — CULTURE, BLOOD (ROUTINE X 2)

## 2022-09-12 NOTE — Progress Notes (Signed)
Progress Note    [name]   AOZ:[id number]  DOB: August 13, 1961  DOA: 09/05/2022     6 PCP: [facility]  Initial CC: AMS  Hospital Course: Mr. Knoop is a 61 yo male with PMH dementia, bipolar 1 disorder, HLD, HTN, CAD, CVA who presented with confusion and dysphagia. With further workup he was also found to be agitated/impulsive, and unable to be redirected.  He required Haldol and Ativan.  His significant other was concerned about his inability to swallow and he was brought for further workup. Because of the difficulty eating/dysphagia, he was initially initiated on a stroke workup.  However, further workup also was notable for an elevated lithium level.  Given his presenting symptoms, it was felt that they were attributed to lithium toxicity.  Poison control was called and patient was started on fluids and admitted. Despite aggressive measures, he showed little improvement during hospitalization.  Given his poor physical reserve and declining functional status leading up to hospitalization, he appeared to be more consistent with failure to thrive.  Goals of care discussions were held with his significant other who is his main caretaker and decision maker.  He was made DNR and continued on medical treatment in the hopes for some improvement however his prognosis was made clear that it appears to be worsening daily.  Interval History:  Sitter at bedside,  No significant oral intake-  Assessment and Plan: 1)Lithium Toxicity - Lithium level 1.51 on admission -Etiology possibly due to chronic toxicity versus possible extra doses unintentionally (wife/partner not certain) - Patient presents with predominate neuro signs (agitation, confusion, dysphagia) supporting chronic toxicity; also cardiac effect with prolonged Qtc -Poison center aware - Continue iv fluids until significant other can make further decisions  - lithium level normalized and dropped -  Caution use of Haldol for his agitation.  Try to use Ativan more - appreciate tele-psych rec's; he has been taken off lithium and started on depakote; continue p.o. for now and if necessary, we can transition to IV - overall he had poor physical reserve coming into this and already poor QOL with decline as evidenced by severe cachexia; he may not have enough reserve to overcome this; have communicated this to Sallye Ober too; patient to be DNR for now, but continue treatment in the hopes that he improves  Goals of care, counseling/discussion - Long conversation held with [place] on 09/10/2022.  Patient has been hospitalized for days at that point and he has shown no signs of improvement.  He remains minimally interactive and impulsive, constantly trying to climb out of bed.  He has pulled IV out as well as telemetry off repetitively.  He cannot be monitored on telemetry due to this.  He also has had extremely minimal nutritional intake since admission.  He is not a good candidate for NG tube placement as he will inevitably pull this out as well and PEG tube placement also considered medically futile and inappropriate in context of his multiple comorbidities, poor quality of life, and progressive functional decline - Discussed with Sallye Ober that he does not appear to have the physical reserve to overcome the events that have led up to this hospitalization.  He may also not survive this hospitalization.  Lack of nutrition will cause him to continue to rapidly decline which is also not unexpected Sallye Ober agrees with DNR status at this time. She does wish for ongoing attempts/treatment to see if he improves, but should he continue to decline (  which I expect), then a natural death will be allowed. He is currently not a rehab candidate unless were to show tremendous improvement and rather he appears more appropriate for inpatient vs residential hospice depending on course of events over the next few  days  Hypernatremia - Na started to increase on NS and was changed to LR; still above goal -c/n IVF to D5W - trend BMP  Acute metabolic encephalopathy - Etiology attributed to probable lithium toxicity - CT head unremarkable for acute findings.  Chronic left frontal lobe infarct appreciated -MRI brain also negative for acute stroke -Patient is reported to have some altered mentation at baseline as well; per wife, he is nonverbal at baseline -Continue sitter - see GOC  Dysphagia - Suspected due to lithium toxicity - did have recent EGD June 2023, cannot see results, but per GI had gastritis and Candida esophagitis - unfortunately no signs of improvement still - see GOC - evaluated by SLP - continue offering diet as able; poor NGT and PEG candidate; not appropriate for TPN  Prolonged QT interval - Likely related to elevated lithium level - patient pulling off tele too much for meaning capture; okay to d/c - serial EKGs as he allows too - Hold QT prolonging agents when possible  Hyperlipidemia - hold statin  Bipolar 1 disorder (HCC) - see lithium toxicity  - haldol / ativan PRN  Contamination of blood culture-resolved as of 09/09/2022 - 1/4 bottles noted with GPC from 12/20 but apparently nothing seen on media per micro. Has remained afebrile with minimal leukocytosis (considered reactive from other processes) - will repeat blood culture if becomes febrile   Antimicrobials:   DVT prophylaxis:  heparin injection 5,000 Units Start: 09/05/22 2200 SCD's Start: 09/05/22 2038   Code Status:   Code Status: DNR  Mobility Assessment (last 72 hours)     Mobility Assessment     Row Name 09/11/22 1100 09/11/22 0800 09/10/22 2000       Does patient have an order for bedrest or is patient medically unstable No - Continue assessment No - Continue assessment No - Continue assessment     What is the highest level of mobility based on the progressive mobility assessment? Level 1  (Bedfast) - Unable to balance while sitting on edge of bed -- Level 5 (Walks with assist in room/hall) - Balance while stepping forward/back and can walk in room with assist - Complete              Barriers to discharge:  Disposition Plan: may  need hospice house vs home with hospice --Family contact---called and updated Deneise Lever -[phone], she will try and come in on 09/13/2022 to have further goals of care conversations -As per Jonn Shingles does not have any living family members. His last living relative passed 3 months ago and he has no children   Status is: Inpatient  Objective: Blood pressure 104/66, pulse (!) 52, temperature 97.7 F (36.5 C), temperature source Axillary, resp. rate 18, height 5\' 8"  (1.727 m), weight 59.4 kg, SpO2 100 %.   Physical Exam Gen:-Frail and cachectic appearing, alternate between agitation and lethargy after medication HEENT:- [place].AT, No sclera icterus Neck-Supple Neck,No JVD,.  Lungs-  CTAB , fair air movement bilaterally  CV- S1, S2 normal, RRR Abd-  +ve B.Sounds, Abd Soft, No tenderness,    Extremity/Skin:- No  edema,   good pedal pulses  NeuroPsych--alternate between agitation and lethargy after medication  Consultants:  -Palliative care -Chaplain  LOS: 6 days   Shon Hale, MD [facility] 09/12/2022, 5:49 PM

## 2022-09-12 NOTE — Progress Notes (Signed)
Pt had a restless night despite PRN Ativan, He was getting up on all fours and was difficult to redirect. PRN Haldol was given and then patient rested. No other acute events over night. [name]

## 2022-09-12 NOTE — Progress Notes (Signed)
SLP Cancellation Note  Patient Details Name: [name] MRN: [id number] DOB: 30-Nov-1960   Cancelled treatment:       Reason Eval/Treat Not Completed: Fatigue/lethargy limiting ability to participate; SLP spoke with RN who reported that Pt had ativan due to agitation and is now sedated. SLP will sign off due to inability to participate since last week. Reconsult if Pt becomes appropriate.  Thank you,  Havery Moros, CCC-SLP [phone]                                               Elihu Milstein 09/12/2022, 3:48 PM

## 2022-09-12 NOTE — Progress Notes (Signed)
I just spoke with his significant other, [name]. [name] does not have any living family members. His last living relative passed 3 months ago and he has no children. Therefore, [name] is the only person connected and present for [name]. She stated that they were given HCPOA paperwork but never completed it. However, they did talk about what [name] would have wanted at end of life and he shared with her that he WOULD NOT want life prolonging measures. [name] also stated that [name] shared with her that "if its my time to go, let me go." She is accepting of this and knows that he is very sick. Chaplain provided support over the phone today and will remain available in order to provide spiritual support and to assess for spiritual need.   Rev. Marvion Bastidas [employer]

## 2022-09-12 NOTE — Progress Notes (Addendum)
Pt up on all fours and putting head on head board. Pt difficult to redirect. PRN Haldol given. [name]

## 2022-09-12 NOTE — ACP (Advance Care Planning) (Signed)
I just spoke with his significant other, [name]. [name] not have any living family members. His last living relative passed 3 months ago and he has no children. Therefore, [name] is the only person connected and present for [name]. She stated that they were given HCPOA paperwork but never completed it. However, they did talk about what [name] would have wanted at end of life and he shared with her that he WOULD NOT want life prolonging measures. [name] also stated that [name] shared with her that "if its my time to go, let me go." She is accepting of this and knows that he is very sick.

## 2022-09-12 NOTE — Progress Notes (Signed)
Pt restless and getting on a fours. Pt was redirected several times.  He ha episode of urinary incontinence. Pericare performed and clean dry linens placed on bed.He is able to speak his name very slowly to this RN. Mouth care performed (swabbed and moisturizer applied). He tolerated well. Sips of sprite were given cautiously and patient tolerated well.  Due to increased restlessness and pulling at IV line, this RN administered PRN Ativan. Sitter remains at bedside. Wardell Heath Gerrianne Scale

## 2022-09-13 DIAGNOSIS — T56891D Toxic effect of other metals, accidental (unintentional), subsequent encounter: Secondary | ICD-10-CM

## 2022-09-13 DIAGNOSIS — G9341 Metabolic encephalopathy: Secondary | ICD-10-CM | POA: Diagnosis not present

## 2022-09-13 DIAGNOSIS — Z515 Encounter for palliative care: Secondary | ICD-10-CM

## 2022-09-13 NOTE — Progress Notes (Signed)
Ativan x 3 this shift for agitation with good relief of symptoms.  Ativan lasting almost 4 hours, pt becomes very agitated in between, trying to get OOB, pulling at lines, drains.  Sitter remains at bedside for pt safety.  Pt follows simple commands like squeezing hands and taking sips.   Denies pain.

## 2022-09-13 NOTE — Progress Notes (Signed)
Progress Note    [name]   KZL:[id number]  DOB: [birth year]  DOA: 09/05/2022     7 PCP: [facility]  Initial CC: AMS  Hospital Course: [name] is a 61 yo male with PMH dementia, bipolar 1 disorder, HLD, HTN, CAD, CVA who presented with confusion and dysphagia. With further workup he was also found to be agitated/impulsive, and unable to be redirected.  He required Haldol and Ativan.  His significant other was concerned about his inability to swallow and he was brought for further workup. Because of the difficulty eating/dysphagia, he was initially initiated on a stroke workup.  However, further workup also was notable for an elevated lithium level.  Given his presenting symptoms, it was felt that they were attributed to lithium toxicity.  Poison control was called and patient was started on fluids and admitted. Despite aggressive measures, he showed little improvement during hospitalization.  Given his poor physical reserve and declining functional status leading up to hospitalization, he appeared to be more consistent with failure to thrive.  Goals of care discussions were held with his significant other who is his main caretaker and decision maker.  He was made DNR and continued on medical treatment in the hopes for some improvement however his prognosis was made clear that it appears to be worsening daily.  Interval History:  Sitter at bedside,  No significant oral intake- Family contact---I met with  [name] [phone],  on 09/13/2022 to have further goals of care conversations -As per [name] does not have any living family members. His last living relative passed 3 months ago and he has no children  -She request that will continue IV dextrose solution for now until she can make a final decision regarding possible transition to comfort care  Assessment and Plan: 1)Lithium Toxicity - Lithium level 1.51 on admission -Etiology  possibly due to chronic toxicity versus possible extra doses unintentionally (wife/partner not certain) - Patient presents with predominate neuro signs (agitation, confusion, dysphagia) supporting chronic toxicity; also cardiac effect with prolonged Qtc -Poison [name] aware - Continue iv fluids until significant other can make further decisions  - lithium level normalized and dropped - Caution use of Haldol for his agitation.  Try to use Ativan more - appreciate tele-psych rec's; he has been taken off lithium and started on depakote; continue p.o. for now and if necessary, we can transition to IV - overall he had poor physical reserve coming into this and already poor QOL with decline as evidenced by severe cachexia; he may not have enough reserve to overcome this; have communicated this to [name] too; patient to be DNR for now, but continue treatment in the hopes that he improves  Goals of care, counseling/discussion - Long conversation held with [name] on 09/10/2022.  Patient has been hospitalized for days at that point and he has shown no signs of improvement.  He remains minimally interactive and impulsive, constantly trying to climb out of bed.  He has pulled IV out as well as telemetry off repetitively.  He cannot be monitored on telemetry due to this.  He also has had extremely minimal nutritional intake since admission.  He is not a good candidate for NG tube placement as he will inevitably pull this out as well and PEG tube placement also considered medically futile and inappropriate in context of his multiple comorbidities, poor quality of life, and progressive functional decline - Discussed with [name] that he does not appear to  have the physical reserve to overcome the events that have led up to this hospitalization.  He may also not survive this hospitalization.  Lack of nutrition will cause him to continue to rapidly decline which is also not unexpected [name] agrees with DNR status at  this time. She does wish for ongoing attempts/treatment to see if he improves, but should he continue to decline (which I expect), then a natural death will be allowed. He is currently not a rehab candidate unless were to show tremendous improvement and rather he appears more appropriate for inpatient vs residential hospice depending on course of events over the next few days  Hypernatremia - Na started to increase on NS and was changed to LR; still above goal -c/n IVF to D5W - trend BMP  Acute metabolic encephalopathy - Etiology attributed to probable lithium toxicity - CT head unremarkable for acute findings.  Chronic left frontal lobe infarct appreciated -MRI brain also negative for acute stroke -Patient is reported to have some altered mentation at baseline as well; per wife, he is nonverbal at baseline -Continue sitter - see GOC  Dysphagia - Suspected due to lithium toxicity - did have recent EGD June 2023, cannot see results, but per GI had gastritis and Candida esophagitis - unfortunately no signs of improvement still - see GOC - evaluated by SLP - continue offering diet as able; poor NGT and PEG candidate; not appropriate for TPN  Prolonged QT interval - Likely related to elevated lithium level - patient pulling off tele too much for meaning capture; okay to d/c - serial EKGs as he allows too - Hold QT prolonging agents when possible  Hyperlipidemia - hold statin  Bipolar 1 disorder ([place]) - see lithium toxicity  - haldol / ativan PRN  Contamination of blood culture-resolved as of 09/09/2022 - 1/4 bottles noted with GPC from 12/20 but apparently nothing seen on media per micro. Has remained afebrile with minimal leukocytosis (considered reactive from other processes) - will repeat blood culture if becomes febrile   Antimicrobials:   DVT prophylaxis:  heparin injection 5,000 Units Start: 09/05/22 2200 SCD's Start: 09/05/22 2038   Code Status:   Code Status:  DNR  Mobility Assessment (last 72 hours)     Mobility Assessment     Row Name 09/11/22 1100 09/11/22 0800 09/10/22 2000       Does patient have an order for bedrest or is patient medically unstable No - Continue assessment No - Continue assessment No - Continue assessment     What is the highest level of mobility based on the progressive mobility assessment? Level 1 (Bedfast) - Unable to balance while sitting on edge of bed -- Level 5 (Walks with assist in room/hall) - Balance while stepping forward/back and can walk in room with assist - Complete              Barriers to discharge:  Disposition Plan: may  need hospice house vs home with hospice --Family contact---I met with  [name] -[phone],  on 09/13/2022 to have further goals of care conversations -As per [name] does not have any living family members. His last living relative passed 3 months ago and he has no children   Status is: Inpatient  Objective: Blood pressure 105/66, pulse (!) 44, temperature 97.7 F (36.5 C), resp. rate 16, height _0  (1.727 m), weight 59.4 kg, SpO2 100 %.   Physical Exam Gen:-Frail and cachectic appearing, alternate between agitation and lethargy after medication HEENT:-  [place].AT, No sclera icterus Neck-Supple Neck,No JVD,.  Lungs-  CTAB , fair air movement bilaterally  CV- S1, S2 normal, RRR Abd-  +ve B.Sounds, Abd Soft, No tenderness,    Extremity/Skin:- No  edema,   good pedal pulses  NeuroPsych--alternate between agitation and lethargy after medication  Consultants:  -Palliative care -Chaplain    LOS: 7 days   Roxan Hockey, MD [facility] 09/13/2022, 6:53 PM

## 2022-09-13 NOTE — Progress Notes (Signed)
OT Cancellation Note  Patient Details Name: [name] MRN: [id number] DOB: October 23, 1960   Cancelled Treatment:    Reason Eval/Treat Not Completed: Patient not medically ready. Pt has received multiple doses of Ativan throughout the night and morning. Upon further review of pt's chart, he continues to not be medically appropriate for rehab at this time. Will monitor pt's chart and treat if status improves.   Fortino Haag Elane Vear [place] 09/13/2022, 9:45 AM

## 2022-09-13 NOTE — Progress Notes (Signed)
Genevieve from poison control; called to receive update on pt. Was made aware of the follow up conversation that attending had with long time girl friend and nursing staff in family room that He was made DNR and continued on medical treatment in the hopes for some improvement however his prognosis was made clear that it appears to be worsening daily.

## 2022-09-13 NOTE — Progress Notes (Signed)
Palliative: Thank you for this consult.  Unfortunately, due to illness/staffing issues there will be a delay in a Palliative Provider seeing this patient. Palliative Medicine will return to service on 09/18/2022 and will see patient at that time, if patient is still hospitalized.  No charge  Salvatore Shear, NP Palliative Medicine Please call Palliative Medicine team phone with any questions [phone]. For individual providers please see AMION. 

## 2022-09-14 DIAGNOSIS — E870 Hyperosmolality and hypernatremia: Secondary | ICD-10-CM | POA: Diagnosis not present

## 2022-09-14 DIAGNOSIS — T56891A Toxic effect of other metals, accidental (unintentional), initial encounter: Secondary | ICD-10-CM | POA: Diagnosis not present

## 2022-09-14 DIAGNOSIS — F319 Bipolar disorder, unspecified: Secondary | ICD-10-CM | POA: Diagnosis not present

## 2022-09-14 LAB — COMPREHENSIVE METABOLIC PANEL
ALT: 34 U/L (ref 0–44)
AST: 32 U/L (ref 15–41)
Albumin: 3.1 g/dL — ABNORMAL LOW (ref 3.5–5.0)
Alkaline Phosphatase: 89 U/L (ref 38–126)
Anion gap: 7 (ref 5–15)
BUN: 8 mg/dL (ref 8–23)
CO2: 25 mmol/L (ref 22–32)
Calcium: 9.1 mg/dL (ref 8.9–10.3)
Chloride: 112 mmol/L — ABNORMAL HIGH (ref 98–111)
Creatinine, Ser: 0.74 mg/dL (ref 0.61–1.24)
GFR, Estimated: >60 mL/min (ref >60)
Glucose, Bld: 98 mg/dL (ref 70–99)
Potassium: 3.5 mmol/L (ref 3.5–5.1)
Sodium: 144 mmol/L (ref 135–145)
Total Bilirubin: 0.5 mg/dL (ref 0.3–1.2)
Total Protein: 6.7 g/dL (ref 6.5–8.1)

## 2022-09-14 LAB — CBC
HCT: 44.6 % (ref 39.0–52.0)
Hemoglobin: 13.7 g/dL (ref 13.0–17.0)
MCH: 24.5 pg — ABNORMAL LOW (ref 26.0–34.0)
MCHC: 30.7 g/dL (ref 30.0–36.0)
MCV: 79.6 fL — ABNORMAL LOW (ref 80.0–100.0)
Platelets: 175 10*3/uL (ref 150–400)
RBC: 5.6 MIL/uL (ref 4.22–5.81)
RDW: 16.2 % — ABNORMAL HIGH (ref 11.5–15.5)
WBC: 4.4 10*3/uL (ref 4.0–10.5)
nRBC: 0 % (ref 0.0–0.2)

## 2022-09-14 LAB — MAGNESIUM: Magnesium: 1.8 mg/dL (ref 1.7–2.4)
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
## 2022-09-14 NOTE — Progress Notes (Signed)
Patient is resting with eyes closed, sitter at bedside.

## 2022-09-14 NOTE — Progress Notes (Signed)
OT Cancellation Note  Patient Details Name: [name] MRN: [id number] DOB: Feb 13, 1961   Cancelled Treatment:    Reason Eval/Treat Not Completed: Patient not medically ready. Pt continues to not be able to follow any directions. He has had multiple doses of Ativan and Haldol through the night. Spoke with RN after chart review and agreed that pt is not appropriate for therapies at this time. OT will discharge from service, however if pt has medical improvement and is able to participate in therapy, please reconsult.   Trish Mage, OTR/L [facility] Acute Rehab [name] [name] [place] 09/14/2022, 8:58 AM

## 2022-09-14 NOTE — Progress Notes (Signed)
Patient was given Haldol once and Ativan twice this shift to calm patient down and prevent agitation. Patient did attempt to exit the bed with no success. Patient only alert to self. New IV was obtain due to patient pulled the other out. Sitter in the room with patient. This nurse also continued to monitor patient.

## 2022-09-15 DIAGNOSIS — F319 Bipolar disorder, unspecified: Secondary | ICD-10-CM | POA: Diagnosis not present

## 2022-09-15 DIAGNOSIS — F03918 Unspecified dementia, unspecified severity, with other behavioral disturbance: Secondary | ICD-10-CM | POA: Diagnosis present

## 2022-09-15 DIAGNOSIS — T56891A Toxic effect of other metals, accidental (unintentional), initial encounter: Secondary | ICD-10-CM | POA: Diagnosis not present

## 2022-09-15 DIAGNOSIS — R627 Adult failure to thrive: Secondary | ICD-10-CM | POA: Diagnosis present

## 2022-09-15 LAB — GLUCOSE, CAPILLARY: Glucose-Capillary: 111 mg/dL — ABNORMAL HIGH (ref 70–99)

## 2022-09-15 NOTE — Plan of Care (Signed)
?  Problem: Education: ?Goal: Knowledge of General Education information will improve ?Description: Including pain rating scale, medication(s)/side effects and non-pharmacologic comfort measures ?Outcome: Progressing ?  ?Problem: Health Behavior/Discharge Planning: ?Goal: Ability to manage health-related needs will improve ?Outcome: Progressing ?  ?Problem: Coping: ?Goal: Level of anxiety will decrease ?Outcome: Progressing ?  ?

## 2022-09-15 NOTE — Progress Notes (Addendum)
Progress Note    [name]   ZOX:[id number]  DOB: 26-Nov-1960  DOA: 09/05/2022     9 PCP: [facility]  Initial CC: AMS  Hospital Course: [name] is a 61 yo male with PMH dementia, bipolar 1 disorder, HLD, HTN, CAD, CVA who presented with confusion and dysphagia. With further workup he was also found to be agitated/impulsive, and unable to be redirected.  He required Haldol and Ativan.  His significant other was concerned about his inability to swallow and he was brought for further workup. Because of the difficulty eating/dysphagia, he was initially initiated on a stroke workup.  However, further workup also was notable for an elevated lithium level.  Given his presenting symptoms, it was felt that they were attributed to lithium toxicity.  Poison control was called and patient was started on fluids and admitted. Despite aggressive measures, he showed little improvement during hospitalization.  Given his poor physical reserve and declining functional status leading up to hospitalization, he appeared to be more consistent with failure to thrive.  Goals of care discussions were held with his significant other who is his main caretaker and decision maker.  He was made DNR and continued on medical treatment in the hopes for some improvement however his prognosis was made clear that it appears to be worsening daily.  Interval History:  Sitter at bedside,  09/15/22 -Awaiting patient's girlfriend to make decisions regarding transition of care possibly to comfort care -Currently still on IV dextrose solution, oral intake remains very poor  Assessment and Plan: 1)Lithium Toxicity - Lithium level 1.51 on admission -Etiology possibly due to chronic toxicity versus possible extra doses unintentionally (wife/partner not certain) - Patient presents with predominate neuro signs (agitation, confusion, dysphagia) supporting chronic toxicity; also cardiac effect  with prolonged Qtc -Poison center aware - Continue iv fluids until significant other can make further decisions regarding possible transition to comfort care - lithium level normalized and dropped - Caution use of Haldol for his agitation.  Try to use Ativan more - appreciate tele-psych rec's; he has been taken off lithium and started on depakote; continue p.o. 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  He remains minimally interactive and impulsive, constantly trying to climb out of bed.  He has pulled IV out as well as telemetry off repetitively.   -He also has had extremely minimal nutritional  intake since admission.   -He is not a good candidate for NG tube placement as he will inevitably pull this out as well and PEG tube placement also considered medically futile and inappropriate in context of his multiple comorbidities, poor quality of life, and progressive functional decline - Discussed with [name] that he does not appear to have the physical reserve to overcome the events that have led up to this hospitalization.  He may also not survive this hospitalization.  Lack of nutrition will cause him to continue to rapidly decline which is also not unexpected [name] agrees with DNR status at this time. She does wish for ongoing attempts/treatment to see if he improves, but should he continue to decline (which I expect), then a natural death will be allowed. He is currently not a rehab candidate unless were to show tremendous improvement and rather he appears more appropriate for inpatient vs residential hospice depending on course of events over the next few days -Overall prognosis remains poor/grave -09/15/22 -Awaiting patient's girlfriend to make decisions regarding transition of care possibly to comfort care -Currently still on IV dextrose solution, oral intake remains very poor  4)Hypernatremia -Hyponatremia resolved with D5 water solution/hydration  5)Acute metabolic encephalopathy in setting of baseline advanced/severe dementia with significant cognitive and memory deficits at baseline - Etiology attributed to probable lithium toxicity - CT head unremarkable for acute findings.  Chronic left frontal lobe infarct appreciated -MRI brain also negative for acute stroke -Patient is reported to have some altered mentation at baseline as well; per wife, he is mostly nonverbal at baseline----please see #2 above -Continue sitter - see [name]  6)Dysphagia - Suspected due to lithium toxicity - did have recent EGD June 2023, cannot see results, but per GI had gastritis and Candida  esophagitis - unfortunately no signs of improvement still - see GOC - evaluated by SLP - continue offering diet as able; poor NGT and PEG candidate; not appropriate for TPN  Prolonged QT interval - Likely related to elevated lithium level - patient pulling off tele too much for meaning capture; okay to d/c - serial EKGs as he allows too - Hold QT prolonging agents when possible  Hyperlipidemia - hold statin  Bipolar 1 disorder ([place]) - see lithium toxicity above - haldol / ativan PRN  Contamination of blood culture-resolved as of 09/09/2022 - 1/4 bottles noted with GPC from 12/20 but apparently nothing seen on media per micro. Has remained afebrile with minimal leukocytosis (considered reactive from other processes) - will repeat blood culture if becomes febrile   Antimicrobials:   DVT prophylaxis:  heparin injection 5,000 Units Start: 09/05/22 2200 SCD's Start: 09/05/22 2038   Code Status:   Code Status: DNR  Mobility Assessment (last 72 hours)     Mobility Assessment     Row Name 09/15/22 0800 09/14/22 0900         Does patient have an order for bedrest or is patient medically unstable No - Continue assessment No - Continue assessment      What is the highest level of mobility based on the progressive mobility assessment? Level 1 (Bedfast) - Unable to balance while sitting on edge of bed Level 1 (Bedfast) - Unable to balance while sitting on  edge of bed               Barriers to discharge:  Disposition Plan: may  need hospice house vs home with hospice --Family contact---I met with  [facility] -[phone],  on 09/13/2022 to have further goals of care conversations -As per [name] Scales .... [place] does not have any living family members. His last close blood relative passed 3 months ago and he has no children  09/15/22 -Awaiting patient's girlfriend to make decisions regarding transition of care possibly to comfort care -Currently still on IV dextrose  solution, oral intake remains very poor  Status is: Inpatient  Objective: Blood pressure 132/80, pulse (!) 50, temperature 97.6 F (36.4 C), temperature source Axillary, resp. rate 18, height _0  (1.727 m), weight 59.4 kg, SpO2 100 %.   Physical Exam Gen:-Frail and cachectic appearing, alternate between agitation and lethargy after medication HEENT:- [place].AT, No sclera icterus Neck-Supple Neck,No JVD,.  Lungs-  CTAB , fair air movement bilaterally  CV- S1, S2 normal, RRR Abd-  +ve B.Sounds, Abd Soft, No tenderness,    Extremity/Skin:- No  edema,   good pedal pulses  NeuroPsych--alternates between agitation and lethargy after medication  Consultants:  -Palliative care -Chaplain    LOS: 9 days   Roxan Hockey, MD [facility] 09/15/2022, 4:36 PM

## 2022-09-16 DIAGNOSIS — R627 Adult failure to thrive: Secondary | ICD-10-CM | POA: Diagnosis not present

## 2022-09-16 DIAGNOSIS — T56891A Toxic effect of other metals, accidental (unintentional), initial encounter: Secondary | ICD-10-CM | POA: Diagnosis not present

## 2022-09-16 LAB — GLUCOSE, CAPILLARY
Glucose-Capillary: 104 mg/dL — ABNORMAL HIGH (ref 70–99)
Glucose-Capillary: 90 mg/dL (ref 70–99)
Glucose-Capillary: 94 mg/dL (ref 70–99)
Glucose-Capillary: 96 mg/dL (ref 70–99)
Glucose-Capillary: 97 mg/dL (ref 70–99)

## 2022-09-16 MED ORDER — NYSTATIN 100000 UNIT/GM EX POWD
Freq: Two times a day (BID) | CUTANEOUS | Status: DC
  Administered 2022-09-16: 1 via TOPICAL
  Filled 2022-09-16 (×5): qty 15

## 2022-09-16 NOTE — Progress Notes (Signed)
Progress Note    [name]   AXE:[id number]  DOB: October 21, 1960  DOA: 09/05/2022     10 PCP: [facility]  Initial CC: AMS  Hospital Course: [name] is a 61 yo male with PMH dementia, bipolar 1 disorder, HLD, HTN, CAD, CVA who presented with confusion and dysphagia. With further workup he was also found to be agitated/impulsive, and unable to be redirected.  He required Haldol and Ativan.  His significant other was concerned about his inability to swallow and he was brought for further workup. Because of the difficulty eating/dysphagia, he was initially initiated on a stroke workup.  However, further workup also was notable for an elevated lithium level.  Given his presenting symptoms, it was felt that they were attributed to lithium toxicity.  Poison control was called and patient was started on fluids and admitted. Despite aggressive measures, he showed little improvement during hospitalization.  Given his poor physical reserve and declining functional status leading up to hospitalization, he appeared to be more consistent with failure to thrive.  Goals of care discussions were held with his significant other who is his main caretaker and decision maker.  He was made DNR and continued on medical treatment in the hopes for some improvement however his prognosis was made clear that it appears to be worsening daily.  Interval History:  Sitter at bedside,   09/16/22 -I Called and Discussed with  patient's girlfriend -She states that she needs another couple of weeks before she can make any decisions regarding possible de-escalation/transition of care -Currently still on IV dextrose solution, oral intake remains very poor  Assessment and Plan: 1)Lithium Toxicity - Lithium level 1.51 on admission -Etiology possibly due to chronic toxicity versus possible extra doses unintentionally (wife/partner not certain) - Patient presents with predominate neuro  signs (agitation, confusion, dysphagia) supporting chronic toxicity; also cardiac effect with prolonged Qtc -Poison center aware - Continue iv fluids until significant other can make further decisions regarding possible transition to comfort care - lithium level normalized and dropped - Caution use of Haldol for his agitation.  Try to use Ativan more - appreciate tele-psych rec's; he has been taken off lithium and started on depakote; continue p.o. 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  He remains minimally interactive and impulsive, constantly trying to climb out of bed.  He has pulled IV out  as well as telemetry off repetitively.   -He also has had extremely minimal nutritional intake since admission.   -He is not a good candidate for NG tube placement as he will inevitably pull this out as well and PEG tube placement also considered medically futile and inappropriate in context of his multiple comorbidities, poor quality of life, and progressive functional decline - Discussed with Sallye Ober that he does not appear to have the physical reserve to overcome the events that have led up to this hospitalization.  He may also not survive this hospitalization.  Lack of nutrition will cause him to continue to rapidly decline which is also not unexpected Sallye Ober agrees with DNR status at this time. She does wish for ongoing attempts/treatment to see if he improves, but should he continue to decline (which I expect), then a natural death will be allowed. He is currently not a rehab candidate unless were to show tremendous improvement and rather he appears more appropriate for inpatient vs residential hospice depending on course of events over the next few days -Overall prognosis remains poor/grave -Family contact--   [name] [phone] -As per [name] .... [place] does not have any living family members. His last close blood relative passed 3 months ago and he has no children   09/13/22-face-to-face care conference with significant other  09/16/22 -I Called and Discussed with  patient's girlfriend -She states that she needs another couple of weeks before she can make any decisions regarding possible de-escalation/transition of care -Currently still on IV dextrose solution, oral intake remains very poor  4)Hypernatremia -Hypernatremia resolved with D5 water solution/hydration  5)Acute metabolic encephalopathy in setting of baseline advanced/severe dementia with significant cognitive and memory deficits at baseline - Etiology attributed to probable lithium toxicity - CT head  unremarkable for acute findings.  Chronic left frontal lobe infarct appreciated -MRI brain also negative for acute stroke -Patient is reported to have some altered mentation at baseline as well; per wife, he is mostly nonverbal at baseline----please see #2 above -Continue sitter - see GOC  6)Dysphagia - Suspected due to lithium toxicity - did have recent EGD June 2023, cannot see results, but per GI had gastritis and Candida esophagitis - unfortunately no signs of improvement still - see GOC - evaluated by SLP - continue offering diet as able; poor NGT and PEG candidate; not appropriate for TPN  Prolonged QT interval - Likely related to elevated lithium level - patient pulling off tele too much for meaning capture; okay to d/c - serial EKGs as he allows too - Hold QT prolonging agents when possible  Hyperlipidemia - hold statin  Bipolar 1 disorder ([facility]) - see lithium toxicity above - haldol / ativan PRN  Contamination of blood culture-resolved as of 09/09/2022 - 1/4 bottles noted with GPC from 12/20 but apparently nothing seen on media per micro. Has remained afebrile with minimal leukocytosis (considered reactive from other processes) - will repeat blood culture if becomes febrile   Antimicrobials:   DVT prophylaxis:  heparin injection 5,000 Units Start: 09/05/22 2200 SCD's Start: 09/05/22 2038   Code Status:   Code Status: DNR  Mobility Assessment (last 72 hours)     Mobility Assessment     Row Name  09/15/22 0800 09/14/22 0900         Does patient have an order for bedrest or is patient medically unstable No - Continue assessment No - Continue assessment      What is the highest level of mobility based on the progressive mobility assessment? Level 1 (Bedfast) - Unable to balance while sitting on edge of bed Level 1 (Bedfast) - Unable to balance while sitting on edge of bed               Barriers to discharge:  Disposition Plan: may  need hospice house vs  home with hospice --Family contact--   [name] [phone] -As per [name] .... [name] does not have any living family members. His last close blood relative passed 3 months ago and he has no children   09/16/22 -I Called and Discussed with  patient's girlfriend -She states that she needs another couple of weeks before she can make any decisions regarding possible de-escalation/transition of care -Currently still on IV dextrose solution, oral intake remains very poor  Status is: Inpatient  Objective: Blood pressure 118/76, pulse (!) 56, temperature (!) 97.5 F (36.4 C), temperature source Oral, resp. rate 15, height 5\' 8"  (1.727 m), weight 59.4 kg, SpO2 100 %.   Physical Exam Gen:-Frail and cachectic appearing, alternate between agitation and lethargy after medication HEENT:- [place].AT, No sclera icterus Neck-Supple Neck,No JVD,.  Lungs-  CTAB , fair air movement bilaterally  CV- S1, S2 normal, RRR Abd-  +ve B.Sounds, Abd Soft, No tenderness,    Extremity/Skin:- No  edema,   good pedal pulses  NeuroPsych--alternates between agitation and lethargy after medication  Consultants:  -Palliative care -Chaplain    LOS: 10 days   , MD [facility] 09/16/2022, 11:26 AM

## 2022-09-17 ENCOUNTER — Inpatient Hospital Stay (HOSPITAL_COMMUNITY): Payer: Medicare Other

## 2022-09-17 DIAGNOSIS — F03918 Unspecified dementia, unspecified severity, with other behavioral disturbance: Secondary | ICD-10-CM

## 2022-09-17 DIAGNOSIS — R627 Adult failure to thrive: Secondary | ICD-10-CM | POA: Diagnosis not present

## 2022-09-17 DIAGNOSIS — T56891A Toxic effect of other metals, accidental (unintentional), initial encounter: Secondary | ICD-10-CM | POA: Diagnosis not present

## 2022-09-17 LAB — CBC
HCT: 45.2 % (ref 39.0–52.0)
Hemoglobin: 14 g/dL (ref 13.0–17.0)
MCH: 24.5 pg — ABNORMAL LOW (ref 26.0–34.0)
MCHC: 31 g/dL (ref 30.0–36.0)
MCV: 79.2 fL — ABNORMAL LOW (ref 80.0–100.0)
Platelets: 163 10*3/uL (ref 150–400)
RBC: 5.71 MIL/uL (ref 4.22–5.81)
RDW: 16.5 % — ABNORMAL HIGH (ref 11.5–15.5)
WBC: 14.1 10*3/uL — ABNORMAL HIGH (ref 4.0–10.5)
nRBC: 0 % (ref 0.0–0.2)

## 2022-09-17 LAB — RENAL FUNCTION PANEL
Albumin: 3 g/dL — ABNORMAL LOW (ref 3.5–5.0)
Anion gap: 10 (ref 5–15)
BUN: 10 mg/dL (ref 8–23)
CO2: 22 mmol/L (ref 22–32)
Calcium: 9 mg/dL (ref 8.9–10.3)
Chloride: 106 mmol/L (ref 98–111)
Creatinine, Ser: 0.8 mg/dL (ref 0.61–1.24)
GFR, Estimated: >60 mL/min (ref >60)
Glucose, Bld: 97 mg/dL (ref 70–99)
Phosphorus: 2.4 mg/dL — ABNORMAL LOW (ref 2.5–4.6)
Potassium: 3.8 mmol/L (ref 3.5–5.1)
Sodium: 138 mmol/L (ref 135–145)

## 2022-09-17 LAB — GLUCOSE, CAPILLARY: Glucose-Capillary: 117 mg/dL — ABNORMAL HIGH (ref 70–99)

## 2022-09-17 NOTE — Progress Notes (Addendum)
Progress Note    [name]   AYT:[id number]  DOB: 26-May-1961  DOA: 09/05/2022     11 PCP: [facility]  Initial CC: AMS  Hospital Course: [name] is a 62 yo male with PMH dementia, bipolar 1 disorder, HLD, HTN, CAD, CVA who presented with confusion and dysphagia. With further workup he was also found to be agitated/impulsive, and unable to be redirected.  He required Haldol and Ativan.  His significant other was concerned about his inability to swallow and he was brought for further workup. Because of the difficulty eating/dysphagia, he was initially initiated on a stroke workup.  However, further workup also was notable for an elevated lithium level.  Given his presenting symptoms, it was felt that they were attributed to lithium toxicity.  Poison control was called and patient was started on fluids and admitted. Despite aggressive measures, he showed little improvement during hospitalization.  Given his poor physical reserve and declining functional status leading up to hospitalization, he appeared to be more consistent with failure to thrive.  Goals of care discussions were held with his significant other who is his main caretaker and decision maker.  He was made DNR and continued on medical treatment in the hopes for some improvement however his prognosis was made clear that it appears to be worsening daily.  Interval History:   -Appears comfortable overall -Not alert enough to have significant oral intake  09/17/22 -I Called and Discussed with  patient's girlfriend on 09/16/22 -She tells me that she is not ready to make any decisions about possible de-escalation of care until the middle of January at the end --Currently still on IV dextrose solution, oral intake remains very poor  Assessment and Plan: 1)Lithium Toxicity - Lithium level 1.51 on admission -Etiology possibly due to chronic toxicity versus possible extra doses unintentionally  (wife/partner not certain) - Patient presents with predominate neuro signs (agitation, confusion, dysphagia) supporting chronic toxicity; also cardiac effect with prolonged Qtc -Poison center aware - Continue iv fluids until significant other can make further decisions regarding possible transition to comfort care - lithium level normalized and dropped - Caution use of Haldol for his agitation.  Try to use Ativan more - appreciate tele-psych rec's; he has been taken off lithium and started on depakote; continue p.o. 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 -The last time he was able to put a couple words together in a phrase was apparently around June 2023 when he said that the garage where he used to do his Dealer work was on [address] -otherwise over the last 12 months patient may answer with one-word answers, significant cognitive decline -Patient lost over 40 pounds of weight due to poor  oral intake   3)Goals of care, counseling/discussion - Long conversation held with [name] on 09/10/2022 and again on 09/13/2022 - patient has been hospitalized for days at that point and he has shown no signs of improvement.  He remains minimally interactive and  impulsive, constantly trying to climb out of bed.  He has pulled IV out as well as telemetry off repetitively.   -He also has had extremely minimal nutritional intake since admission.   -He is not a good candidate for NG tube placement as he will inevitably pull this out as well and PEG tube placement also considered medically futile and inappropriate in context of his multiple comorbidities, poor quality of life, and progressive functional decline - Discussed with [name] that he does not appear to have the physical reserve to overcome the events that have led up to this hospitalization.  He may also not survive this hospitalization.  Lack of nutrition will cause him to continue to rapidly decline which is also not unexpected [name] agrees with DNR status at this time. She does wish for ongoing attempts/treatment to see if he improves, but should he continue to decline (which I expect), then a natural death will be allowed. He is currently not a rehab candidate unless were to show tremendous improvement and rather he appears more appropriate for inpatient vs residential hospice depending on course of events over the next few days -Overall prognosis remains poor/grave -Family contact--   [name] [phone] -As per Tyler [name] [name] .... [name] does not have any living family members. His last close blood relative passed 3 months ago and he has no children   09/13/22-face-to-face care conference with significant other  09/17/22 -I Called and Discussed with  patient's girlfriend on 09/16/22 -She tells me that she is not ready to make any decisions about possible de-escalation of care until the middle of January at the end --Currently still on IV dextrose solution, oral intake remains very poor  4)Hypernatremia -Hypernatremia resolved with D5 water solution/hydration  5)Acute metabolic encephalopathy in setting of baseline advanced/severe dementia with significant cognitive and memory deficits  at baseline - Etiology attributed to probable lithium toxicity - CT head unremarkable for acute findings.  Chronic left frontal lobe infarct appreciated -MRI brain also negative for acute stroke -Patient is reported to have some altered mentation at baseline as well; per wife, he is mostly nonverbal at baseline----please see #2 above -Continue sitter - see GOC  6)Dysphagia - Suspected due to lithium toxicity - did have recent EGD June 2023, cannot see results, but per GI had gastritis and Candida esophagitis - unfortunately no signs of improvement still - see GOC - evaluated by SLP - continue offering diet as able; poor NGT and PEG candidate; not appropriate for TPN  7)Leukocytosis--- no fevers no overall change in patient's condition partially ??  Etiology.... May be reactive  -Chest x-ray on 09/17/2022 without acute findings -Get blood cultures, check UA -Hold off on antibiotics  Prolonged QT interval - Likely related to elevated lithium level - patient pulling off tele too much for meaning capture; okay to d/c - serial EKGs as he allows too - Hold QT prolonging agents when possible  Hyperlipidemia - hold statin  Bipolar 1 disorder (HCC) - see lithium toxicity above - haldol / ativan PRN  Contamination of blood culture-resolved as of 09/09/2022 - 1/4 bottles noted with GPC from 12/20 but apparently nothing seen on media per micro. Has remained afebrile with minimal leukocytosis (considered reactive from  other processes) - will repeat blood culture if becomes febrile   Antimicrobials:   DVT prophylaxis:  heparin injection 5,000 Units Start: 09/05/22 2200 SCD's Start: 09/05/22 2038   Code Status:   Code Status: DNR  Mobility Assessment (last 72 hours)     Mobility Assessment     Row Name 09/16/22 2100 09/15/22 0800         Does patient have an order for bedrest or is patient medically unstable No - Continue assessment No - Continue assessment      What is the  highest level of mobility based on the progressive mobility assessment? Level 1 (Bedfast) - Unable to balance while sitting on edge of bed Level 1 (Bedfast) - Unable to balance while sitting on edge of bed      Is the above level different from baseline mobility prior to current illness? Yes - Recommend PT order --               Barriers to discharge:  Disposition Plan: may  need hospice house vs home with hospice --Family contact--   [name] [phone] -As per [name] .... [name] does not have any living family members. His last close blood relative passed 3 months ago and he has no children   09/17/22 -I Called and Discussed with  patient's girlfriend on 09/16/22 -She tells me that she is not ready to make any decisions about possible de-escalation of care until the middle of January at the end --Currently still on IV dextrose solution, oral intake remains very poor  Status is: Inpatient  Objective: Blood pressure 97/80, pulse 97, temperature 97.7 F (36.5 C), temperature source Oral, resp. rate 20, height 5\' 8"  (1.727 m), weight 59.4 kg, SpO2 97 %.   Physical Exam Gen:-Frail and cachectic appearing, alternate between agitation and lethargy after medication HEENT:- [place].AT, No sclera icterus Neck-Supple Neck,No JVD,.  Lungs-  CTAB , fair air movement bilaterally  CV- S1, S2 normal, RRR Abd-  +ve B.Sounds, Abd Soft, No tenderness,    Extremity/Skin:- No  edema,   good pedal pulses  NeuroPsych--alternates between agitation and lethargy after medication  Consultants:  -Palliative care -Chaplain    LOS: 11 days   Roxan Hockey, MD [facility] 09/17/2022, 11:14 AM

## 2022-09-18 DIAGNOSIS — Z515 Encounter for palliative care: Secondary | ICD-10-CM

## 2022-09-18 DIAGNOSIS — R4189 Other symptoms and signs involving cognitive functions and awareness: Secondary | ICD-10-CM

## 2022-09-18 DIAGNOSIS — T56891A Toxic effect of other metals, accidental (unintentional), initial encounter: Secondary | ICD-10-CM

## 2022-09-18 DIAGNOSIS — F319 Bipolar disorder, unspecified: Secondary | ICD-10-CM

## 2022-09-18 DIAGNOSIS — R627 Adult failure to thrive: Secondary | ICD-10-CM

## 2022-09-18 DIAGNOSIS — Z7189 Other specified counseling: Secondary | ICD-10-CM

## 2022-09-18 DIAGNOSIS — G9341 Metabolic encephalopathy: Secondary | ICD-10-CM

## 2022-09-18 DIAGNOSIS — F03918 Unspecified dementia, unspecified severity, with other behavioral disturbance: Secondary | ICD-10-CM

## 2022-09-18 DIAGNOSIS — T56894D Toxic effect of other metals, undetermined, subsequent encounter: Secondary | ICD-10-CM | POA: Diagnosis not present

## 2022-09-18 LAB — GLUCOSE, CAPILLARY
Glucose-Capillary: 100 mg/dL — ABNORMAL HIGH (ref 70–99)
Glucose-Capillary: 108 mg/dL — ABNORMAL HIGH (ref 70–99)
Glucose-Capillary: 69 mg/dL — ABNORMAL LOW (ref 70–99)
Glucose-Capillary: 81 mg/dL (ref 70–99)
Glucose-Capillary: 82 mg/dL (ref 70–99)
Glucose-Capillary: 84 mg/dL (ref 70–99)

## 2022-09-18 NOTE — Progress Notes (Signed)
Bladder scan revealed >21mL, Dr. Denton Brick made aware. Advised this nurse to bladder scan at 2100, this nurse will notify oncoming shift.

## 2022-09-18 NOTE — Progress Notes (Signed)
   09/18/22 -Had face-to-face Conference with patient's girlfriend on 09/13/2022 -I Called and Discussed with  patient's girlfriend on 09/16/22 -Patient girlfriend requested that I do Not call her to talk to her about patient's poor prognosis anymore, because it makes her sad.- -Patient's girlfriend is accusing the medical team of "prematurely giving up on patient" --She tells me that she is not ready to make any decisions about possible de-escalation of care until the middle of January at the earliest -oral intake remains very poor -Hypoglycemia with blood sugar of 69 noted despite continuous IV dextrose drip-- -will increase IV dextrose rate -Overall prognosis remains poor given very poor oral intake due to persistent metabolic encephalopathy superimposed on advanced dementia  Roxan Hockey, MD

## 2022-09-18 NOTE — Progress Notes (Signed)
Progress Note    AERIK POLAN   FGH:[id number]  DOB: [birth year]  DOA: 09/05/2022     12 PCP: [facility]  Initial CC: AMS  Hospital Course: Mr. Cueto is a 62 yo male with PMH dementia, bipolar 1 disorder, HLD, HTN, CAD, CVA who presented with confusion and dysphagia. With further workup he was also found to be agitated/impulsive, and unable to be redirected.  He required Haldol and Ativan.  His significant other was concerned about his inability to swallow and he was brought for further workup. Because of the difficulty eating/dysphagia, he was initially initiated on a stroke workup.  However, further workup also was notable for an elevated lithium level.  Given his presenting symptoms, it was felt that they were attributed to lithium toxicity.  Poison control was called and patient was started on fluids and admitted. Despite aggressive measures, he showed little improvement during hospitalization.  Given his poor physical reserve and declining functional status leading up to hospitalization, he appeared to be more consistent with failure to thrive.  Goals of care discussions were held with his significant other who is his main caretaker and decision maker.  He was made DNR and continued on medical treatment in the hopes for some improvement however his prognosis was made clear that it appears to be worsening daily.  Interval History:   -Appears comfortable overall  09/18/22 -Had face-to-face Conference with patient's girlfriend on 09/13/2022 -I Called and Discussed with  patient's girlfriend on 09/16/22 -Patient girlfriend requested that I do Not call her to talk to her about patient's poor prognosis anymore, because it makes her sad.- -Patient's girlfriend is accusing the medical team of "prematurely giving up on patient" --She tells me that she is not ready to make any decisions about possible de-escalation of care until the middle of January at  the earliest -oral intake remains very poor -Hypoglycemia with blood sugar of 69 noted despite continuous IV dextrose drip-- -will increase IV dextrose rate -Overall prognosis remains poor given very poor oral intake due to persistent metabolic encephalopathy superimposed on advanced dementia  Assessment and Plan: 1)Lithium Toxicity - Lithium level 1.51 on admission -Etiology possibly due to chronic toxicity versus possible extra doses unintentionally (wife/partner not certain) - Patient presents with predominate neuro signs (agitation, confusion, dysphagia) supporting chronic toxicity; also cardiac effect with prolonged Qtc -Poison center aware - Continue iv fluids until significant other can make further decisions regarding possible transition to comfort care - lithium level normalized and dropped - Caution use of Haldol for his agitation.  Try to use Ativan more - appreciate tele-psych rec's; he has been taken off lithium and started on depakote; continue p.o. 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  He remains minimally interactive and impulsive, constantly trying to climb out of bed.  He has pulled IV out as well as telemetry off repetitively.   -He also has had extremely minimal nutritional intake since admission.   -He is not a good candidate for NG tube placement as he will inevitably pull this out as well and PEG tube placement also considered medically futile and inappropriate in context of his multiple comorbidities, poor quality of life, and progressive functional decline - Discussed with Barbaraann Share that he does not appear to have the physical reserve to overcome the events that have led up to this hospitalization.  He may also not survive this hospitalization.  Lack of nutrition will cause him to continue to rapidly decline which is also not unexpected Barbaraann Share agrees with DNR status at this time. She does wish for ongoing attempts/treatment to see if he improves, but should he continue to decline (which I expect), then a natural death will be allowed. He is currently not a rehab candidate unless were to show tremendous improvement and rather he appears more appropriate for inpatient vs residential hospice depending on course of events over the next few days -Overall prognosis remains poor/grave -Family contact--   Richardean Chimera [phone] -As per [name] .... Everardo does not have any living family members. His last close blood relative passed 3 months ago and he has no children  -09/13/22-face-to-face care conference with significant other  09/18/22 -Had face-to-face  Conference with patient's girlfriend on 09/13/2022 -I Called and Discussed with  patient's girlfriend on 09/16/22 -Patient girlfriend requested that I do Not call her to talk to her about patient's poor prognosis anymore, because it makes her sad.- -Patient's girlfriend is accusing the medical team of "prematurely giving up on patient" --She tells me that she is not ready to make any decisions about possible de-escalation of care until the middle of January at the earliest -oral intake remains very poor -Hypoglycemia with blood sugar of 69 noted despite continuous IV dextrose drip-- -will increase IV dextrose rate -Overall prognosis remains poor given very poor oral intake due to persistent metabolic encephalopathy superimposed on advanced dementia  4)Hypernatremia -Hypernatremia resolved with D5 water solution/hydration  5)Acute metabolic encephalopathy in setting of baseline advanced/severe dementia with significant cognitive and memory deficits at baseline - Etiology attributed to probable lithium toxicity - CT head unremarkable for acute findings.  Chronic left frontal lobe infarct appreciated -MRI brain also negative for acute stroke -Patient is reported to have some altered mentation at baseline as well; per wife, he is mostly nonverbal at baseline----please see #2 above -Continue sitter - see [place]  6)Dysphagia - Suspected due to lithium toxicity - did have recent EGD June 2023, cannot see results, but per GI had gastritis and Candida esophagitis - unfortunately no signs of improvement still - see GOC - evaluated by SLP - continue offering  diet as able; poor NGT and PEG candidate; not appropriate for TPN  7)Leukocytosis--- no fevers no overall change in patient's condition partially ??  Etiology.... May be reactive  -Chest x-ray on 09/17/2022 without acute findings -Get blood cultures, check UA -Hold off on antibiotics  Prolonged QT interval - Likely related to elevated lithium  level - patient pulling off tele too much for meaning capture; okay to d/c - serial EKGs as he allows too - Hold QT prolonging agents when possible  Hyperlipidemia - hold statin  Bipolar 1 disorder (HCC) - see lithium toxicity above - haldol / ativan PRN  Contamination of blood culture-resolved as of 09/09/2022 - 1/4 bottles noted with GPC from 12/20 but apparently nothing seen on media per micro. Has remained afebrile with minimal leukocytosis (considered reactive from other processes) - will repeat blood culture if becomes febrile   Antimicrobials:   DVT prophylaxis:  heparin injection 5,000 Units Start: 09/05/22 2200 SCD's Start: 09/05/22 2038   Code Status:   Code Status: DNR  Mobility Assessment (last 72 hours)     Mobility Assessment     Row Name 09/16/22 2100           Does patient have an order for bedrest or is patient medically unstable No - Continue assessment       What is the highest level of mobility based on the progressive mobility assessment? Level 1 (Bedfast) - Unable to balance while sitting on edge of bed       Is the above level different from baseline mobility prior to current illness? Yes - Recommend PT order                Barriers to discharge:  Disposition Plan: may  need hospice house vs home with hospice --Family contact--   Deneise Lever [phone] -As per [name] .... Bartholomew does not have any living family members. His last close blood relative passed 3 months ago and he has no children   09/18/22 -Had face-to-face Conference with patient's girlfriend on 09/13/2022 -I Called and Discussed with  patient's girlfriend on 09/16/22 -Patient girlfriend requested that I do Not call her to talk to her about patient's poor prognosis anymore, because it makes her sad.- -Patient's girlfriend is accusing the medical team of "prematurely giving up on patient" --She tells me that she is not ready to make any decisions about possible  de-escalation of care until the middle of January at the earliest -oral intake remains very poor -Hypoglycemia with blood sugar of 69 noted despite continuous IV dextrose drip-- -will increase IV dextrose rate -Overall prognosis remains poor given very poor oral intake due to persistent metabolic encephalopathy superimposed on advanced dementia  Status is: Inpatient  Objective: Blood pressure 129/86, pulse 79, temperature (!) 97.5 F (36.4 C), temperature source Axillary, resp. rate 18, height 5\' 8"  (1.727 m), weight 59.4 kg, SpO2 99 %.   Physical Exam Gen:-Frail and cachectic appearing, alternate between agitation and lethargy after medication HEENT:- [place].AT, No sclera icterus Neck-Supple Neck,No JVD,.  Lungs-  CTAB , fair air movement bilaterally  CV- S1, S2 normal, RRR Abd-  +ve B.Sounds, Abd Soft, No tenderness,    Extremity/Skin:- No  edema,   good pedal pulses  NeuroPsych--alternates between agitation and lethargy after medication  Consultants:  -Palliative care -Chaplain    LOS: 12 days   , MD [facility] 09/18/2022, 8:26 AM

## 2022-09-18 NOTE — Consult Note (Signed)
Consultation Note Date: 09/18/2022   Patient Name: [name]  DOB: November 24, 1960  MRN: [id number]  Age / Sex: 62 y.o., male  PCP: [facility] Referring Physician: Roxan Hockey, MD  Reason for Consultation: Establishing goals of care  HPI/Patient Profile: 62 y.o. male  with past medical history of dementia, stroke, bipolar 1 disorder, CAD, HTN, HLD admitted on 09/05/2022 with dysphagia with worsening agitation and confusion secondary to lithium toxicity. Hospitalization complicated by lack of improvement in the setting of a presumed underlying dementia (history of decline in cognition and initial neurology visit July 2023 but no follow up noted).   Clinical Assessment and Goals of Care: Extensive chart review. I reviewed inpatient notes and diagnostics as well as outpatient notes including neurology. Seems to have had significant decline over past few months with significant weight loss ~40 lbs, functional decline, and cognitive decline. I discussed with Dr. Denton Brick who has had conversations with significant other and is concerned about overall poor prognosis. Significant other has communicated to Dr. Denton Brick that she was told it can take 3 weeks for lithium toxicity to resolve so she is waiting this time period to allow him improvement. I also discussed with bedside RN who reports that [name] did not respond at all to blood sugar stick even when she had to stick his ear. Per report he can become agitated and resistant when providing care but is mostly unresponsive.   I met today at [facility] bedside. No family/visitors at bedside. Samari is lying in fetal position and sleeping comfortably. He does not open eyes or respond to my voice or light touch during examination. He is not eating (not alert enough for intake currently). He does not exhibit any signs of distress or discomfort.   I  attempted to call and speak with significant other, [name] Scales, but unable to reach. I did leave voicemail. Will continue to reach out to offer further support to family and patient during difficult illness journey.   Primary Decision Maker NEXT OF KIN reportedly no living family; significant other [name] Scales is closest available connection    SUMMARY OF RECOMMENDATIONS   - DNR in place - Time for outcomes has been requested by surrogate decision maker  Code Status/Advance Care Planning: DNR   Symptom Management:  Per attending.   Prognosis:  Overall prognosis poor due to acute illness and decline in the setting of gradual functional/cognitive decline over months to years.   Discharge Planning: To Be Determined      Primary Diagnoses: Present on Admission:  Acute metabolic encephalopathy  Bipolar 1 disorder (HCC)  Hyperlipidemia  Lithium toxicity  Hypernatremia  Severe/Advanced Dementia with behavioral disturbance (HCC)  FTT (failure to thrive) in adult   I have reviewed the medical record, interviewed the patient and family, and examined the patient. The following aspects are pertinent.  Past Medical History:  Diagnosis Date   Allergy    Arthritis    Bipolar disorder ([place])    Coronary artery disease  Dementia ([place])    Depression    Hyperlipidemia    Hypertension    Stroke [facility])    Social History   Socioeconomic History   Marital status: Divorced    Spouse name: engaged-[name]   Number of children: Not on file   Years of education: Not on file   Highest education level: Not on file  Occupational History   Occupation: Air traffic controller  Tobacco Use   Smoking status: Former    Types: Cigarettes    Quit date: 11/28/2009    Years since quitting: 12.8   Smokeless tobacco: Never  Vaping Use   Vaping Use: Never used  Substance and Sexual Activity   Alcohol use: Not Currently    Alcohol/week: 4.0 standard drinks of alcohol    Types: 4 Cans of  beer per week   Drug use: Not Currently    Types: Marijuana    Comment: occasional    Sexual activity: Yes    Partners: Female  Other Topics Concern   Not on file  Social History Narrative   Going to school to become an Chief Financial Officer at [employer]. Lives with fiance   Social Determinants of Health   Financial Resource Strain: Not on file  Food Insecurity: Not on file  Transportation Needs: Not on file  Physical Activity: Not on file  Stress: Not on file  Social Connections: Not on file   Family History  Problem Relation Age of Onset   Hypertension Mother    Aneurysm Mother    Alcohol abuse Father    Hyperlipidemia Father    ALS Sister    Leukemia Sister    Hyperlipidemia Brother    ALS Brother    Colon cancer Neg Hx    Scheduled Meds:  heparin  5,000 Units Subcutaneous Q8H   nystatin   Topical BID   Continuous Infusions:  dextrose 75 mL/hr at 09/18/22 0900   valproate sodium 250 mg (09/18/22 0923)   PRN Meds:.acetaminophen **OR** acetaminophen (TYLENOL) oral liquid 160 mg/5 mL **OR** acetaminophen, benztropine **OR** benztropine mesylate, haloperidol lactate, LORazepam No Known Allergies Review of Systems  Unable to perform ROS: Acuity of condition    Physical Exam Vitals and nursing note reviewed.  Constitutional:      General: He is sleeping. He is not in acute distress.    Appearance: He is cachectic. He is ill-appearing.  Cardiovascular:     Rate and Rhythm: Normal rate.  Pulmonary:     Effort: No tachypnea, accessory muscle usage or respiratory distress.  Abdominal:     General: Abdomen is flat.  Neurological:     Comments: Non-verbal; not following commands     Vital Signs: BP 129/86 (BP Location: Left Arm)   Pulse 79   Temp (!) 97.5 F (36.4 C) (Axillary)   Resp 18   Ht _0  (1.727 m)   Wt 59.4 kg   SpO2 99%   BMI 19.92 kg/m  Pain Scale: PAINAD   Pain Score: 0-No pain   SpO2: SpO2: 99 % O2 Device:SpO2: 99 % O2 Flow Rate: .   IO:  Intake/output summary:  Intake/Output Summary (Last 24 hours) at 09/18/2022 0953 Last data filed at 09/18/2022 0500 Gross per 24 hour  Intake 0 ml  Output 0 ml  Net 0 ml    LBM: Last BM Date : 09/17/22 Baseline Weight: Weight: 59.4 kg Most recent weight: Weight: 59.4 kg     Palliative Assessment/Data: 10%     Time In: 1130  Time  Total: 55 min  Greater than 50%  of this time was spent counseling and coordinating care related to the above assessment and plan.  Signed by: Vinie Sill, NP Palliative Medicine Team Pager # [phone] (M-F 8a-5p) Team Phone # [phone] (Nights/Weekends)

## 2022-09-19 DIAGNOSIS — T56894D Toxic effect of other metals, undetermined, subsequent encounter: Secondary | ICD-10-CM | POA: Diagnosis not present

## 2022-09-19 DIAGNOSIS — Z7189 Other specified counseling: Secondary | ICD-10-CM | POA: Diagnosis not present

## 2022-09-19 DIAGNOSIS — G9341 Metabolic encephalopathy: Secondary | ICD-10-CM | POA: Diagnosis not present

## 2022-09-19 DIAGNOSIS — R4189 Other symptoms and signs involving cognitive functions and awareness: Secondary | ICD-10-CM | POA: Diagnosis not present

## 2022-09-19 DIAGNOSIS — Z515 Encounter for palliative care: Secondary | ICD-10-CM | POA: Diagnosis not present

## 2022-09-19 LAB — GLUCOSE, CAPILLARY
Glucose-Capillary: 112 mg/dL — ABNORMAL HIGH (ref 70–99)
Glucose-Capillary: 84 mg/dL (ref 70–99)
Glucose-Capillary: 90 mg/dL (ref 70–99)
Glucose-Capillary: 93 mg/dL (ref 70–99)

## 2022-09-19 NOTE — Progress Notes (Signed)
Tech fed patient tonight and he ate 50% of his meal plus 80% of his magic cup and drank 90 ml of thickened water. He does not focus and have to get him to re-oriented each bite by telling him to open his mouth, weak at sucking through straw. He slept most of the day and tech changed condom cath for 2nd time today he rolls and repositions self in bed. See I&O's but urinating sufficiently.

## 2022-09-19 NOTE — Progress Notes (Signed)
Pt is asleep at the moment. No S/S of distress noted.
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
## 2022-09-19 NOTE — Progress Notes (Signed)
Tried to feed with the tech Almyra Free very weak and she said the other day he aspirated. Very weak refused to eat gave oral care.
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
## 2022-09-19 NOTE — Progress Notes (Signed)
Pt was agitated and attempting to get out of the bed constantly. Haldol 2 mg IV was given for agitation.

## 2022-09-19 NOTE — Progress Notes (Signed)
Patient not interactive and sleeps frequently, does not eat, feeder and refused.

## 2022-09-19 NOTE — Progress Notes (Signed)
Patient had a rattle when we were trying to feed him tech went into replace condom cath and there was copious yellow thick phlegm in bed rattle gone. Still sleeping refusing food or drink.

## 2022-09-20 ENCOUNTER — Inpatient Hospital Stay (HOSPITAL_COMMUNITY): Payer: Medicare Other

## 2022-09-20 DIAGNOSIS — T56894D Toxic effect of other metals, undetermined, subsequent encounter: Secondary | ICD-10-CM | POA: Diagnosis not present

## 2022-09-20 LAB — GLUCOSE, CAPILLARY
Glucose-Capillary: 108 mg/dL — ABNORMAL HIGH (ref 70–99)
Glucose-Capillary: 124 mg/dL — ABNORMAL HIGH (ref 70–99)
Glucose-Capillary: 87 mg/dL (ref 70–99)
Glucose-Capillary: 96 mg/dL (ref 70–99)
Glucose-Capillary: 98 mg/dL (ref 70–99)

## 2022-09-20 MED ORDER — LORAZEPAM 2 MG/ML IJ SOLN: 1.0000 mg | Freq: Once | INTRAMUSCULAR | Status: DC

## 2022-09-20 MED ORDER — LORAZEPAM 2 MG/ML IJ SOLN
2.0000 mg | Freq: Four times a day (QID) | INTRAMUSCULAR | Status: DC | PRN
  Administered 2022-09-20 – 2022-09-24 (×7): 2 mg via INTRAVENOUS
  Filled 2022-09-20 (×8): qty 1

## 2022-09-20 NOTE — BH Assessment (Signed)
As of 2107 on clinician sent the following message: "Hi, I'm Trey with TTS. I reviewed the pt's chart and I was seeking clarification on the pt's needs." to Valeria Batman, MD Roger Shelter, MD and Caswell Corwin, RN.   Clinician spoke to Caswell Corwin, RN who expressed the pt needs a full assessment however at this time pt is confused. RN recommends the pt to be assessed by a psychiatric provider in the morning.    Vertell Novak, [place], [facility], [facility] Triage Specialist [phone]

## 2022-09-20 NOTE — Progress Notes (Signed)
PROGRESS NOTE    Patient: [name]                            PCP: [facility]                    DOB: 1961/07/04            DOA: 09/05/2022 [id number]             DOS: 09/20/2022, 1:35 PM   LOS: 14 days   Date of Service: The patient was seen and examined on 09/20/2022  Subjective:   Patient was seen and examined, mental status has not changed, withdrawn not engaging in conversation Pulling IV lines ... Overnight Ativan and Haldol was used    Brief Narrative:   [name] is a 62 yo male with PMH dementia, bipolar 1 disorder, HLD, HTN, CAD, CVA who presented with confusion and dysphagia. With further workup he was also found to be agitated/impulsive, and unable to be redirected.  He required Haldol and Ativan.  His significant other was concerned about his inability to swallow and he was brought for further workup. Because of the difficulty eating/dysphagia, he was initially initiated on a stroke workup.  However, further workup also was notable for an elevated lithium level.  Given his presenting symptoms, it was felt that they were attributed to lithium toxicity.  Poison control was called and patient was started on fluids and admitted. Despite aggressive measures, he showed little improvement during hospitalization.  Given his poor physical reserve and declining functional status leading up to hospitalization, he appeared to be more consistent with failure to thrive.  Goals of care discussions were held with his significant other who is his main caretaker and decision maker.  He was made DNR and continued on medical treatment in the hopes for some improvement however his prognosis was made clear that it appears to be worsening daily.     09/18/22 -Had face-to-face Conference with patient's girlfriend on 09/13/2022 -Patient girlfriend requested that I do Not call her to talk to her about patient's poor prognosis anymore, because it makes her  sad.- -Patient's girlfriend is accusing the medical team of "prematurely giving up on patient" --She tells me that she is not ready to make any decisions about possible de-escalation of care until the middle of January at the earliest -oral intake remains very poor -Hypoglycemia with blood sugar of 69 noted despite continuous IV dextrose drip-- -will increase IV dextrose rate -Overall prognosis remains poor given very poor oral intake due to persistent metabolic encephalopathy superimposed on advanced dementia  09/19/22 -Remain calm comfortable, remained calm, comfortable, sleepy -Refused to eat or to be fed  09/20/2022 -Agitated confused overnight, Ativan and Haldol was used overnight -Appreciate palliative care following, and consulted ethics committee Current recommendation is neuroconsultation, attempt of Dobbhoff placement and initiation of tube feeding Fails we will proceed with comfort care  -------------------------------------------------------------------------------------------------------------------------------------------------------------- Assessment and Plan: 1)Lithium Toxicity Resolved  - Lithium level 1.51 on admission -Etiology possibly due to chronic toxicity versus possible extra doses unintentionally (wife/partner not certain) - Patient presents with predominate neuro signs (agitation, confusion, dysphagia) supporting chronic toxicity; also cardiac effect with prolonged Qtc -Poison center aware - Continue iv fluids until significant other can make further decisions regarding possible transition to comfort care - lithium level normalized and dropped - Caution use of Haldol for his agitation.  Try to use  Ativan more - appreciate tele-psych rec's; he has been taken off lithium and started on depakote; continue p.o. 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 [name] -No improvement-minimally interactive, impulsive,-continue to pull out IVs telemetry lines -As needed Ativan, Haldol -No nutritional intake -IV fluid limited as he is pulling the line out -Cognitively impaired -Palliative care team, and ethics committee consulted: Recommended: Attempt Dobbhoff tube and tube feeds if fails progress to comfort care Recommended Neuro consultation  - patient has been hospitalized for days at that point and he has shown no signs of improvement.  He remains minimally interactive and impulsive, constantly trying to climb out of bed.  He has pulled IV out as well as telemetry off repetitively.   -He also has had extremely minimal nutritional intake since admission.    -He  is not a good candidate for NG tube placement as he will inevitably pull this out as well and PEG tube placement also considered medically futile and inappropriate in context of his multiple comorbidities, poor quality of life, and progressive functional decline  - Discussed with Sallye Ober that he does not appear to have the physical reserve to overcome the events that have led up to this hospitalization.  He may also not survive this hospitalization.   Lack of nutrition will cause him to continue to rapidly decline which is also not unexpected  Sallye Ober agrees with DNR status at this time. She does wish for ongoing attempts/treatment to see if he improves, but should he continue to decline (which I expect), then a natural death will be allowed. He is currently not a rehab candidate unless were to show tremendous improvement and rather he appears more appropriate for inpatient vs residential hospice depending on course of events over the next few days -Overall prognosis remains poor/grave -Family contact--   Deneise Lever [phone] -As per [name] .... Shahmeer does not have any living family members. His last close blood relative passed 3 months ago and he has no children      09/18/22 -Had face-to-face Conference with patient's girlfriend on 09/13/2022  -Girlfriend not ready to withdraw care - accusing the  medical team of "prematurely giving up on patient" -oral intake remains very poor -Overall prognosis remains poor given very poor oral intake due to persistent metabolic encephalopathy superimposed on advanced dementia  09/19/22 -Patient remains sleepy, encephalopathic, not interactive -Refusing oral intake even with assistance feeds  09/20/2022 -Change in patient's status -Per ethic committee attempted to Dobbhoff tube, initiation tube feeds In consultation and input from neurology  4)Hypernatremia -Hypernatremia resolved with D5 water solution/hydration   5)Acute metabolic  encephalopathy in setting of baseline advanced/severe dementia with significant cognitive and memory deficits at baseline - Etiology attributed to probable lithium toxicity - CT head unremarkable for acute findings.  Chronic left frontal lobe infarct appreciated -MRI brain also negative for acute stroke -Patient is reported to have some altered mentation at baseline as well; per girlfriend,  he is mostly nonverbal at baseline----please see #2 above -Continue sitter - see [place]   6)Dysphagia - Suspected due to lithium toxicity - did have recent EGD June 2023, cannot see results, but per GI had gastritis and Candida esophagitis - unfortunately no signs of improvement still - see GOC - evaluated by SLP - continue offering diet as able; poor NGT and PEG candidate; not appropriate for TPN   7)Leukocytosis--- no fevers no overall change in patient's condition partially ??  Etiology.... May be reactive  -Chest x-ray on 09/17/2022 without acute findings -Get blood cultures, check UA -Hold off on antibiotics   Prolonged QT interval - Likely related to elevated lithium level - patient pulling off tele too much for meaning capture; okay to d/c - serial EKGs as he allows too - Hold QT prolonging agents when possible   Hyperlipidemia - hold statin   Bipolar 1 disorder ([place]) - see lithium toxicity above - haldol / ativan PRN   Contamination of blood culture-resolved as of 09/09/2022 - 1/4 bottles noted with GPC from 12/20 but apparently nothing seen on media per micro. Has remained afebrile with minimal leukocytosis (considered reactive from other processes) - Will Repeat blood culture if becomes febrile    -------------------------------------------------------------------------------------------------------------------------------------  DVT prophylaxis:  heparin injection 5,000 Units Start: 09/05/22 2200 SCD's Start: 09/05/22 2038   Code Status:   Code Status: DNR  Family  Communication: No family member present at bedside- attempt will be made to update daily The above findings and plan of care has been discussed with patient (and family)  in detail,  they expressed understanding and agreement of above. -Advance care planning has been discussed.   Admission status:   Status is: Inpatient Remains inpatient appropriate because: Requiring IV fluid, nutrition,     Procedures:   No admission procedures for hospital encounter.   Antimicrobials:  Anti-infectives (From admission, onward)    Start     Dose/Rate Route Frequency Ordered Stop   09/06/22 0900  voriconazole (VFEND) tablet 200 mg  Status:  Discontinued        200 mg Oral  Once 09/05/22 1941 09/07/22 0815        Medication:   heparin  5,000 Units Subcutaneous Q8H   LORazepam  1 mg Intravenous Once   nystatin   Topical BID    acetaminophen **OR** acetaminophen (TYLENOL) oral liquid 160 mg/5 mL **OR** acetaminophen, benztropine **OR** benztropine mesylate, haloperidol lactate, LORazepam   Objective:   Vitals:   09/19/22 0621 09/19/22 1311 09/19/22 2116 09/20/22 0619  BP: 125/87 94/61 103/82 98/73  Pulse: 62 60 90 64  Resp: 17 17 20 16   Temp: 97.6 F (36.4 C) 97.6 F (  36.4 C) 98 F (36.7 C) 98 F (36.7 C)  TempSrc: Oral Oral  Axillary  SpO2: 95% 100% 91% 100%  Weight:      Height:        Intake/Output Summary (Last 24 hours) at 09/20/2022 1335 Last data filed at 09/20/2022 0920 Gross per 24 hour  Intake 2274.74 ml  Output --  Net 2274.74 ml   Filed Weights   09/05/22 1237  Weight: 59.4 kg     Examination:   Physical Exam: No significant changes  Constitution: Remains sleepy lethargic, noncooperative, does not engage in conversation Psychiatric:   Not interactive-severely depressed mood noncommunicating HEENT:    Benign exam-lethargic -no obvious abnormalities Chest:         Limited exam-chest symmetric Cardio vascular:  S1/S2, RRR, No murmure, No Rubs or Gallops   pulmonary: Clear to auscultation bilaterally, respirations unlabored, negative wheezes / crackles Abdomen: Soft, non-tender, non-distended, bowel sounds,no masses, no organomegaly Muscular skeletal: Severely cachectic, muscle wasting, Limited exam -withdraws extremities to pain stimuli Neuro: Limited exam-lethargic  Extremities: No pitting edema lower extremities, +2 pulses  Skin: Dry, warm to touch, negative for any Rashes, No open wounds Wounds: per nursing documentation   ------------------------------------------------------------------------------------------------------------------------------------------    LABs:     Latest Ref Rng & Units 09/17/2022    6:45 AM 09/14/2022    5:39 AM 09/11/2022    4:55 AM  CBC  WBC 4.0 - 10.5 K/uL 14.1  4.4  7.3   Hemoglobin 13.0 - 17.0 g/dL 14.0  13.7  13.2   Hematocrit 39.0 - 52.0 % 45.2  44.6  44.2   Platelets 150 - 400 K/uL 163  175  193       Latest Ref Rng & Units 09/17/2022    6:45 AM 09/14/2022    5:39 AM 09/11/2022    4:55 AM  CMP  Glucose 70 - 99 mg/dL 97  98  93   BUN 8 - 23 mg/dL 10  8  14    Creatinine 0.61 - 1.24 mg/dL 0.80  0.74  0.95   Sodium 135 - 145 mmol/L 138  144  149   Potassium 3.5 - 5.1 mmol/L 3.8  3.5  3.5   Chloride 98 - 111 mmol/L 106  112  117   CO2 22 - 32 mmol/L 22  25  28    Calcium 8.9 - 10.3 mg/dL 9.0  9.1  9.2   Total Protein 6.5 - 8.1 g/dL  6.7  6.3   Total Bilirubin 0.3 - 1.2 mg/dL  0.5  0.6   Alkaline Phos 38 - 126 U/L  89  98   AST 15 - 41 U/L  32  34   ALT 0 - 44 U/L  34  27        Micro Results No results found for this or any previous visit (from the past 240 hour(s)).  Radiology Reports No results found.  SIGNED: Deatra James, MD, FHM. [facility],  Pager (please use [email or web address] to page/text) Please use Epic Secure Chat for non-urgent communication (7AM-7PM)  If 7PM-7AM, please contact night-coverage [email or web address], 09/20/2022, 1:35 PM

## 2022-09-20 NOTE — Progress Notes (Signed)
Patient has been sleeping and lethargic all day, urinating well. Ate bites for lunch and all his magic cup for dinner. Took mitts off because he has been so docile but now aroused since bathing and changing bed put mitts back on , restless but non-verbal.

## 2022-09-20 NOTE — Progress Notes (Signed)
Palliative:  HPI: 62 y.o. male  with past medical history of dementia, stroke, bipolar 1 disorder, CAD, HTN, HLD admitted on 09/05/2022 with dysphagia with worsening agitation and confusion secondary to lithium toxicity. Hospitalization complicated by lack of improvement in the setting of a presumed underlying dementia (history of decline in cognition and initial neurology visit July 2023 but no follow up noted).   I met today at [facility] bedside again. Discussed with Dr. Roger Shelter and RN. [name] does not respond to me other than adjusting himself in bed during my assessment. He does not resist. I am told that he will eat a few bites of food as he has responded better to certain staff members at times. Overall prognosis is very poor. I do not believe that artificial feeding is a good idea for him. I would think that he would be a significant risk for refeeding syndrome and aspiration. I attempted to call and speak with significant other, [name], who is his surrogate decision maker in the absence of documentation and living family members. Unfortunately I was unable to reach [name] to continue our conversation. She did previously [name] that when she is at work she if often unable to answer her phone. During our last conversation she acknowledged that [name]'s prognosis may be very poor but requesting a little more time to give him opportunity for improvement. I would recommend further conversations with [name] over the weekend as I am guessing she may be off work at that time. Unfortunately there is no palliative coverage until Monday 09/24/21.   Exam: Lying in fetal position. Not following commands. Will turn himself in bed at times per nursing. Frail, cachectic. Breathing regular, unlabored. Abd flat. Moves all extremities.    Plan: - DNR in place - Surrogate decision maker requests more time for outcomes - Recommend ongoing conversations with surrogate decision maker from medical team until palliative  returns to service 1/8  25 min  Vinie Sill, NP Palliative Medicine Team Pager [phone] (Please see [email or web address] for schedule) Team Phone [phone]    Greater than 50%  of this time was spent counseling and coordinating care related to the above assessment and plan

## 2022-09-20 NOTE — Progress Notes (Signed)
Pt was constantly turning in the bed which kept on setting the bed alarm. This nurse  and the NT rotated in pt room until a safety sitter arrived to the room around 0430. Pt is now asleep.

## 2022-09-20 NOTE — Ethics Note (Signed)
Ethics Consult Note  Initial contact w/ medical team 09/20/22, which was on patient's hospital day 14   Source of Consult: Dr. Roger Shelter Current attending physician/service: Deatra James, MD  Reason(s) for consult / relevant ethical question(s): Confirm appropriate surrogate decision maker  Concern for possible medical futility    Information-gathering: Discussion with source of consult Chart review 09/20/22   Narrative:  Medical situation:  Description of case from person requesting consult: patient is reported to be non-verbal at baseline, history of dementia, Bipolar 1 d/o, CVA. Presented to hospital with confusion and dysphagia. Concern for elevated lithium level. Treated for lithium toxicity but despite aggressive measures his physical and cognitive health continues to decline, largely due to failure to thrive and inability to provide nutrition to the patient in light of inability to tolerate / keeps pulling out IV lines and felt not to be a good candidate for PICC for TPN or NG/G tube for same reason.  Patient's decision-making capacity: none Patient is currently DNR code status.  Medical team has concerns that even with appropriate adequate treatment/nutrition, his underlying medical conditions will make long-term prognosis unfavorable and there is discussion of comfort measures / hospice care.  Palliative care team is also engaged.  Patient's Personal/Social Situation:  If known: patient's relevant preferences, interests, culture, religion/spirituality, social support, financial concerns, QOL considerations, etc. - unknown Advanced directive, DNR, MOST documents: none Information about surrogate decision-maker, if any: medical team has not been able to locate any next-of-kin. Patient's girlfriend/caretaker Ms. [name] Scales has been available for discussions regarding his care and has been the de Writer given that patient lacks capacity.          Recommendations / Ethical Analysis: Summary Question of appropriate surrogate decision maker: Ms. Keene Breath is an appropriate decision maker at this time per conditions/caveats detailed below  Question of possibly futile treatment: artificial nutrition / continued care which is causing distress is requested by Ms. Scales, and medical team must decide on risk/benefit of forcing this treatment on an uncooperative patient versus transition to comfort measures against the wishes of Ms. Scales  Regarding appropriate surrogate decision maker:  See: [place] 90. Medicine and Allied Occupations  90-21.13. Informed consent to health care treatment or procedure. In brief summary, this statute outlines that the following persons, in order indicated, are authorized to consent to medical treatment on behalf of the patient who does not demonstrate capacity to do so: Legally assigned guardian appointed by the court > healthcare agent appointed by legal healthcare power of attorney > healthcare agent appointed by the patient > legal spouse > majority of the patient's reasonably available parents and children who are at least 16 years of age > individual who has an established relationship with the patient, who is acting in good faith on behalf of the patient, and who can reliably convey the patient's wishes > attending physician with confirmation by another physician of the patient's condition and the necessity for treatment (unless in urgent/emergent situation)  Given above, Ms. Scales is an appropriate surrogate decision maker HOWEVER... Medical team notes that she has specifically requested NOT to receive information that the patient is getting worse / does not want to discuss poor prognosis. It should be made clear to her that if she is declining to participate in discussions around his medical condition(s) or declining to receive the honest opinions of his care team regarding  prognosis, she would not be able to make informed decisions about his  treatment and this may negate her status as Marine scientist. It is understandable that these discussions are emotional and difficult, but if she would like to remain involved in his care she needs to demonstrate appropriate engagement with the medical team.  Medical team may ask DSS to determine legal guardianship / appoint temporary guardian if there is concern the girlfriend is not able to participate / does not have patient's best interest in mind. Medical team would be ethically justified to follow statue as above and act unilaterally if all other options are exhausted (please note Ethics does NOT give legal advice please consult appropriate institutional resources if needed)   Regarding medical futility: PLEASE NOTE Ethics does NOT decide what is or is not considered medically futile treatment(s) but we can help guide this discussion if needed. Important points to consider:  1) WHICH SPECIFIC TREATMENT is the medical team considering to be potentially futile or inappropriate?  Dr. Jamesetta Geralds and I discussed artificial feeding in particular. Patient is already designated do not resuscitate in event of cardiopulmonary arrest. Ms. Keene Breath has indicated she would like other measures to see is patient improves, however his underlying problem of poor nutritional status is the greatest issue at this point which is hindering his recovery and even with that he may not recover.  2) HOW WOULD THAT TREATMENT POTENTIALLY MEET OR FAIL TO MEET GOALS in light of what patient/surrogate would consider an acceptable quality of life?  From conversation w/ Dr Roger Shelter and review of medical record, patient had poor functional status prior to hospitalization. It is important to note though that what one person may consider an unacceptable QOL may differ greatly from what another person would consider acceptable. Would discuss with Ms. Scales what level of  recovery/stabilization would be acceptable to Mr. Crisanto and then realistically determine whether those goals can be met.  3) RISKS VS BENEFITS OF THAT TREATMENT - including in this case risk of forcing treatment on an uncooperative patient which may cause that patient undue stress or pain, or risk of sedating/restraining patient to deliver the treatment, etc.  Consider neurology consult to verify prognosis and verify whether lithium toxicity is a concern at this point.   If medical team agrees that a given treatment (in this case" artificial feeding would not be helpful to this patient in terms of recovery to an acceptable QOL) and is considering withdrawing/withholding this treatment against wishes of the surrogate... OR if medical team is considering transitioning to comfort measures to ease patient's agitation and pain against wishes of the surrogate.Marland KitchenMarland Kitchen  1) If earnest, good-faith attempts have been made to achieve shared decision-making between the medical team and the patient's family/surrogate decision-maker(s), but shared decision-making is not possible (in other words, family/surrogate continue to disagree with medical recommendations), then the attending physician (in cooperation with at least one other physician consultant) is ethically justified in withdrawing or in not offering treatments IF those physicians are convinced that such measures are futile, i.e. will not improve patient's QOL, will serve no medical benefit, or may in fact cause harm.  2) If the family/surrogate(s) insists upon treatments which the medical team considers futile, they have the right to attempt transfer to a facility that agrees to provide those treatments. (Of note, there is no designated time allowance frame for transfer, but it is advised to set clear expectations as much as possible. Family ought to be making demonstrable efforts at transfer if transfer is desired. It is reasonable for attending physicians to use  their best judgment to determine deadlines for transfer, or deadlines for withdrawal of life support if transfer attempts are not successful. It is reasonable to keep patients on life support for a limited time to allow family to gather at bedside prior to withdrawal of life support.) 3) There appears to be room to attempt shared decision making with the patient's surrogates with the involvement of Ethics, and so Ethics will make 2+ committee members available for a meeting between Ethics representatives, the medical team and the patient's decision-maker to explore this more. Unit manager will be asked to arrange a time/place that is agreeable to all relevant parties. This meeting should limit its attendees to the attending physician, relevant consultants including members of the ethics committee, relevant nursing staff and other medical staff as appropriate, and patient's legal surrogate decision-maker(s) or other support parties who are authorized to receive HIPAA-protected information.    Relevant underlying ethical principles were discussed directly with Dr Roger Shelter (attending physician). Other resources were discussed directly with them, if needed. Ethics committee strives to ensure that all necessary and appropriate steps are taken by the medical team such that all decisions made for this patient are ethically appropriate. Please note that Ethics committee members will NOT offer legal or medical counsel.      Thank you for this consult. Ethics will continue to follow this case.   Dr Barrett Henle has my personal cell phone number and has my permission to share this number at their discretion.  Secure message on Epic is also welcome but may not receive an immediate response.  Please reference AMION for on-call committee member if needed.    [place]

## 2022-09-20 NOTE — Progress Notes (Signed)
At 0200: Pt removed one of his IV and and was agitated, attempting to get out of the bed. Ativan 2 mg was given which wan not effective. Haldol 2 mg was given at 0317.

## 2022-09-21 ENCOUNTER — Inpatient Hospital Stay (HOSPITAL_COMMUNITY): Payer: Medicare Other

## 2022-09-21 DIAGNOSIS — Z515 Encounter for palliative care: Secondary | ICD-10-CM | POA: Diagnosis not present

## 2022-09-21 DIAGNOSIS — R4182 Altered mental status, unspecified: Secondary | ICD-10-CM

## 2022-09-21 DIAGNOSIS — Z7189 Other specified counseling: Secondary | ICD-10-CM | POA: Diagnosis not present

## 2022-09-21 DIAGNOSIS — G9341 Metabolic encephalopathy: Secondary | ICD-10-CM | POA: Diagnosis not present

## 2022-09-21 DIAGNOSIS — E43 Unspecified severe protein-calorie malnutrition: Secondary | ICD-10-CM | POA: Insufficient documentation

## 2022-09-21 LAB — CBC
HCT: 44.9 % (ref 39.0–52.0)
Hemoglobin: 13.5 g/dL (ref 13.0–17.0)
MCH: 23.9 pg — ABNORMAL LOW (ref 26.0–34.0)
MCHC: 30.1 g/dL (ref 30.0–36.0)
MCV: 79.5 fL — ABNORMAL LOW (ref 80.0–100.0)
Platelets: 168 10*3/uL (ref 150–400)
RBC: 5.65 MIL/uL (ref 4.22–5.81)
RDW: 16.4 % — ABNORMAL HIGH (ref 11.5–15.5)
WBC: 3.3 10*3/uL — ABNORMAL LOW (ref 4.0–10.5)
nRBC: 0 % (ref 0.0–0.2)

## 2022-09-21 LAB — BASIC METABOLIC PANEL
Anion gap: 7 (ref 5–15)
BUN: 9 mg/dL (ref 8–23)
CO2: 29 mmol/L (ref 22–32)
Calcium: 9.6 mg/dL (ref 8.9–10.3)
Chloride: 102 mmol/L (ref 98–111)
Creatinine, Ser: 0.73 mg/dL (ref 0.61–1.24)
GFR, Estimated: >60 mL/min (ref >60)
Glucose, Bld: 84 mg/dL (ref 70–99)
Potassium: 3.6 mmol/L (ref 3.5–5.1)
Sodium: 138 mmol/L (ref 135–145)

## 2022-09-21 LAB — URINALYSIS, ROUTINE W REFLEX MICROSCOPIC
Bilirubin Urine: NEGATIVE
Glucose, UA: NEGATIVE mg/dL
Hgb urine dipstick: NEGATIVE
Ketones, ur: NEGATIVE mg/dL
Leukocytes,Ua: NEGATIVE
Nitrite: NEGATIVE
Protein, ur: NEGATIVE mg/dL
Specific Gravity, Urine: 1.008 (ref 1.005–1.030)
pH: 6 (ref 5.0–8.0)

## 2022-09-21 LAB — GLUCOSE, CAPILLARY
Glucose-Capillary: 98 mg/dL (ref 70–99)
Glucose-Capillary: 94 mg/dL (ref 70–99)
Glucose-Capillary: 102 mg/dL — ABNORMAL HIGH (ref 70–99)

## 2022-09-21 LAB — VALPROIC ACID LEVEL: Valproic Acid Lvl: 62 ug/mL (ref 50.0–100.0)

## 2022-09-21 MED ORDER — VALPROATE SODIUM 100 MG/ML IV SOLN
250.0000 mg | Freq: Three times a day (TID) | INTRAVENOUS | Status: DC
  Administered 2022-09-21 – 2022-09-27 (×18): 250 mg via INTRAVENOUS
  Filled 2022-09-21 (×22): qty 2.5

## 2022-09-21 NOTE — Consult Note (Cosign Needed Addendum)
FAROOQ PETROVICH is a 62 y.o. male with medical history significant of CAD, advanced dementia, bipolar 1 disorder, hyperlipidemia, hypertension, and CVA who presented to the ED with a chief complaint of dysphagia. Patient's lithium level of 1.51 on 09/05/22 showed lithium toxicity. Unfortunately patient is not able to provide any history right now.   He is hypersomnolent after having been agitated through most of the night which was not improved with Ativan and later required a dose of Haldol. Neither when he was agitated nor status post meds as he been able to provide reliable history.    Per EDP note on 09/20/22. Mr. Crochet is a 62 yo male with PMH dementia, bipolar 1 disorder, HLD, HTN, CAD, CVA who presented with confusion and dysphagia. With further workup he was also found to be agitated/impulsive, and unable to be redirected.  He required Haldol and Ativan.  His significant other was concerned about his inability to swallow and he was brought for further workup. Because of the difficulty eating/dysphagia, he was initially initiated on a stroke workup.  However, further workup also was notable for an elevated lithium level.  Given his presenting symptoms, it was felt that they were attributed to lithium toxicity.  Poison control was called and patient was started on fluids and admitted. Despite aggressive measures, he showed little improvement during hospitalization.  Given his poor physical reserve and declining functional status leading up to hospitalization, he appeared to be more consistent with failure to thrive.  Goals of care discussions were held with his significant other who is his main caretaker and decision maker.  He was made DNR and continued on medical treatment in the hopes for some improvement however his prognosis was made clear that it appears to be worsening daily.  TTS consult received for medication recommendations on 09/20/22.   Per chart review, patient is an overall poor  historian and is unable to provide answers to most history questions as he is non verbal, confused and has advanced dementia. Awaiting response from nursing staff to see if the patient is able to participate in an assessment today. Per Deanna, LPN., patient is unable to complete an assessment.   Current medication are Valproate 250  mg IV q12hrs, started on 09/11/22. Most recent valproic acid level 12/24 <10. No adjustments have been made at this time. Will increase Valproate to 250mg  po TID. Will repeat valproic acid level.   Patient with low albumin(3.0), recommend increasing albumin for better absorption of Depakote.   EKG 12/22, QTc of 542. Will defer from any additional use of antipsychotics at this time due to QTC prolongation. Will order repeat EKG, as patient has haldol ordered.   Will recommend discontinuation of benzodiazepine as this medication could be causing worsening agitation and confusion due to his long acting effects. Benzodiazepines are not recommended in geriatric population for this reason. Due to worsening agitation, combativeness, or progression of dementia.   Recommend collaborating with PMT/Hospice as the above mentioned medications will increase patients mortality and risks for cardiac events. However these meds are used often for EOL.   A secure chat sent to Dr. Roger Shelter, with the stated medication recommendations. Psychiatry to sign off at this time.

## 2022-09-21 NOTE — Consult Note (Signed)
I connected with  [name] on 09/21/22 by a video enabled telemedicine application and verified that I am speaking with the correct person using two identifiers.   I discussed the limitations of evaluation and management by telemedicine. The patient is confused and unable to provide consent.  Location of patient: [facility] Location of physician: [facility]   Neurology Consultation Reason for Consult: ams, [place] Referring Physician: Dr Skipper Cliche  CC: dysphagia  History is obtained from: chart review as patient altered  HPI: [name] is a 62 y.o. male with PMH of HTN, HLD, chronic left frontal infarct, Bipolar disorder and dementia who was brought in on 09/05/2022 for difficulty swallowing solid food for 2-7 days. Per review of ED notes, patient was able to swallow liquids but solid food would dribble out of his mouth. On 12/20, he had an episode where he was standing up and suddenly collapsed, no LOC but seemed confused. At baseline, he is able to answer yes and no questions but speech is incomprehensible. He had MR brain wo contrast on 09/06/2022 which didn't show any acute abnormality. WBC was 17, afebrile, UA bland, Blood Cx one was negative and one grew GPC. Lithium level was elevated at 1.51, UDS was positive for THC. Qtc interval was also prolonged. Psych was consulted and recommended switching to depakote. GI was consulted for dysphagia and recommended contacting them after mental status improves. During the hospitalization,patent has remained non-verbal. Per nursing tech and notes in chart, patient at times gets agitated and has been requiring prn ativan which was discontinued today by Psych. Feeding has also been an issue. In the setting of failure to thrive with dementia in addition to the poor baseline, goals of care were discussed with significant other who would like to wait to see if he recovers. Ethics was consulted and recommended NGT placement. If patient doesn't  safely tolerate NGT, then PEG would definitely not be an option. Neuro was consulted to assess if there are any other reversible causes for his encephalopathy and prognosis to aid in [place] discussion.   Per review of neurology note from [facility] dated 03/30/2022, patient was seen for stroke seen incidentally and stroke workup was recommended. The scan that showed stroke was done because patient was having trouble with his memory since 2 years and eventually lost his job. He also reported unintentional weight loss. MOCA at that time 7/30. Follow up was recommended but I do not see any follow up visits.  On 02/06/2022, he was seen by PCP for unintentional weight loss ( about 90lbs in 5 years, 40 lbs in 6 months). He had EGD, was diagnosed with esophageal candidiasis, treated with diflucan with subsequent stabilization of weight. CT C/A/P didn't show any acute abnormality.   H/o ALS in brother and sister  ROS: Unable to obtain due to altered mental status.   Past Medical History:  Diagnosis Date   Allergy    Arthritis    Bipolar disorder ([place])    Coronary artery disease    Dementia ([place])    Depression    Hyperlipidemia    Hypertension    Stroke [facility] [place])     Family History  Problem Relation Age of Onset   Hypertension Mother    Aneurysm Mother    Alcohol abuse Father    Hyperlipidemia Father    ALS Sister    Leukemia Sister    Hyperlipidemia Brother    ALS Brother    Colon cancer Neg Hx  Social History:  reports that he quit smoking about 12 years ago. His smoking use included cigarettes. He has never used smokeless tobacco. He reports that he does not currently use alcohol after a past usage of about 4.0 standard drinks of alcohol per week. He reports that he does not currently use drugs after having used the following drugs: Marijuana.   Medications Prior to Admission  Medication Sig Dispense Refill Last Dose   ascorbic acid (VITAMIN C) 100 MG tablet Take by mouth.   Past Week    aspirin EC 81 MG tablet Take 1 tablet (81 mg total) by mouth 2 (two) times daily. 84 tablet 0 Past Week   atorvastatin (LIPITOR) 10 MG tablet Take 10 mg by mouth daily.   Past Week   Cholecalciferol 50 MCG (2000 UT) TABS Take by mouth.   Past Week   fluticasone (FLONASE) 50 MCG/ACT nasal spray Place into the nose.   unknown   lithium carbonate 300 MG capsule Take 300 mg by mouth 2 (two) times daily with a meal.   Past Week   senna-docusate (SENOKOT S) 8.6-50 MG tablet Take 1 tablet by mouth at bedtime as needed. 30 tablet 1 unknown   traZODone (DESYREL) 100 MG tablet Take 100 mg by mouth at bedtime.   Past Week      Exam: Current vital signs: BP 92/72 (BP Location: Left Arm)   Pulse 87   Temp 97.6 F (36.4 C) (Axillary)   Resp 20   Ht 5\' 8"  (1.727 m)   Wt 59.4 kg   SpO2 97%   BMI 19.92 kg/m  Vital signs in last 24 hours: Temp:  [97.5 F (36.4 C)-97.6 F (36.4 C)] 97.6 F (36.4 C) (01/05 0435) Pulse Rate:  [87-88] 87 (01/05 0435) Resp:  [20] 20 (01/05 0435) BP: (91-92)/(72-75) 92/72 (01/05 0435) SpO2:  [97 %] 97 % (01/05 0435)   Physical Exam  Constitutional: laying in bed, frail looking Neuro: barely opens eyes but doesn't track examiner, no gaze deviation, pupils small and difficult to appreciate reactivity, doesn't follow commands, spontaneously moving all extremities   I have reviewed labs in epic and the results pertinent to this consultation are: CBC:  Recent Labs  Lab 09/17/22 0645  WBC 14.1*  HGB 14.0  HCT 45.2  MCV 79.2*  PLT 163    Basic Metabolic Panel:  Lab Results  Component Value Date   NA 138 09/17/2022   K 3.8 09/17/2022   CO2 22 09/17/2022   GLUCOSE 97 09/17/2022   BUN 10 09/17/2022   CREATININE 0.80 09/17/2022   CALCIUM 9.0 09/17/2022   GFRNONAA >60 09/17/2022   GFRAA 58 (L) 08/12/2018   Lipid Panel:  Lab Results  Component Value Date   LDLCALC 86 09/06/2022   HgbA1c:  Lab Results  Component Value Date   HGBA1C 5.8 (H) 09/06/2022    Urine Drug Screen:     Component Value Date/Time   LABOPIA NONE DETECTED 09/06/2022 0430   COCAINSCRNUR NONE DETECTED 09/06/2022 0430   LABBENZ NONE DETECTED 09/06/2022 0430   AMPHETMU NONE DETECTED 09/06/2022 0430   THCU POSITIVE (A) 09/06/2022 0430   LABBARB NONE DETECTED 09/06/2022 0430    Alcohol Level     Component Value Date/Time   ETH <10 09/05/2022 1344    I have reviewed the images obtained:  CT Head without contrast 09/05/2022: No evidence of acute intracranial abnormality. Remote appearing left frontal lobe infarct. MRI could provide more sensitive evaluation for acute infarct if  clinically warranted.  MRI Brain 09/06/2022:  No evidence of acute intracranial abnormality. Remote left frontal infarct. Cerebral Atrophy (ICD10-G31.9). T1 hypointense marrow in the visualized cervical spine. This finding is nonspecific, but can be seen with chronic anemia, chronic hypoxia (such as in smokers), obesity, and lymphoproliferative disorders.    ASSESSMENT/PLAN: 63yo M with multiple medical comorbidities as noted in HPI admitted for failure to thrive.  Acute encephalopathy Dysphagia Failure to thrive Bipolar disorder Dementia - Patient was unable to provide any history. I called his significant other who didn't pick up. - TSH was normal. No definite infection, MRI and presentation not consistent with CJD, unlikely vascular with negative MRI, no h/o trauma. 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 There are potentially nutritionally deficiencies that could contribute to his presentation.  Recommendations: - Previous workup by PCP was over 6 months ago. Therefore, will check B12, folate, Thiamine, Magnesium, Ammonia to look for reversible causes. - Although low suspicion, can consider repeating CT chest, abdo and pelvis and testicular US to rue out neoplastic process. Unlikely paraneoplastic with the  prolonged duration. However, even if we find a neoplasm, he will likely not be a candidate for treatment based on his current functional status. - Has two siblings with ALS. It is possible patient has bulbar onset ALS. However, will need EMG for definitive diagnosis which cannot be obtained at [facility]. Additionally, even if we do confirm its ALS, it is a progressive disease with no cure and at his stage, we may not be able to offer something with substantial effect on his quality of life. If he does improve and gets discharged, I would definitely recommend getting an EMG to evaluate for ALS - I agree with team to place dobhoff tube for nutrition. His QTC is prolonged so will defer to psych regarding medication to help with agitation so he doesn't pull it out - if workup for reversible causes is negative and patient doesn't improve, then I do not think neurology will have more to offer at this point.  - Appreciate palliative care and ethic team help with the care of this patient.  Thank you for allowing [place] to participate in the care of this patient. If you have any further questions, please contact  me or neurohospitalist.   [facility] neurohospitalist

## 2022-09-21 NOTE — Progress Notes (Signed)
Pt was sitting up in the bed then throwing his head back in the bed. Ativan 2 mg IV was given for agitation/anxiety.

## 2022-09-21 NOTE — Progress Notes (Signed)
PROGRESS NOTE    Patient: [name]                            PCP: [facility]                    DOB: [birth year]            DOA: 09/05/2022 WUJ:[id number]             DOS: 09/21/2022, 11:49 AM   LOS: 15 days   Date of Service: The patient was seen and examined on 09/21/2022  Subjective:   The patient was seen and examined this morning.  [name] staff at bedside seems to be reacting to one of the nursing staff which feeding Otherwise no changes      Brief Narrative:   [name] is a 62 yo male with PMH dementia, bipolar 1 disorder, HLD, HTN, CAD, CVA who presented with confusion and dysphagia. With further workup he was also found to be agitated/impulsive, and unable to be redirected.  He required Haldol and Ativan.  His significant other was concerned about his inability to swallow and he was brought for further workup. Because of the difficulty eating/dysphagia, he was initially initiated on a stroke workup.  However, further workup also was notable for an elevated lithium level.  Given his presenting symptoms, it was felt that they were attributed to lithium toxicity.  Poison control was called and patient was started on fluids and admitted. Despite aggressive measures, he showed little improvement during hospitalization.  Given his poor physical reserve and declining functional status leading up to hospitalization, he appeared to be more consistent with failure to thrive.  Goals of care discussions were held with his significant other who is his main caretaker and decision maker.  He was made DNR and continued on medical treatment in the hopes for some improvement however his prognosis was made clear that it appears to be worsening daily.     09/18/22 -Had face-to-face Conference with patient's girlfriend on 09/13/2022 -Patient girlfriend requested that I do Not call her to talk to her about patient's poor prognosis anymore, because it makes  her sad.- -Patient's girlfriend is accusing the medical team of "prematurely giving up on patient" --She tells me that she is not ready to make any decisions about possible de-escalation of care until the middle of January at the earliest -oral intake remains very poor -Hypoglycemia with blood sugar of 69 noted despite continuous IV dextrose drip-- -will increase IV dextrose rate -Overall prognosis remains poor given very poor oral intake due to persistent metabolic encephalopathy superimposed on advanced dementia  09/19/22 -Remain calm comfortable, remained calm, comfortable, sleepy -Refused to eat or to be fed  09/20/2022 -Agitated confused overnight, Ativan and Haldol was used overnight -Appreciate palliative care following, and consulted ethics committee Current recommendation is neuroconsultation, attempt of Dobbhoff placement and initiation of tube feeding Fails we will proceed with comfort care   09/21/2022 -Some, minimal interaction between him and the nursing staff noted this morning with some oral intake -Plan remains for Dobbhoff placement per ethics committee recommendations Nutrition to be consulted for tube feeds -Psych has been consulted for evaluation -Pending neuro eval  ------------------------------------------------------------------------------------------------------------------------------ Assessment and Plan: 1)Lithium Toxicity Resolved  - Lithium level 1.51 on admission -Etiology possibly due to chronic toxicity versus possible extra doses unintentionally (wife/partner not certain) - Patient presents with predominate neuro signs (agitation,  confusion, dysphagia) supporting chronic toxicity; also cardiac effect with prolonged Qtc -Poison center aware - Continue iv fluids until significant other can make further decisions regarding possible transition to comfort care - lithium level normalized and dropped - Caution use of Haldol for his agitation.  Try to use  Ativan more - appreciate tele-psych rec's; he has been taken off lithium and started on depakote; continue p.o. 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 -The last time he was able to put a couple words together in a phrase was apparently around June 2023 when he said that the garage where he used to do his Curator work was on [employer] -otherwise over the last 12 months patient may answer with one-word answers, significant cognitive decline -Patient lost over 40 pounds of weight due to poor oral intake     3)Goals of care, counseling/discussion - Long conversation held with [name] on 09/10/2022 and again on 09/13/2022, September 19, 2022  -Ongoing discussion daily with palliative care team  -Palliative care -continue on going discussion with patient's girlfriend [name] -No improvement-minimally interactive, impulsive,-continue to pull out IVs telemetry lines -As needed Ativan, Haldol -No nutritional intake -IV fluid limited as he is pulling the line out -Cognitively impaired -Palliative care team, and ethics committee consulted: Recommended: Attempt Dobbhoff tube and tube feeds if fails progress to comfort care Recommended Neuro consultation  -  patient has been hospitalized for days at that point and he has shown no signs of improvement.  He remains minimally interactive and impulsive, constantly trying to climb out of bed.  He has pulled IV out as well as telemetry off repetitively.   -He also has had extremely minimal nutritional intake since admission.    -He is not a good candidate for NG tube placement as he will inevitably pull this out as well and PEG tube placement also considered medically futile and inappropriate in context of his multiple comorbidities, poor quality of life, and progressive functional decline  - Discussed with Tyler Ober that he does not appear to have the physical reserve to overcome the events that have led up to this hospitalization.  He may also not survive this hospitalization.   Lack of nutrition will cause him to continue to rapidly decline which is also not unexpected  Tyler Ober agrees with DNR status at this time. She does wish for ongoing attempts/treatment to see if he improves, but should he continue to decline (which I expect), then a natural death will be allowed. He is currently not a rehab candidate unless were to show tremendous improvement and rather he appears more appropriate for inpatient vs residential hospice depending on course of events over the next few days -Overall prognosis remains poor/grave -Family  contact--   [name] [phone] -As per Ms [name] .... [name] does not have any living family members. His last close blood relative passed 3 months ago and he has no children   09/21/2022 -Some, minimal interaction between him and the nursing staff noted this morning with some oral intake -Plan remains for Dobbhoff placement per ethics committee recommendations Nutrition to be consulted for tube feeds -Psych has been consulted for evaluation -Pending neuro eval   4)Hypernatremia -Hypernatremia resolved with D5 water solution/hydration   5)Acute metabolic encephalopathy in  setting of baseline advanced/severe dementia with significant cognitive and memory deficits at baseline - Etiology attributed to probable lithium toxicity - CT head unremarkable for acute findings.  Chronic left frontal lobe infarct appreciated -MRI brain also negative for acute stroke -Patient is reported to have some altered mentation at baseline as well; per girlfriend,  he is mostly nonverbal at baseline----please see #2 above -Continue sitter -Continue assist of nursing staff   6)Dysphagia - Suspected due to lithium toxicity - did have recent EGD June 2023, cannot see results, but per GI had gastritis and Candida esophagitis - unfortunately no signs of improvement still - see GOC - evaluated by SLP - continue offering diet as able; poor NGT and PEG candidate; not appropriate for TPN -Tempting to place Dobbhoff tube for tube feeding   May be reactive  -Chest x-ray on 09/17/2022 without acute findings -Get blood cultures, check UA -Hold off on antibiotics   Prolonged QT interval - Likely related to elevated lithium level - patient pulling off tele too much for meaning capture; okay to d/c - serial EKGs as he allows too - Hold QT prolonging agents when possible   Hyperlipidemia - hold statin   Bipolar 1 disorder ([place]) - see lithium toxicity above - haldol / ativan PRN   Contamination of blood culture-resolved as of 09/09/2022 - 1/4 bottles noted with GPC from 12/20 but apparently nothing seen on media per micro. Has remained afebrile with minimal leukocytosis (considered reactive from other processes) - Will Repeat blood culture if becomes febrile    -------------------------------------------------------------------------------------------------------------------------------------  DVT prophylaxis:  heparin injection 5,000 Units Start: 09/05/22 2200 SCD's Start: 09/05/22 2038   Code Status:   Code Status: DNR  Family Communication: No family member present at  bedside- attempt will be made to update daily The above findings and plan of care has been discussed with patient (and family)  in detail,  they expressed understanding and agreement of above. -Advance care planning has been discussed.   Admission status:   Status is: Inpatient Remains inpatient appropriate because: Requiring IV fluid, nutrition,     Procedures:   No admission procedures for hospital encounter.   Antimicrobials:  Anti-infectives (From admission, onward)    Start     Dose/Rate Route Frequency Ordered Stop   09/06/22 0900  voriconazole (VFEND) tablet 200 mg  Status:  Discontinued        200 mg Oral  Once 09/05/22 1941 09/07/22 0815        Medication:   heparin  5,000 Units Subcutaneous Q8H   nystatin   Topical BID    acetaminophen **OR** acetaminophen (TYLENOL) oral liquid 160 mg/5 mL **OR** acetaminophen, benztropine **OR** benztropine mesylate, haloperidol lactate, LORazepam   Objective:   Vitals:   09/19/22 2116 09/20/22 0619 09/20/22 1950 09/21/22 0435  BP: 103/82 98/73 91/75  92/72  Pulse: 90 64 88 87  Resp: 20 16 20 20   Temp: 98 F (36.7 C) 98 F (  36.7 C) (!) 97.5 F (36.4 C) 97.6 F (36.4 C)  TempSrc:  Axillary  Axillary  SpO2: 91% 100% 97% 97%  Weight:      Height:        Intake/Output Summary (Last 24 hours) at 09/21/2022 1149 Last data filed at 09/21/2022 0500 Gross per 24 hour  Intake 868.45 ml  Output --  Net 868.45 ml   Filed Weights   09/05/22 1237  Weight: 59.4 kg     Examination:   Difficult changes with exception of being cooperative with one of the nurses this morning which is new.  Constitution: Lethargic, nonverbal, noncooperative, does not engage in conversation Psychiatric:   Not interactive-severely depressed mood noncommunicating HEENT:    Benign exam-more awake-no obvious abnormalities Chest:         Limited exam-chest symmetric Cardio vascular:  S1/S2, RRR, No murmure, No Rubs or Gallops  pulmonary: Clear  to auscultation bilaterally, respirations unlabored, negative wheezes / crackles Abdomen: Soft, non-tender, non-distended, bowel sounds,no masses, no organomegaly Muscular skeletal: Severely cachectic, muscle wasting, Limited exam -withdraws extremities to pain stimuli Neuro: Limited exam-lethargic  Extremities: No pitting edema lower extremities, +2 pulses  Skin: Dry, warm to touch, negative for any Rashes, No open wounds Wounds: per nursing documentation   ------------------------------------------------------------------------------------------------------------------------------------------    LABs:     Latest Ref Rng & Units 09/17/2022    6:45 AM 09/14/2022    5:39 AM 09/11/2022    4:55 AM  CBC  WBC 4.0 - 10.5 K/uL 14.1  4.4  7.3   Hemoglobin 13.0 - 17.0 g/dL 26.2  03.5  59.7   Hematocrit 39.0 - 52.0 % 45.2  44.6  44.2   Platelets 150 - 400 K/uL 163  175  193       Latest Ref Rng & Units 09/17/2022    6:45 AM 09/14/2022    5:39 AM 09/11/2022    4:55 AM  CMP  Glucose 70 - 99 mg/dL 97  98  93   BUN 8 - 23 mg/dL 10  8  14    Creatinine 0.61 - 1.24 mg/dL  4.16  3.84   Sodium 135 - 145 mmol/L 138  144  149   Potassium 3.5 - 5.1 mmol/L 3.8  3.5  3.5   Chloride 98 - 111 mmol/L 106  112  117   CO2 22 - 32 mmol/L 22  25  28    Calcium 8.9 - 10.3 mg/dL 9.0  9.1  9.2   Total Protein 6.5 - 8.1 g/dL  6.7  6.3   Total Bilirubin 0.3 - 1.2 mg/dL  0.5  0.6   Alkaline Phos 38 - 126 U/L  89  98   AST 15 - 41 U/L  32  34   ALT 0 - 44 U/L  34  27      Micro Results No results found for this or any previous visit (from the past 240 hour(s)).  Radiology Reports No results found.  SIGNED: 5.36, MD, FHM. [facility],  Pager (please use [email or web address] to page/text) Please use Epic Secure Chat for non-urgent communication (7AM-7PM)  If 7PM-7AM, please contact night-coverage [email or web address], 09/21/2022, 11:49 AM

## 2022-09-21 NOTE — Progress Notes (Signed)
At 2345 Pt was restless, agitated and attempting to climb out of the bed constantly,  Haldol 2 mg IV was given for agitation. At 0105 Pt is calm and sleeping well. No respiratory distress noted.

## 2022-09-21 NOTE — Progress Notes (Addendum)
Nutrition Follow up  DOCUMENTATION CODES:   Severe malnutrition in context of chronic illness  underweight  INTERVENTION:   Dysphagia 1 diet with nectar thick liquids  Add Magic cup with meals  Osmolite 1.2 @ 70 ml/hr per tube (on hold per MD)  NUTRITION DIAGNOSIS:   Severe Malnutrition related to chronic illness, acute illness (acute metabolic encephalopathy in setting of dementia, dysphagia) as evidenced by energy intake < or equal to 50% for > or equal to 5 days, severe fat depletion, severe muscle depletion, energy intake < 75% for > or equal to 1 month.  - ongoing   GOAL:  Patient will meet greater than or equal to 90% of their needs (if feasible given his confusion) - not met  MONITOR:  PO intake, Labs, TF tolerance, Weight trends  REASON FOR ASSESSMENT:   Consult Assessment of nutrition requirement/status (Small bore NGT placement)  ASSESSMENT: Patient is a 62 yo male with hx of dementia, HTN, CVA who presented  with confusion and dysphagia. Acute metabolic encephalopathy.   Reported poor oral intake since admission (0% of most meals 10% x 1 and 50% x 1). Per MD neurology consult, attempt Dobbhoff placement and start tube feeding if unable to place tube- transition to comfort focus.   Goals of care discussed with girlfriend per MD.   Talked with nursing. Radiology to place tube sometime today. Recommendations for formula and rate above.   Patient lunch tray is here untouched. Patient severely malnourished. PEG placement not recommended given his demented state.   1/9 Sitter is bedside and pt lunch tray is here. He drank 100% of nectar thick tea and ate 50% of vanilla pudding. No protein or vegetables. NT reports good breakfast intake. Tube feeding turned off this morning per report in order to encourage oral nutrition. Will add magic cup with meals to support. Recommend consider bowel regimen NO BM 3+ days.   Weights reviewed. Last weight 12/20-59.4 kg. Will  request new weight for assessment. Current weight obtained 49.5 kg which reflects severe loss of 10 kg (17%) < 1 month.   Medications: ativan 2 mg given 1/5 @ 0416  12/24- IVF -D5% @ 75 ml/hr      Latest Ref Rng & Units 09/25/2022    3:37 AM 09/24/2022    7:34 AM 09/23/2022    4:59 AM  BMP  Glucose 70 - 99 mg/dL 81  84  99   BUN 8 - 23 mg/dL 14  10  9    Creatinine 0.61 - 1.24 mg/dL 0.68  0.69  0.67   Sodium 135 - 145 mmol/L 136  134  136   Potassium 3.5 - 5.1 mmol/L 4.3  4.6  4.2   Chloride 98 - 111 mmol/L 100  100  101   CO2 22 - 32 mmol/L 27  25  26    Calcium 8.9 - 10.3 mg/dL 9.2  9.1  9.1       NUTRITION - FOCUSED PHYSICAL EXAM:  Flowsheet Row Most Recent Value  Orbital Region Mild depletion  Upper Arm Region Severe depletion  Thoracic and Lumbar Region Severe depletion  Buccal Region Severe depletion  Temple Region No depletion  Clavicle Bone Region Severe depletion  Clavicle and Acromion Bone Region Severe depletion  Scapular Bone Region Severe depletion  Dorsal Hand Unable to assess  [has green mitts on]  Patellar Region Severe depletion  Anterior Thigh Region Severe depletion  Posterior Calf Region Severe depletion  Edema (RD Assessment) None  Hair Unable  to assess  Eyes Unable to assess  Mouth Unable to assess  Skin Reviewed  Nails Unable to assess       Diet Order:   Diet Order             DIET DYS 2 Room service appropriate? Yes; Fluid consistency: Nectar Thick  Diet effective now                   EDUCATION NEEDS:  Not appropriate for education at this time  Skin:  Skin Assessment: Reviewed RN Assessment  Last BM:  1/1  Height:   Ht Readings from Last 1 Encounters:  09/05/22 5\' 8"  (1.727 m)    Weight:   Wt Readings from Last 1 Encounters:  09/24/22 49.5 kg    Ideal Body Weight:   70 kg  BMI:  Body mass index is 16.59 kg/m.  Estimated Nutritional Needs:   Kcal:  1900-2100  Protein:  100-105 gr  Fluid:  1800 ml daily  Colman Cater MS,RD,CSG,LDN Contact: Shea Evans

## 2022-09-21 NOTE — Progress Notes (Signed)
Patient primary nurse and aide in room attempting to reorient patient. Patient not responding coherently, thrashing about in the bed and non correctable in behavior. Patient's primary nurse provided prn orders as in the chart. This nurse contacted physician, Dr. Roger Shelter at that time, and sitter for bedside ordered as patient with undoubtedly pull out NG and IV otherwise.

## 2022-09-21 NOTE — Progress Notes (Signed)
EKG attempted, patient unable to be still for EKG and pulling off leads. Not sure if he understood request  "to not move."

## 2022-09-22 DIAGNOSIS — G9341 Metabolic encephalopathy: Secondary | ICD-10-CM | POA: Diagnosis not present

## 2022-09-22 LAB — CBC WITH DIFFERENTIAL/PLATELET
Abs Immature Granulocytes: 0.01 10*3/uL (ref 0.00–0.07)
Basophils Absolute: 0 10*3/uL (ref 0.0–0.1)
Basophils Relative: 1 %
Eosinophils Absolute: 0 10*3/uL (ref 0.0–0.5)
Eosinophils Relative: 1 %
HCT: 39.3 % (ref 39.0–52.0)
Hemoglobin: 12.2 g/dL — ABNORMAL LOW (ref 13.0–17.0)
Immature Granulocytes: 0 %
Lymphocytes Relative: 41 %
Lymphs Abs: 1.2 10*3/uL (ref 0.7–4.0)
MCH: 24.3 pg — ABNORMAL LOW (ref 26.0–34.0)
MCHC: 31 g/dL (ref 30.0–36.0)
MCV: 78.3 fL — ABNORMAL LOW (ref 80.0–100.0)
Monocytes Absolute: 0.5 10*3/uL (ref 0.1–1.0)
Monocytes Relative: 16 %
Neutro Abs: 1.2 10*3/uL — ABNORMAL LOW (ref 1.7–7.7)
Neutrophils Relative %: 41 %
Platelets: 177 10*3/uL (ref 150–400)
RBC: 5.02 MIL/uL (ref 4.22–5.81)
RDW: 15.8 % — ABNORMAL HIGH (ref 11.5–15.5)
WBC: 3 10*3/uL — ABNORMAL LOW (ref 4.0–10.5)
nRBC: 0 % (ref 0.0–0.2)

## 2022-09-22 LAB — TSH: TSH: 2.543 u[IU]/mL (ref 0.350–4.500)

## 2022-09-22 LAB — VITAMIN B12: Vitamin B-12: 1161 pg/mL — ABNORMAL HIGH (ref 180–914)

## 2022-09-22 LAB — GLUCOSE, CAPILLARY: Glucose-Capillary: 118 mg/dL — ABNORMAL HIGH (ref 70–99)

## 2022-09-22 LAB — FOLATE: Folate: 3.2 ng/mL — ABNORMAL LOW (ref >5.9)

## 2022-09-22 LAB — MAGNESIUM: Magnesium: 1.5 mg/dL — ABNORMAL LOW (ref 1.7–2.4)

## 2022-09-22 LAB — AMMONIA: Ammonia: 43 umol/L — ABNORMAL HIGH (ref 9–35)

## 2022-09-22 MED ORDER — BENZTROPINE MESYLATE 1 MG PO TABS
0.5000 mg | ORAL_TABLET | Freq: Two times a day (BID) | ORAL | Status: DC
  Administered 2022-09-22 – 2022-10-06 (×25): 0.5 mg via ORAL
  Filled 2022-09-22 (×26): qty 1

## 2022-09-22 MED ORDER — OSMOLITE 1.2 CAL PO LIQD
1000.0000 mL | ORAL | Status: DC
  Administered 2022-09-22 – 2022-09-25 (×3): 1000 mL
  Filled 2022-09-22 (×17): qty 1000

## 2022-09-22 MED ORDER — BENZTROPINE MESYLATE 1 MG/ML IJ SOLN
0.5000 mg | Freq: Two times a day (BID) | INTRAMUSCULAR | Status: DC
  Administered 2022-09-23 (×2): 0.5 mg via INTRAMUSCULAR
  Filled 2022-09-22 (×14): qty 0.5
  Filled 2022-09-22: qty 2
  Filled 2022-09-22 (×19): qty 0.5

## 2022-09-22 MED ORDER — MAGNESIUM SULFATE 2 GM/50ML IV SOLN
2.0000 g | Freq: Once | INTRAVENOUS | Status: AC
  Administered 2022-09-22: 2 g via INTRAVENOUS
  Filled 2022-09-22: qty 50

## 2022-09-22 NOTE — Progress Notes (Signed)
Tube feeds initiated this shift after review with provider, at this point pt tol well.  Pt unable to void earlier in shift, bladder scanned for >500, in and out performed, 800 ml out.  UA sent to lab.  Pt has since voided.  Lorazepam given as needed for pt restlessness, agitation.  Tylenol given x 1 for comfort.  Sitter at bedside, pt very restless at times, tubing getting caught on arms, legs, sitter able to keep pt safe in bed.  Oral cares provided, pt tolerated some thickened liquids and jello.

## 2022-09-22 NOTE — Progress Notes (Signed)
PROGRESS NOTE    Patient: [name]                            PCP: [facility]                    DOB: 1961/09/13            DOA: 09/05/2022 [id number]:[id number]             DOS: 09/22/2022, 11:43 AM   LOS: 16 days   Date of Service: The patient was seen and examined on 09/22/2022  Subjective:   The patient was seen and examined this morning. Per nursing staff was agitated overnight received Ativan around 3 AM Currently not interactive, not responsive  Brief Narrative:   Mr. Hoefling is a 62 yo male with PMH dementia, bipolar 1 disorder, HLD, HTN, CAD, CVA who presented with confusion and dysphagia. With further workup he was also found to be agitated/impulsive, and unable to be redirected.  He required Haldol and Ativan.  His significant other was concerned about his inability to swallow and he was brought for further workup. Because of the difficulty eating/dysphagia, he was initially initiated on a stroke workup.  However, further workup also was notable for an elevated lithium level.  Given his presenting symptoms, it was felt that they were attributed to lithium toxicity.  Poison control was called and patient was started on fluids and admitted. Despite aggressive measures, he showed little improvement during hospitalization.  Given his poor physical reserve and declining functional status leading up to hospitalization, he appeared to be more consistent with failure to thrive.  Goals of care discussions were held with his significant other who is his main caretaker and decision maker.  He was made DNR and continued on medical treatment in the hopes for some improvement however his prognosis was made clear that it appears to be worsening daily.     09/18/22 -Had face-to-face Conference with patient's girlfriend on 09/13/2022 -Patient girlfriend requested that I do Not call her to talk to her about patient's poor prognosis anymore, because it makes her  sad.- -Patient's girlfriend is accusing the medical team of "prematurely giving up on patient" --She tells me that she is not ready to make any decisions about possible de-escalation of care until the middle of January at the earliest -oral intake remains very poor -Hypoglycemia with blood sugar of 69 noted despite continuous IV dextrose drip-- -will increase IV dextrose rate -Overall prognosis remains poor given very poor oral intake due to persistent metabolic encephalopathy superimposed on advanced dementia  09/19/22 -Remain calm comfortable, remained calm, comfortable, sleepy -Refused to eat or to be fed  09/20/2022 -Agitated confused overnight, Ativan and Haldol was used overnight -Appreciate palliative care following, and consulted ethics committee Current recommendation is neuroconsultation, attempt of Dobbhoff placement and initiation of tube feeding Fails we will proceed with comfort care   09/21/2022 -Some, minimal interaction between him and the nursing staff noted this morning with some oral intake -Plan remains for Dobbhoff placement per ethics committee recommendations Nutrition to be consulted for tube feeds -Psych has been consulted for evaluation -Pending neuro eval  09/22/2022 -Patient received Dobbhoff feeding tube placement by IR on 09/21/2022 -Nutrition has started tube feeding -Neurology has evaluated the patient-looking for reversible causes -Agitated overnight received Ativan around 3 AM Unresponsive non-interactive this a.m.   ---------------------------------------------------------------------------------------------------------------------------------------- Assessment and Plan: 1)Lithium Toxicity Resolved  -  Lithium level 1.51 on admission -- lithium level normalized and dropped -No change in mentation,-not interactive, unresponsive -Etiology possibly due to chronic toxicity versus possible extra doses unintentionally  - Continue iv fluids -?   transition to comfort care -Status post psych/TTS evaluation - appreciate tele-psych rec's; he has been taken off lithium and started on depakote; continue p.o. for now and if necessary, we can transition to IV - [name]: Cachectic, unresponsive, not interactive, poor p.o. 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 -The last time he was able to put a couple words together in a phrase was apparently around June 2023 when he said that the garage where he used to do his Curator work was on [employer] -otherwise over the last 12 months patient may answer with one-word answers, significant cognitive decline -Patient lost over 40 pounds of weight due to poor oral intake     3)Goals of care, counseling/discussion - Long conversation held with [place] on 09/10/2022 and again on 09/13/2022, September 19, 2022  -Ongoing discussion daily with palliative care team -Ethic committee consulted, ongoing discussion For committee and Ms. Dossie Arbour -recommended trial of Dobbhoff feeding tube placement and tube feeding -    -No improvement-minimally interactive, impulsive,-continue to pull out IVs telemetry lines -As needed Ativan, Haldol -No PO nutritional intake -IV  fluid limited as he is pulling the line out  -Palliative care team, and ethics committee consulted: Recommended: Attempt Dobbhoff tube and tube feeds if fails progress to comfort care Recommended Neuro consultation  - patient has been hospitalized for days at that point and he has shown no signs of improvement.  He remains minimally interactive and impulsive, constantly trying to climb out of bed.  He has pulled IV out as well as telemetry off repetitively.   -He also has had extremely minimal nutritional intake since admission.    -WE do not find an to be a good candidate for G- tube placement as he will inevitably pull this out as well and PEG tube placement also considered medically futile and inappropriate in context of his multiple comorbidities, poor quality of life, and progressive functional decline  - Discussed with Sallye Ober that he does not appear to have the physical reserve to overcome the events that have led up to this hospitalization.  He may also not survive this hospitalization.     - [name] agrees with DNR status at this time. She does wish for ongoing attempts/treatment to see if he improves, but should he continue to decline (which I expect), then a natural death will be allowed. He is currently not a rehab candidate unless were to show tremendous improvement and rather he appears more appropriate for inpatient vs residential hospice depending on course of events over the next few days -Overall prognosis remains poor/grave -  Family contact--   Deneise Lever [phone] -As per [name] .... Angelica does not have any living family members. His last close blood relative passed 3 months ago and he has no children   09/21/2022 -Some, minimal interaction between him and the nursing staff noted this morning with some oral intake -Plan remains for Dobbhoff placement per ethics committee recommendations Nutrition to be consulted for tube feeds -Psych has been consulted for  evaluation -Pending neuro eval   September 22, 2022 -Patient received Dobbhoff feeding tube placement by IR on 09/21/2022 -Nutrition has started tube feeding -Neurology has evaluated the patient-looking for reversible causes -Agitated overnight received Ativan around 3 AM Unresponsive non-interactive this a.m.   4)Hypernatremia -Hypernatremia resolved with D5 water solution/hydration   5)Acute metabolic encephalopathy in setting of baseline advanced/severe dementia with significant cognitive and memory deficits at baseline - Etiology attributed to probable lithium toxicity - CT head unremarkable for acute findings.  Chronic left frontal lobe infarct appreciated -MRI brain also negative for acute stroke -Patient is reported to have some altered mentation at baseline as well; per girlfriend,  he is mostly nonverbal at baseline----please see #2 above -Continue sitter -Continue assist of nursing staff   6)Dysphagia -Multifactorial including lithium toxicity, psychiatric disorder refusing oral intake -Per ethic committee trial of Dobbhoff tube place and enteric feeding if it fails we will proceed with comfort care  - did have recent EGD June 2023, cannot see results, but per GI had gastritis and Candida esophagitis - unfortunately no signs of improvement still - see GOC - evaluated by SLP - continue offering diet as able; poor candidate for NGT and PEG candidate; not appropriate for TPN (No clear goals for long-term solution)    Leukocytosis May be reactive  -Chest x-ray on 09/17/2022 without acute findings -Get blood cultures, check UA -Hold off on antibiotics   Prolonged QT interval - Likely related to elevated lithium level - patient pulling off tele too much for meaning capture; okay to d/c - serial EKGs as he allows too - Hold QT prolonging agents when possible   Hyperlipidemia - hold statin   Bipolar 1 disorder (HCC) - see lithium toxicity above - haldol / ativan PRN    Contamination of blood culture-resolved as of 09/09/2022 - 1/4 bottles noted with GPC from 12/20 but apparently nothing seen on media per micro. Has remained afebrile with minimal leukocytosis (considered reactive from other processes) - Will Repeat blood culture if becomes febrile    -------------------------------------------------------------------------------------------------------------------------------------  DVT prophylaxis:  heparin injection 5,000 Units Start: 09/05/22 2200 SCD's Start: 09/05/22 2038   Code Status:   Code Status: DNR  Family Communication: No family member present at bedside- attempt will be made to update daily The above findings and plan of care has been discussed with patient (and family)  in detail,  they expressed understanding and agreement of above. -Advance care planning has been discussed.   Admission status:   Status is: Inpatient Remains inpatient appropriate because: Requiring IV fluid, nutrition,     Procedures:   No admission procedures for hospital encounter.   Antimicrobials:  Anti-infectives (From admission, onward)    Start     Dose/Rate Route Frequency Ordered Stop   09/06/22 0900  voriconazole (VFEND) tablet 200 mg  Status:  Discontinued        200 mg Oral  Once 09/05/22 1941 09/07/22 0815        Medication:   benztropine  0.5 mg Oral BID   Or  benztropine mesylate  0.5 mg Intramuscular BID   heparin  5,000 Units Subcutaneous Q8H   nystatin   Topical BID    acetaminophen **OR** acetaminophen (TYLENOL) oral liquid 160 mg/5 mL **OR** acetaminophen, haloperidol lactate, LORazepam   Objective:   Vitals:   09/21/22 1256 09/21/22 2100 09/22/22 0500 09/22/22 0535  BP: 96/70 106/65  109/69  Pulse: (!) 54 65  62  Resp: 16 16  16   Temp: 98.4 F (36.9 C) 97.6 F (36.4 C)  (!) 97.4 F (36.3 C)  TempSrc:    Oral  SpO2: 96% 100%  100%  Weight:   46.2 kg   Height:        Intake/Output Summary (Last 24 hours) at  09/22/2022 1143 Last data filed at 09/22/2022 0527 Gross per 24 hour  Intake 1799.4 ml  Output 800 ml  Net 999.4 ml   Filed Weights   09/05/22 1237 09/22/22 0500  Weight: 59.4 kg 46.2 kg     Examination:   NO significant changes with exception of being cooperative with one of the nurses this morning which is new.     Physical Exam:   General:  Lethargic, nonverbal, noncooperative, does not engage in conversation Severely cachectic male  HEENT:  Dobbhoff-feeding tube in place  normocephalic, PERRL, otherwise with in Normal limits   Neuro:  Limited exam, not interactive     Lungs:   Clear to auscultation BL, Respirations unlabored,  No wheezes / crackles  Cardio:    S1/S2, RRR, No murmure, No Rubs or Gallops   Abdomen:  Soft, non-tender, bowel sounds active all four quadrants, no guarding or peritoneal signs.  Muscular  skeletal:  Limited exam -global generalized weaknesses ,   2+ pulses,  symmetric, No pitting edema  Skin:  Dry, warm to touch, negative for any Rashes,  Wounds: Please see nursing documentation           --------------------------------------------------------------------------------------------------------------------    LABs:     Latest Ref Rng & Units 09/22/2022    8:37 AM 09/21/2022   12:43 PM 09/17/2022    6:45 AM  CBC  WBC 4.0 - 10.5 K/uL 3.0  3.3  14.1   Hemoglobin 13.0 - 17.0 g/dL 11/27/2022  84.5  36.4   Hematocrit 39.0 - 52.0 % 39.3  44.9  45.2   Platelets 150 - 400 K/uL 177  168  163       Latest Ref Rng & Units 09/21/2022   12:43 PM 09/17/2022    6:45 AM 09/14/2022    5:39 AM  CMP  Glucose 70 - 99 mg/dL 84  97  98   BUN 8 - 23 mg/dL 9  10  8    Creatinine 0.61 - 1.24 mg/dL 09/16/2022   3.21   Sodium 135 - 145 mmol/L 138  138  144   Potassium 3.5 - 5.1 mmol/L 3.6  3.8  3.5   Chloride 98 - 111 mmol/L 102  106  112   CO2 22 - 32 mmol/L 29  22  25    Calcium 8.9 - 10.3 mg/dL 9.6  9.0  9.1   Total Protein 6.5 - 8.1 g/dL   6.7   Total Bilirubin  0.3 - 1.2 mg/dL   0.5   Alkaline Phos 38 - 126 U/L   89   AST 15 - 41 U/L   32   ALT 0 - 44 U/L   34      Micro Results No results found for this  or any previous visit (from the past 240 hour(s)).  Radiology Reports DG [facility] W/Fl W/Rad  Result Date: 09/21/2022 CLINICAL DATA:  Required feeding tube for nutritional support, unable to eat EXAM: NASO G TUBE PLACEMENT WITH FL AND WITH RAD CONTRAST:  None FLUOROSCOPY: Fluoroscopy Time:  1 minute 6 seconds Radiation Exposure Index (if provided by the fluoroscopic device): 16.3 mGy Number of Acquired Spot Images: 2 COMPARISON:  None Available. FINDINGS: Patient lying on RIGHT side, procedure performed in this position. Under fluoroscopic guidance, I placed a kangaroo feeding tube through the LEFT nares, through the hypopharynx and esophagus into the stomach. I was unable to position the tip into the duodenum. Excess tubing placed in stomach with tip/weight of tube at the pylorus to allow for passage with patient lying in RIGHT lateral decubitus position. IMPRESSION: Feeding tube placed under fluoroscopy with the tip of the pylorus. Electronically Signed   By: Lavonia Dana M.D.   On: 09/21/2022 14:44    SIGNED: Deatra James, MD, FHM. [facility],  Pager (please use [email or web address] to page/text) Please use Epic Secure Chat for non-urgent communication (7AM-7PM)  If 7PM-7AM, please contact night-coverage [email or web address], 09/22/2022, 11:43 AM

## 2022-09-23 ENCOUNTER — Inpatient Hospital Stay (HOSPITAL_COMMUNITY): Payer: Medicare Other

## 2022-09-23 DIAGNOSIS — G9341 Metabolic encephalopathy: Secondary | ICD-10-CM | POA: Diagnosis not present

## 2022-09-23 LAB — COMPREHENSIVE METABOLIC PANEL
ALT: 22 U/L (ref 0–44)
AST: 26 U/L (ref 15–41)
Albumin: 2.9 g/dL — ABNORMAL LOW (ref 3.5–5.0)
Alkaline Phosphatase: 68 U/L (ref 38–126)
Anion gap: 9 (ref 5–15)
BUN: 9 mg/dL (ref 8–23)
CO2: 26 mmol/L (ref 22–32)
Calcium: 9.1 mg/dL (ref 8.9–10.3)
Chloride: 101 mmol/L (ref 98–111)
Creatinine, Ser: 0.67 mg/dL (ref 0.61–1.24)
GFR, Estimated: >60 mL/min (ref >60)
Glucose, Bld: 99 mg/dL (ref 70–99)
Potassium: 4.2 mmol/L (ref 3.5–5.1)
Sodium: 136 mmol/L (ref 135–145)
Total Bilirubin: 0.6 mg/dL (ref 0.3–1.2)
Total Protein: 6.6 g/dL (ref 6.5–8.1)

## 2022-09-23 LAB — MAGNESIUM: Magnesium: 1.8 mg/dL (ref 1.7–2.4)

## 2022-09-23 MED ORDER — ALBUTEROL SULFATE (2.5 MG/3ML) 0.083% IN NEBU
2.5000 mg | INHALATION_SOLUTION | RESPIRATORY_TRACT | Status: DC | PRN
  Administered 2022-09-23: 2.5 mg via RESPIRATORY_TRACT

## 2022-09-23 MED ORDER — ALBUTEROL SULFATE (2.5 MG/3ML) 0.083% IN NEBU
INHALATION_SOLUTION | RESPIRATORY_TRACT | Status: AC
  Filled 2022-09-23: qty 3

## 2022-09-23 MED ORDER — GUAIFENESIN 100 MG/5ML PO LIQD
5.0000 mL | ORAL | Status: DC | PRN
  Administered 2022-09-23: 5 mL via ORAL
  Filled 2022-09-23: qty 5

## 2022-09-23 MED ORDER — FOLIC ACID 1 MG PO TABS
1.0000 mg | ORAL_TABLET | Freq: Every day | ORAL | Status: DC
  Administered 2022-09-24 – 2022-10-27 (×32): 1 mg via ORAL
  Filled 2022-09-23 (×34): qty 1

## 2022-09-23 MED ORDER — GUAIFENESIN ER 600 MG PO TB12: 600.0000 mg | ORAL_TABLET | Freq: Two times a day (BID) | ORAL | Status: DC

## 2022-09-23 MED ORDER — ENOXAPARIN SODIUM 40 MG/0.4ML IJ SOSY
40.0000 mg | PREFILLED_SYRINGE | INTRAMUSCULAR | Status: DC
  Administered 2022-09-24 – 2022-10-27 (×31): 40 mg via SUBCUTANEOUS
  Filled 2022-09-23 (×35): qty 0.4

## 2022-09-23 MED ORDER — LACTULOSE ENEMA
300.0000 mL | Freq: Once | ORAL | Status: AC
  Administered 2022-09-23: 300 mL via RECTAL
  Filled 2022-09-23: qty 300

## 2022-09-23 NOTE — Progress Notes (Signed)
Tube feeding stopped on previous shift due to concerns for aspiration. Restarted per verbal order by Dr. Roger Shelter to this nurse. Restarted at last rate of 3mL/hr to continue to increase to goal. [facility] a least 30 degrees. Sitter remains at bedside.

## 2022-09-23 NOTE — Progress Notes (Signed)
Previous shift, pt very irritable when attempting heparin injections.  This shift, pt pushing away at this nurse when attempting to administer med.  Provider notified, hold doses for this shift, to be evaluated in am.  At this time, pt tolerating SCD placement. During assessment, noted pt hands to be cool to palp.  Pt still restless but decreased incidence of pulling at lines.  Sitter at bedside.  HOB low when initially assessed pt, raised and locked at 30 degrees.  However, with pt activity, he frequently scoots down in bed and has to be repositioned.    At [place], this nurse went to relieve sitter.  While sitting with pt and preparing tube feed change, noted that pt had congested cough.  No coughing previously noted. Oral cares and suctioning provided.  Cough persisted.  Tube feeds paused, reviewed with provider.  CXR ordered and completed.  Reviewed results with provider, will hold feeds tonight, prn neb tx and guaifenesin ordered.  Will continue to monitor.

## 2022-09-23 NOTE — Progress Notes (Signed)
PROGRESS NOTE    Patient: [name]                            PCP: [facility]                    DOB: [birth year]            DOA: 09/05/2022 [id number]:[id number]             DOS: 09/23/2022, 11:26 AM   LOS: 17 days   Date of Service: The patient was seen and examined on 09/23/2022  Subjective:   Patient was seen and examined, nursing staff present at bedside  Remains lethargic, somnolent , Cooperative  Brief Narrative:   [name] is a 62 yo [name] with PMH dementia, bipolar 1 disorder, HLD, HTN, CAD, CVA who presented with confusion and dysphagia. With further workup he was also found to be agitated/impulsive, and unable to be redirected.  He required Haldol and Ativan.  His significant other was concerned about his inability to swallow and he was brought for further workup. Because of the difficulty eating/dysphagia, he was initially initiated on a stroke workup.  However, further workup also was notable for an elevated lithium level.  Given his presenting symptoms, it was felt that they were attributed to lithium toxicity.  Poison control was called and patient was started on fluids and admitted. Despite aggressive measures, he showed little improvement during hospitalization.  Given his poor physical reserve and declining functional status leading up to hospitalization, he appeared to be more consistent with failure to thrive.  Goals of care discussions were held with his significant other who is his main caretaker and decision maker.  He was made DNR and continued on medical treatment in the hopes for some improvement however his prognosis was made clear that it appears to be worsening daily.     09/18/22 -Had face-to-face Conference with patient's girlfriend on 09/13/2022 -Patient girlfriend requested that I do Not call her to talk to her about patient's poor prognosis anymore, because it makes her sad.- -Patient's girlfriend is accusing the medical  team of "prematurely giving up on patient" --She tells me that she is not ready to make any decisions about possible de-escalation of care until the middle of January at the earliest -oral intake remains very poor -Hypoglycemia with blood sugar of 69 noted despite continuous IV dextrose drip-- -will increase IV dextrose rate -Overall prognosis remains poor given very poor oral intake due to persistent metabolic encephalopathy superimposed on advanced dementia  09/19/22 -Remain calm comfortable, remained calm, comfortable, sleepy -Refused to eat or to be fed  09/20/2022 -Agitated confused overnight, Ativan and Haldol was used overnight -Appreciate palliative care following, and consulted ethics committee Current recommendation is neuroconsultation, attempt of Dobbhoff placement and initiation of tube feeding Fails we will proceed with comfort care   09/21/2022 -Some, minimal interaction between him and the nursing staff noted this morning with some oral intake -Plan remains for Dobbhoff placement per ethics committee recommendations Nutrition to be consulted for tube feeds -Psych has been consulted for evaluation -Pending neuro eval  09/22/2022 -Patient received Dobbhoff feeding tube placement by IR on 09/21/2022 -Nutrition has started tube feeding -Neurology has evaluated the patient-looking for reversible causes -Agitated overnight received Ativan around 3 AM Unresponsive non-interactive this a.m.  09/23/2022 -Patient was seen and examined -Agitated earlier per nursing staff -Dobbhoff tube still in place,  tube feeds, ---------------------------------------------------------------------------------------------------------------------------------------- Assessment and Plan: 1)Lithium Toxicity Resolved  - Lithium level 1.51 on admission -- lithium level normalized and dropped -No change in mentation,-not interactive, unresponsive  -Status post psych/TTS evaluation - appreciate  tele-psych rec's; he has been taken off lithium and started on depakote; continue p.o. for now and if necessary,  transitioned to IV - [name]: Tyler, unresponsive, not interactive, poor p.o. 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 -The last time he was able to put a couple words together in a phrase was apparently around June 2023 when he said that the garage where he used to do his Curator work was on [employer] -otherwise over the last 12 months patient may answer with one-word answers, significant cognitive decline -Patient lost over 40 pounds of weight due to poor oral intake     3)Goals of care, counseling/discussion - Long conversation held with [place] on 09/10/2022 and again on 09/13/2022, September 19, 2022  -Ongoing discussion daily with palliative care team -Ethic committee consulted, ongoing discussion For committee and Ms. Dossie Arbour -recommended trial of Dobbhoff feeding tube placement and tube feeding -    -No improvement-minimally interactive, impulsive,-continue to pull out IVs telemetry lines -As needed Ativan, Haldol -No PO nutritional intake -IV fluid limited as he is pulling the line out  -Palliative care  team, and ethics committee consulted: Recommended: Attempt Dobbhoff tube and tube feeds if fails progress to comfort care Recommended Neuro consultation  - patient has been hospitalized for days at that point and he has shown no signs of improvement.  He remains minimally interactive and impulsive, constantly trying to climb out of bed.  He has pulled IV out as well as telemetry off repetitively.   -He also has had extremely minimal nutritional intake since admission.    -WE do not find an to be a good candidate for G- tube placement as he will inevitably pull this out as well and PEG tube placement also considered medically futile and inappropriate in context of his multiple comorbidities, poor quality of life, and progressive functional decline  - Discussed with Sallye Ober that he does not appear to have the physical reserve to overcome the events that have led up to this hospitalization.  He may also not survive this hospitalization.     - [name] agrees with DNR status at this time. She does wish for ongoing attempts/treatment to see if he improves, but should he continue to decline (which I expect), then a natural death will be allowed. He is currently not a rehab candidate unless were to show tremendous improvement and rather he appears more appropriate for inpatient vs residential hospice depending on course of events over the next few days -Overall prognosis remains poor/grave -Family contact--   Sallye Ober Scales [phone] -As per Ms  [name] Scales .... No living family members.    09/21/2022 -Some, minimal interaction between him and the nursing staff noted this morning with some oral intake -Plan remains for Dobbhoff placement per ethics committee recommendations Nutrition to be consulted for tube feeds -Psych has been consulted for evaluation -Pending neuro eval   1/6, 2024 -Patient received Dobbhoff feeding tube placement by IR on 09/21/2022 -Nutrition has started tube  feeding -Neurology has evaluated the patient-looking for reversible causes -Agitated overnight received Ativan around 3 AM Unresponsive non-interactive this a.m.  09/23/2022 -Patient was seen and examined -Agitated earlier per nursing staff -Dobbhoff tube still in place, tube feeds,  4)Hypernatremia -resolved   5)Acute metabolic encephalopathy in setting of baseline advanced/severe dementia with significant cognitive and memory deficits at baseline - Etiology attributed to probable lithium toxicity - CT head unremarkable for acute findings.  Chronic left frontal lobe infarct appreciated -MRI brain also negative for acute stroke -Patient is reported to have some altered mentation at baseline as well; per girlfriend,  he is mostly nonverbal at baseline----please see #2 above -Continue sitter -Continue assist of nursing staff   6)Dysphagia -Multifactorial including lithium toxicity, psychiatric disorder refusing oral intake -Per ethic committee trial of Dobbhoff tube place and enteric feeding if it fails we will proceed with comfort care  - did have recent EGD June 2023, cannot see results, but per GI had gastritis and Candida esophagitis - unfortunately no signs of improvement still - see GOC - evaluated by SLP - continue offering diet as able; poor candidate for NGT and PEG candidate; not appropriate for TPN (No clear goals for long-term solution)    Leukocytosis -likely reactive-resolved -Chest x-ray on 09/17/2022 without acute findings -Get blood cultures, check UA -Hold off on antibiotics   Prolonged QT interval - Likely related to elevated lithium level - patient pulling off tele too much for meaning capture; okay to d/c - serial EKGs as he allows too - Hold QT prolonging agents when possible   Hyperlipidemia -discontinued statin, risk outweighs the benefit for this patient   Bipolar 1 disorder (HCC) - Discontinued lithium, per psych recommendation on Cogentin, IV  Depakote - haldol / ativan PRN   Contamination of blood culture-resolved as of 09/09/2022 - 1/4 bottles noted with GPC from 12/20 but apparently nothing seen on media per micro. Has remained afebrile with minimal leukocytosis (considered reactive from other processes) - Will Repeat blood culture if becomes febrile    -------------------------------------------------------------------------------------------------------------------------------------  DVT prophylaxis:  enoxaparin (LOVENOX) injection 40 mg Start: 09/23/22 1000 SCD's Start: 09/05/22 2038   Code Status:   Code Status: DNR  Family Communication: No family member present at bedside- attempt will be made to update daily The above findings and plan of care has been discussed with patient (and family)  in detail,  they expressed understanding and agreement of above. -Advance care planning has been discussed.   Admission status:   Status is: Inpatient Remains inpatient appropriate because: Requiring IV fluid, nutrition,     Procedures:   No admission procedures for hospital encounter.   Antimicrobials:  Anti-infectives (From admission, onward)    Start     Dose/Rate Route Frequency Ordered Stop   09/06/22 0900  voriconazole (VFEND) tablet 200 mg  Status:  Discontinued        200 mg Oral  Once 09/05/22 1941 09/07/22 0815        Medication:   benztropine  0.5 mg Oral BID   Or   benztropine mesylate  0.5  mg Intramuscular BID   enoxaparin (LOVENOX) injection  40 mg Subcutaneous Q24H   nystatin   Topical BID    acetaminophen **OR** acetaminophen (TYLENOL) oral liquid 160 mg/5 mL **OR** acetaminophen, albuterol, guaiFENesin, haloperidol lactate, LORazepam   Objective:   Vitals:   09/23/22 0412 09/23/22 0414 09/23/22 0421 09/23/22 0441  BP: 96/71 102/74    Pulse: 77 72    Resp: 18     Temp: (!) 97.5 F (36.4 C)  97.8 F (36.6 C)   TempSrc: Oral  Axillary   SpO2: 96%     Weight:    49.5 kg  Height:         Intake/Output Summary (Last 24 hours) at 09/23/2022 1126 Last data filed at 09/23/2022 0433 Gross per 24 hour  Intake 2263.55 ml  Output --  Net 2263.55 ml   Filed Weights   09/05/22 1237 09/22/22 0500 09/23/22 0441  Weight: 59.4 kg 46.2 kg 49.5 kg     Examination:   NO significant changes with exception of being cooperative with one of the nurses this morning which is new.      General:  [name], nonverbal, noncommunicative, encephalopathic  HEENT:  Dobbhoff, tube feed in place --- Limited exam uncooperative  Neuro:  Limited exam encephalopathic CNII-XII intact. , normal motor and sensation, reflexes intact   Lungs:   Clear to auscultation BL, Respirations unlabored,  No wheezes / crackles  Cardio:    S1/S2, RRR, No murmure, No Rubs or Gallops   Abdomen:  Soft, non-tender, bowel sounds active all four quadrants, no guarding or peritoneal signs.  Muscular  skeletal:  Limited exam -severe cachexia, generalized muscle wasting - in bed,   2+ pulses,  symmetric, No pitting edema  Skin:  Dry, warm to touch, negative for any Rashes,  Wounds: Please see nursing documentation          --------------------------------------------------------------------------------------------------------------------    LABs:     Latest Ref Rng & Units 09/22/2022    8:37 AM 09/21/2022   12:43 PM 09/17/2022    6:45 AM  CBC  WBC 4.0 - 10.5 K/uL 3.0  3.3  14.1   Hemoglobin 13.0 - 17.0 g/dL 53.6  64.4  03.4   Hematocrit 39.0 - 52.0 % 39.3  44.9  45.2   Platelets 150 - 400 K/uL 177  168  163       Latest Ref Rng & Units 09/23/2022    4:59 AM 09/21/2022   12:43 PM 09/17/2022    6:45 AM  CMP  Glucose 70 - 99 mg/dL 99  84  97   BUN 8 - 23 mg/dL 9  9  10    Creatinine 0.61 - 1.24 mg/dL  7.42  5.95   Sodium 135 - 145 mmol/L 136  138  138   Potassium 3.5 - 5.1 mmol/L 4.2  3.6  3.8   Chloride 98 - 111 mmol/L 101  102  106   CO2 22 - 32 mmol/L 26  29  22    Calcium 8.9 - 10.3 mg/dL  9.1  9.6  9.0   Total Protein 6.5 - 8.1 g/dL 6.6     Total Bilirubin 0.3 - 1.2 mg/dL 0.6     Alkaline Phos 38 - 126 U/L 68     AST 15 - 41 U/L 26     ALT 0 - 44 U/L 22        Micro Results No results found for this or any previous visit (from  the past 240 hour(s)).  Radiology Reports DG CHEST PORT 1 VIEW  Result Date: 09/23/2022 CLINICAL DATA:  Cough EXAM: PORTABLE CHEST 1 VIEW COMPARISON:  None Available. FINDINGS: Stable right basilar scarring and pleural thickening. Unchanged mild right-sided volume loss. Lungs are otherwise clear. No pneumothorax or pleural effusion. Median sternotomy has been performed. Cardiac size within normal limits. Nasoenteric feeding tube extends into the upper abdomen beyond the margin of the examination. Pulmonary vascularity is normal. No acute bone abnormality. IMPRESSION: 1. No active disease. 2. Stable right basilar scarring and pleural thickening. Electronically Signed   By: Fidela Salisbury M.D.   On: 09/23/2022 02:10    SIGNED: Deatra James, MD, FHM. [facility],  Pager (please use [email or web address] to page/text) Please use Epic Secure Chat for non-urgent communication (7AM-7PM)  If 7PM-7AM, please contact night-coverage [email or web address], 09/23/2022, 11:26 AM

## 2022-09-23 NOTE — Progress Notes (Signed)
ANTICOAGULATION CONSULT NOTE - Initial Consult  Pharmacy Consult for Lovenox Indication: VTE prophylaxis  No Known Allergies  Patient Measurements: Height: 5\' 8"  (172.7 cm) Weight: 49.5 kg (109 lb 2 oz) IBW/kg (Calculated) : 68.4  Vital Signs: Temp: 97.8 F (36.6 C) (01/07 0421) Temp Source: Axillary (01/07 0421) BP: 102/74 (01/07 0414) Pulse Rate: 72 (01/07 0414)  Labs: Recent Labs    09/21/22 1243 09/22/22 0837 09/23/22 0459  HGB 13.5 12.2*  --   HCT 44.9 39.3  --   PLT 168 177  --   CREATININE 0.73  --  0.67    Estimated Creatinine Clearance: 67 mL/min (by C-G formula based on SCr of 0.67 mg/dL).   Medical History: Past Medical History:  Diagnosis Date   Allergy    Arthritis    Bipolar disorder ([place])    Coronary artery disease    Dementia ([place])    Depression    Hyperlipidemia    Hypertension    Stroke [facility])     Assessment: Per nursing:  Previous shift, pt very irritable when attempting heparin injections. This shift, pt pushing away at this nurse when attempting to administer med. Provider notified, hold doses for this shift, to be evaluated in am. At this time, pt tolerating SCD placement.  Received order for pharmacy consult for Lovenox for VTE px  Goal of Therapy:  VTE px Monitor platelets by anticoagulation protocol: Yes   Plan:  Lovenox 40mg  SQ daily Monitor for adverse effects  Hart Robinsons A 09/23/2022,8:05 AM

## 2022-09-24 DIAGNOSIS — G9341 Metabolic encephalopathy: Secondary | ICD-10-CM | POA: Diagnosis not present

## 2022-09-24 LAB — AMMONIA: Ammonia: 37 umol/L — ABNORMAL HIGH (ref 9–35)

## 2022-09-24 LAB — COMPREHENSIVE METABOLIC PANEL
ALT: 20 U/L (ref 0–44)
AST: 25 U/L (ref 15–41)
Albumin: 2.9 g/dL — ABNORMAL LOW (ref 3.5–5.0)
Alkaline Phosphatase: 68 U/L (ref 38–126)
Anion gap: 9 (ref 5–15)
BUN: 10 mg/dL (ref 8–23)
CO2: 25 mmol/L (ref 22–32)
Calcium: 9.1 mg/dL (ref 8.9–10.3)
Chloride: 100 mmol/L (ref 98–111)
Creatinine, Ser: 0.69 mg/dL (ref 0.61–1.24)
GFR, Estimated: >60 mL/min (ref >60)
Glucose, Bld: 84 mg/dL (ref 70–99)
Potassium: 4.6 mmol/L (ref 3.5–5.1)
Sodium: 134 mmol/L — ABNORMAL LOW (ref 135–145)
Total Bilirubin: 0.2 mg/dL — ABNORMAL LOW (ref 0.3–1.2)
Total Protein: 6.6 g/dL (ref 6.5–8.1)

## 2022-09-24 LAB — GLUCOSE, CAPILLARY
Glucose-Capillary: 91 mg/dL (ref 70–99)
Glucose-Capillary: 114 mg/dL — ABNORMAL HIGH (ref 70–99)

## 2022-09-24 MED ORDER — AMPHETAMINE-DEXTROAMPHETAMINE 10 MG PO TABS
10.0000 mg | ORAL_TABLET | Freq: Every day | ORAL | Status: DC
  Administered 2022-09-24 – 2022-10-06 (×12): 10 mg via ORAL
  Filled 2022-09-24 (×12): qty 1

## 2022-09-24 NOTE — Progress Notes (Signed)
PROGRESS NOTE    Patient: [name]                            PCP: [facility]                    DOB: 1961-03-31            DOA: 09/05/2022 PJK:[id number]             DOS: 09/24/2022, 1:07 PM   LOS: 18 days   Date of Service: The patient was seen and examined on 09/24/2022  Subjective:   The patient was seen and examined this morning, more interactive this morning with the nursing staff Nonverbal, nonambulatory..  No major changes Mittens and Dobbhoff present via nasal cannula Per nursing staff has tolerated tube feeds  Nursing staff at bedside attempting to encourage oral intake  Brief Narrative:   [name] is a 62 yo male with PMH dementia, bipolar 1 disorder, HLD, HTN, CAD, CVA who presented with confusion and dysphagia. With further workup he was also found to be agitated/impulsive, and unable to be redirected.  He required Haldol and Ativan.  His significant other was concerned about his inability to swallow and he was brought for further workup. Because of the difficulty eating/dysphagia, he was initially initiated on a stroke workup.  However, further workup also was notable for an elevated lithium level.  Given his presenting symptoms, it was felt that they were attributed to lithium toxicity.  Poison control was called and patient was started on fluids and admitted. Despite aggressive measures, he showed little improvement during hospitalization.  Given his poor physical reserve and declining functional status leading up to hospitalization, he appeared to be more consistent with failure to thrive.  Goals of care discussions were held with his significant other who is his main caretaker and decision maker.  He was made DNR and continued on medical treatment in the hopes for some improvement however his prognosis was made clear that it appears to be worsening daily.     09/18/22 -Had face-to-face Conference with patient's girlfriend on  09/13/2022 -Patient girlfriend requested that I do Not call her to talk to her about patient's poor prognosis anymore, because it makes her sad.- -Patient's girlfriend is accusing the medical team of "prematurely giving up on patient" --She tells me that she is not ready to make any decisions about possible de-escalation of care until the middle of January at the earliest -oral intake remains very poor -Hypoglycemia with blood sugar of 69 noted despite continuous IV dextrose drip-- -will increase IV dextrose rate -Overall prognosis remains poor given very poor oral intake due to persistent metabolic encephalopathy superimposed on advanced dementia  09/19/22 -Remain calm comfortable, remained calm, comfortable, sleepy -Refused to eat or to be fed  09/20/2022 -Agitated confused overnight, Ativan and Haldol was used overnight -Appreciate palliative care following, and consulted ethics committee Current recommendation is neuroconsultation, attempt of Dobbhoff placement and initiation of tube feeding Fails we will proceed with comfort care   09/21/2022 -Some, minimal interaction between him and the nursing staff noted this morning with some oral intake -Plan remains for Dobbhoff placement per ethics committee recommendations Nutrition to be consulted for tube feeds -Psych has been consulted for evaluation -Pending neuro eval  09/22/2022 -Patient received Dobbhoff feeding tube placement by IR on 09/21/2022 -Nutrition has started tube feeding -Neurology has evaluated the patient-looking for  reversible causes -Agitated overnight received Ativan around 3 AM Unresponsive non-interactive this a.m.  09/23/2022 -Patient was seen and examined -Agitated earlier per nursing staff -Dobbhoff tube still in place, tube feeds,   09/24/2022 -A bit more responsive to the nursing staff at bedside was encouraging oral intake -Drop-offs still present -tolerating tube  feeds   ---------------------------------------------------------------------------------------------------------------------------------------- Assessment and Plan: 1)Lithium Toxicity Resolved  - Lithium level 1.51 on admission -- lithium level normalized and dropped -No change in mentation,-not interactive, unresponsive  -Status post psych/TTS evaluation - appreciate tele-psych rec's; he has been taken off lithium and started on depakote; continue p.o. for now and if necessary,  transitioned to IV  - [name]: Cachectic, unresponsive, not interactive, poor p.o. 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 Verner Chol -recommended trial of Dobbhoff feeding tube placement and tube feeding -    -No improvement-minimally interactive, impulsive,-continue to pull out IVs telemetry lines -As needed Ativan, Haldol -No PO nutritional intake -IV fluid limited as he is pulling the line out  -Palliative care team, and ethics committee consulted: Recommended: Attempt Dobbhoff tube and tube feeds if fails progress to comfort care Recommended Neuro consultation  - patient has been hospitalized for days at that point and he has shown no signs of improvement.  He remains minimally interactive and impulsive, constantly trying to climb out of bed.  He has pulled IV out as well as telemetry off repetitively.   -He also has had extremely minimal nutritional intake since admission.    -WE do not find an to be a good candidate for G- tube placement as he will inevitably pull this out as well and PEG tube placement also considered medically futile and inappropriate in context of his multiple comorbidities, poor quality of life, and progressive functional decline  - Discussed with [name] that he does not appear to have the physical reserve to overcome the events that have led up to this hospitalization.  He may also not survive this hospitalization.     - [name] agrees with DNR status at this time. She does wish for ongoing attempts/treatment to see if he improves, but should he continue to decline (which I  expect), then a natural death will be allowed. He is currently not a rehab candidate unless were to show tremendous improvement and rather he appears more appropriate for inpatient vs residential hospice depending on course of events over the next few days -Overall prognosis remains poor/grave -Family contact--   Deneise Lever [phone] -As per Tyler Sallye Ober Scales .... No living family members.    09/21/2022 -Some, minimal  interaction between him and the nursing staff noted this morning with some oral intake -Plan remains for Dobbhoff placement per ethics committee recommendations Nutrition to be consulted for tube feeds -Psych has been consulted for evaluation -Pending neuro eval   1/6, 2024 -Patient received Dobbhoff feeding tube placement by IR on 09/21/2022 -Nutrition has started tube feeding -Neurology has evaluated the patient-looking for reversible causes -Agitated overnight received Ativan around 3 AM Unresponsive non-interactive this a.m.  09/23/2022 -Patient was seen and examined -Agitated earlier per nursing staff -Dobbhoff tube still in place, tube feeds,  09/24/2022 -A bit more responsive to the nursing staff at bedside was encouraging oral intake -Drop-offs still present -tolerating tube feeds    4)Hypernatremia -resolved   5)Acute metabolic encephalopathy in setting of baseline advanced/severe dementia with significant cognitive and memory deficits at baseline - Etiology attributed to probable lithium toxicity - CT head unremarkable for acute findings.  Chronic left frontal lobe infarct appreciated -MRI brain also negative for acute stroke -Patient is reported to have some altered mentation at baseline as well; per girlfriend,  he is mostly nonverbal at baseline----please see #2 above -Continue sitter -Continue assist of nursing staff   6)Dysphagia -Multifactorial including lithium toxicity, psychiatric disorder refusing oral intake -Per ethic committee trial of Dobbhoff tube place and enteric feeding if it fails we will proceed with comfort care  - did have recent EGD June 2023, cannot see results, but per GI had gastritis and Candida esophagitis - unfortunately no signs of improvement still - see GOC - evaluated by SLP - continue offering diet as able; poor candidate for NGT and PEG candidate; not appropriate for TPN (No clear goals for long-term solution)    Leukocytosis  -likely reactive-resolved -Chest x-ray on 09/17/2022 without acute findings -Get blood cultures, check UA -Hold off on antibiotics   Prolonged QT interval - Likely related to elevated lithium level - patient pulling off tele too much for meaning capture; okay to d/c - serial EKGs as he allows too - Hold QT prolonging agents when possible   Hyperlipidemia -discontinued statin, risk outweighs the benefit for this patient   Bipolar 1 disorder (HCC) - Discontinued lithium, per psych recommendation on Cogentin, IV Depakote - haldol / ativan PRN   Contamination of blood culture-resolved as of 09/09/2022 - 1/4 bottles noted with GPC from 12/20 but apparently nothing seen on media per micro. Has remained afebrile with minimal leukocytosis (considered reactive from other processes) - Will Repeat blood culture if becomes febrile    -------------------------------------------------------------------------------------------------------------------------------------  DVT prophylaxis:  enoxaparin (LOVENOX) injection 40 mg Start: 09/23/22 1000 SCD's Start: 09/05/22 2038   Code Status:   Code Status: DNR  Family Communication: No family member present at bedside- attempt will be made to update daily The above findings and plan of care has been discussed with patient (and family)  in detail,  they expressed understanding and agreement of above. -Advance care planning has been discussed.   Admission status:   Status is: Inpatient Remains inpatient appropriate because: Requiring IV fluid, nutrition,     Procedures:   No admission  procedures for hospital encounter.   Antimicrobials:  Anti-infectives (From admission, onward)    Start     Dose/Rate Route Frequency Ordered Stop   09/06/22 0900  voriconazole (VFEND) tablet 200 mg  Status:  Discontinued        200 mg Oral  Once 09/05/22 1941 09/07/22 0815        Medication:   benztropine  0.5 mg Oral BID   Or   benztropine  mesylate  0.5 mg Intramuscular BID   enoxaparin (LOVENOX) injection  40 mg Subcutaneous Q24H   folic acid  1 mg Oral Daily   nystatin   Topical BID    acetaminophen **OR** acetaminophen (TYLENOL) oral liquid 160 mg/5 mL **OR** acetaminophen, albuterol, guaiFENesin, haloperidol lactate, LORazepam   Objective:   Vitals:   09/23/22 0441 09/24/22 0421 09/24/22 0427 09/24/22 0842  BP:  109/69  113/68  Pulse:  79  (!) 57  Resp:  18  17  Temp:  97.7 F (36.5 C)  (!) 97.5 F (36.4 C)  TempSrc:  Axillary  Axillary  SpO2:  97%  100%  Weight: 49.5 kg  49.5 kg   Height:        Intake/Output Summary (Last 24 hours) at 09/24/2022 1307 Last data filed at 09/24/2022 1010 Gross per 24 hour  Intake 901.58 ml  Output 1600 ml  Net -698.42 ml   Filed Weights   09/22/22 0500 09/23/22 0441 09/24/22 0427  Weight: 46.2 kg 49.5 kg 49.5 kg     Examination:    General:  A bit more responsive with nursing staff this a.m., otherwise severely ill male,  with a chronic cachexia -muscle wasting  HEENT:  Dobbhoff tube feed at nares  Limited exam -  normocephalic, PERRL,   Neuro:  CNII-XII intact. , normal motor and sensation, reflexes intact   Lungs:   Clear to auscultation BL, Respirations unlabored,  No wheezes / crackles  Cardio:    S1/S2, RRR, No murmure, No Rubs or Gallops   Abdomen:  Soft, non-tender, bowel sounds active all four quadrants, no guarding or peritoneal signs.  Muscular  skeletal:  Limited exam -- in bed,   2+ pulses,  symmetric, No pitting edema  Skin:  Dry, warm to touch, negative for any Rashes,  Wounds: Please see nursing documentation             --------------------------------------------------------------------------------------------------------------------    LABs:     Latest Ref Rng & Units 09/22/2022    8:37 AM 09/21/2022   12:43 PM 09/17/2022    6:45 AM  CBC  WBC 4.0 - 10.5 K/uL 3.0  3.3  14.1   Hemoglobin 13.0 - 17.0 g/dL 38.7  56.4  33.2   Hematocrit  39.0 - 52.0 % 39.3  44.9  45.2   Platelets 150 - 400 K/uL 177  168  163       Latest Ref Rng & Units 09/24/2022    7:34 AM 09/23/2022    4:59 AM 09/21/2022   12:43 PM  CMP  Glucose 70 - 99 mg/dL 84  99  84   BUN 8 - 23 mg/dL 10  9  9    Creatinine 0.61 - 1.24 mg/dL  9.51  8.84   Sodium 135 - 145 mmol/L 134  136  138   Potassium 3.5 - 5.1 mmol/L 4.6  4.2  3.6   Chloride 98 - 111 mmol/L 100  101  102   CO2 22 - 32 mmol/L 25  26  29   Calcium 8.9 - 10.3 mg/dL 9.1  9.1  9.6   Total Protein 6.5 - 8.1 g/dL 6.6  6.6    Total Bilirubin 0.3 - 1.2 mg/dL 0.2  0.6    Alkaline Phos 38 - 126 U/L 68  68    AST 15 - 41 U/L 25  26    ALT 0 - 44 U/L 20  22       Micro Results No results found for this or any previous visit (from the past 240 hour(s)).  Radiology Reports No results found.  SIGNED: Kendell Bane, MD, FHM. [facility],  Pager (please use [email or web address] to page/text) Please use Epic Secure Chat for non-urgent communication (7AM-7PM)  If 7PM-7AM, please contact night-coverage [email or web address], 09/24/2022, 1:07 PM

## 2022-09-24 NOTE — Progress Notes (Signed)
Patient tolerating TF increased by 68ml per MD order at a rate of 77ml/hr at this time. Patient OOB elevated greater than 30 degrees, patient still with mittens in place d/t pulling at things and restless, sitter at bedside.

## 2022-09-25 DIAGNOSIS — R627 Adult failure to thrive: Secondary | ICD-10-CM | POA: Diagnosis not present

## 2022-09-25 DIAGNOSIS — Z7189 Other specified counseling: Secondary | ICD-10-CM | POA: Diagnosis not present

## 2022-09-25 DIAGNOSIS — G9341 Metabolic encephalopathy: Secondary | ICD-10-CM | POA: Diagnosis not present

## 2022-09-25 DIAGNOSIS — Z515 Encounter for palliative care: Secondary | ICD-10-CM | POA: Diagnosis not present

## 2022-09-25 LAB — COMPREHENSIVE METABOLIC PANEL
ALT: 63 U/L — ABNORMAL HIGH (ref 0–44)
AST: 76 U/L — ABNORMAL HIGH (ref 15–41)
Albumin: 3 g/dL — ABNORMAL LOW (ref 3.5–5.0)
Alkaline Phosphatase: 74 U/L (ref 38–126)
Anion gap: 9 (ref 5–15)
BUN: 14 mg/dL (ref 8–23)
CO2: 27 mmol/L (ref 22–32)
Calcium: 9.2 mg/dL (ref 8.9–10.3)
Chloride: 100 mmol/L (ref 98–111)
Creatinine, Ser: 0.68 mg/dL (ref 0.61–1.24)
GFR, Estimated: >60 mL/min (ref >60)
Glucose, Bld: 81 mg/dL (ref 70–99)
Potassium: 4.3 mmol/L (ref 3.5–5.1)
Sodium: 136 mmol/L (ref 135–145)
Total Bilirubin: 0.3 mg/dL (ref 0.3–1.2)
Total Protein: 6.9 g/dL (ref 6.5–8.1)

## 2022-09-25 LAB — GLUCOSE, CAPILLARY
Glucose-Capillary: 91 mg/dL (ref 70–99)
Glucose-Capillary: 116 mg/dL — ABNORMAL HIGH (ref 70–99)

## 2022-09-25 LAB — AMMONIA: Ammonia: 49 umol/L — ABNORMAL HIGH (ref 9–35)

## 2022-09-25 MED ORDER — LORAZEPAM 2 MG/ML IJ SOLN
2.0000 mg | Freq: Three times a day (TID) | INTRAMUSCULAR | Status: DC | PRN
  Administered 2022-09-26 – 2022-10-20 (×33): 2 mg via INTRAVENOUS
  Filled 2022-09-25 (×38): qty 1
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
## 2022-09-25 NOTE — Progress Notes (Addendum)
Nutrition Follow up  DOCUMENTATION CODES:   Severe malnutrition in context of chronic illness  underweight  INTERVENTION:   Dysphagia 2 diet with nectar thick liquids  Add Magic cup with meals  Osmolite 1.2 @ 70 ml/hr per tube (on hold per MD)  NUTRITION DIAGNOSIS:   Severe Malnutrition related to chronic illness, acute illness (acute metabolic encephalopathy in setting of dementia, dysphagia) as evidenced by energy intake < or equal to 50% for > or equal to 5 days, severe fat depletion, severe muscle depletion, energy intake < 75% for > or equal to 1 month.  - ongoing   GOAL:  Patient will meet greater than or equal to 90% of their needs (if feasible given his confusion) - not met  MONITOR:  PO intake, Labs, TF tolerance, Weight trends  Initial Consult for Assessment of nutrition requirement/status (Small bore NGT placement)  ASSESSMENT: Patient is a 62 yo male with hx of dementia, HTN, CVA who presented  with confusion and dysphagia. Acute metabolic encephalopathy.   Reported poor oral intake since admission (0% of most meals 10% x 1 and 50% x 1). Per MD neurology consult, attempt Dobbhoff placement and start tube feeding if unable to place tube- transition to comfort focus.   Goals of care discussed with girlfriend per MD.   Talked with nursing. Radiology to place tube sometime today. Recommendations for formula and rate above.   Patient lunch tray is here untouched. Patient severely malnourished. PEG placement not recommended given his demented state.   1/9 Sitter is bedside and pt lunch tray is here. He drank 100% of nectar thick tea and ate 50% of vanilla pudding. No protein or vegetables. NT reports good breakfast intake. Tube feeding turned off this morning per report in order to encourage oral nutrition. Will add magic cup with meals to support. Recommend consider bowel regimen NO BM 3+ days.   Weights reviewed. Last weight 12/20-59.4 kg. Will request new weight  for assessment. Current weight obtained 49.5 kg which reflects severe loss of 10 kg (17%) < 1 month.   Medications: ativan 2 mg given 1/5 @ 0416  12/24- IVF -D5% @ 75 ml/hr      Latest Ref Rng & Units 09/25/2022    3:37 AM 09/24/2022    7:34 AM 09/23/2022    4:59 AM  BMP  Glucose 70 - 99 mg/dL 81  84  99   BUN 8 - 23 mg/dL 14  10  9    Creatinine 0.61 - 1.24 mg/dL  7.56  4.33   Sodium 135 - 145 mmol/L 136  134  136   Potassium 3.5 - 5.1 mmol/L 4.3  4.6  4.2   Chloride 98 - 111 mmol/L 100  100  101   CO2 22 - 32 mmol/L 27  25  26    Calcium 8.9 - 10.3 mg/dL 9.2  9.1  9.1       NUTRITION - FOCUSED PHYSICAL EXAM:  Flowsheet Row Most Recent Value  Orbital Region Mild depletion  Upper Arm Region Severe depletion  Thoracic and Lumbar Region Severe depletion  Buccal Region Severe depletion  Temple Region No depletion  Clavicle Bone Region Severe depletion  Clavicle and Acromion Bone Region Severe depletion  Scapular Bone Region Severe depletion  Dorsal Hand Unable to assess  [has green mitts on]  Patellar Region Severe depletion  Anterior Thigh Region Severe depletion  Posterior Calf Region Severe depletion  Edema (RD Assessment) None  Hair Unable to assess  Eyes Unable to assess  Mouth Unable to assess  Skin Reviewed  Nails Unable to assess       Diet Order:   Diet Order             DIET DYS 2 Room service appropriate? Yes; Fluid consistency: Nectar Thick  Diet effective now                   EDUCATION NEEDS:  Not appropriate for education at this time  Skin:  Skin Assessment: Reviewed RN Assessment  Last BM:  1/1  Height:   Ht Readings from Last 1 Encounters:  09/05/22 5\' 8"  (1.727 m)    Weight:   Wt Readings from Last 1 Encounters:  09/24/22 49.5 kg    Ideal Body Weight:   70 kg  BMI:  Body mass index is 16.59 kg/m.  Estimated Nutritional Needs:   Kcal:  1900-2100  Protein:  100-105 gr  Fluid:  1800 ml daily  Colman Cater  MS,RD,CSG,LDN Contact: Shea Evans

## 2022-09-25 NOTE — Progress Notes (Signed)
Patient pulled out NG-TUBE, Dr Roger Shelter notified. No orders received at this time. Plan of care on going.

## 2022-09-25 NOTE — Progress Notes (Signed)
PROGRESS NOTE    Patient: [name]                            PCP: [facility]                    DOB: 05/02/1961            DOA: 09/05/2022 GQQ:[id number]             DOS: 09/25/2022, 12:40 PM   LOS: 19 days   Date of Service: The patient was seen and examined on 09/25/2022  Subjective:   The patient was seen and examined this morning.  Bit more interactive opens his eyes, still not communicating, nonverbal Per nursing staff at bedside had a good p.o. intake this Am  after many days now  Was agitated earlier this morning.  Brief Narrative:   [name] is a 62 yo male with PMH dementia, bipolar 1 disorder, HLD, HTN, CAD, CVA who presented with confusion and dysphagia. With further workup he was also found to be agitated/impulsive, and unable to be redirected.  He required Haldol and Ativan.  His significant other was concerned about his inability to swallow and he was brought for further workup. Because of the difficulty eating/dysphagia, he was initially initiated on a stroke workup.  However, further workup also was notable for an elevated lithium level.  Given his presenting symptoms, it was felt that they were attributed to lithium toxicity.  Poison control was called and patient was started on fluids and admitted. Despite aggressive measures, he showed little improvement during hospitalization.  Given his poor physical reserve and declining functional status leading up to hospitalization, he appeared to be more consistent with failure to thrive.  Goals of care discussions were held with his significant other who is his main caretaker and decision maker.  He was made DNR and continued on medical treatment in the hopes for some improvement however his prognosis was made clear that it appears to be worsening daily.     09/18/22 -Had face-to-face Conference with patient's girlfriend on 09/13/2022 -Patient girlfriend requested that I do Not call  her to talk to her about patient's poor prognosis anymore, because it makes her sad.- -Patient's girlfriend is accusing the medical team of "prematurely giving up on patient" --She tells me that she is not ready to make any decisions about possible de-escalation of care until the middle of January at the earliest -oral intake remains very poor -Hypoglycemia with blood sugar of 69 noted despite continuous IV dextrose drip-- -will increase IV dextrose rate -Overall prognosis remains poor given very poor oral intake due to persistent metabolic encephalopathy superimposed on advanced dementia  09/19/22 -Remain calm comfortable, remained calm, comfortable, sleepy -Refused to eat or to be fed  09/20/2022 -Agitated confused overnight, Ativan and Haldol was used overnight -Appreciate palliative care following, and consulted ethics committee Current recommendation is neuroconsultation, attempt of Dobbhoff placement and initiation of tube feeding Fails we will proceed with comfort care   09/21/2022 -Some, minimal interaction between him and the nursing staff noted this morning with some oral intake -Plan remains for Dobbhoff placement per ethics committee recommendations Nutrition to be consulted for tube feeds -Psych has been consulted for evaluation -Pending neuro eval  09/22/2022 -Patient received Dobbhoff feeding tube placement by IR on 09/21/2022 -Nutrition has started tube feeding -Neurology has evaluated the patient-looking for reversible causes -Agitated overnight  received Ativan around 3 AM Unresponsive non-interactive this a.m.  09/23/2022 -Patient was seen and examined -Agitated earlier per nursing staff -Dobbhoff tube still in place, tube feeds,   09/24/2022 -A bit more responsive to the nursing staff at bedside was encouraging oral intake -Drop-offs still present -tolerating tube feeds   09/25/2022 -Opens his eyes but does not follow command, nonverbal, noncommunicating -Per  nursing staff agitated earlier this morning, has had p.o. intake for first time today -Dobbhoff feeding tube still in place   -------------------------------------------------------------------------------------------------------------------------------- Assessment and Plan: 1)Lithium Toxicity Resolved  - Lithium level 1.51 on admission -- lithium level normalized and dropped -No change in mentation,-not interactive, unresponsive  -Status post psych/TTS evaluation - appreciate tele-psych rec's; he has been taken off lithium and started on depakote; continue p.o. for now and if necessary,  transitioned to IV  - [name]: Cachectic, unresponsive, not interactive, poor p.o. 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 -The last time he was able to put a couple words together in a phrase was apparently around June 2023 when he said that the garage where he used to do his Dealer work was on [address] -otherwise over the last 12 months patient may answer with one-word answers, significant cognitive decline -Patient lost over 40 pounds of weight due to poor oral intake     3)Goals of care, counseling/discussion - Long conversation  held with [place] on 09/10/2022 and again on 09/13/2022, September 19, 2022  -Ongoing discussion daily with palliative care team -Ethic committee consulted, ongoing discussion For committee and Ms. Verner Chol -recommended trial of Dobbhoff feeding tube placement and tube feeding -    -No improvement-minimally interactive, impulsive,-continue to pull out IVs telemetry lines -As needed Ativan, Haldol -No PO nutritional intake -IV fluid limited as he is pulling the line out  -Palliative care team, and ethics committee consulted: Recommended: Attempt Dobbhoff tube and tube feeds if fails progress to comfort care Recommended Neuro consultation  - patient has been hospitalized for days at that point and he has shown no signs of improvement.  He remains minimally interactive and impulsive, constantly trying to climb out of bed.  He has pulled IV out as well as telemetry off repetitively.   -He also has had extremely minimal nutritional intake since admission.    -WE do not find an to be a good candidate for G- tube placement as he will inevitably pull this out as well and PEG tube placement also considered medically futile and inappropriate in context of his multiple comorbidities, poor quality of life, and progressive functional decline  - Discussed with Tyler Share that he does not appear to have the physical reserve to overcome the events that have led up to this hospitalization.  He may also not survive this hospitalization.     -  [name] agrees with DNR status at this time. She does wish for ongoing attempts/treatment to see if he improves, but should he continue to decline (which I expect), then a natural death will be allowed. He is currently not a rehab candidate unless were to show tremendous improvement and rather he appears more appropriate for inpatient vs residential hospice depending on course of events over the next few days -Overall prognosis remains poor/grave -Family contact--   Deneise Lever  [phone] -As per Ms Sallye Ober [name] .... No living family members.    09/21/2022 -Some, minimal interaction between him and the nursing staff noted this morning with some oral intake -Plan remains for Dobbhoff placement per ethics committee recommendations Nutrition to be consulted for tube feeds -Psych has been consulted for evaluation -Pending neuro eval   1/6, 2024 -Patient received Dobbhoff feeding tube placement by IR on 09/21/2022 -Nutrition has started tube feeding -Neurology has evaluated the patient-looking for reversible causes -Agitated overnight received Ativan around 3 AM Unresponsive non-interactive this a.m.  09/23/2022 -Patient was seen and examined -Agitated earlier per nursing staff -Dobbhoff tube still in place, tube feeds,  09/24/2022 -A bit more responsive to the nursing staff at bedside was encouraging oral intake -Drop-offs still present -tolerating tube feeds  09/25/2022 -Opens his eyes but does not follow command, nonverbal, noncommunicating -Per nursing staff agitated earlier this morning, has had p.o. intake for first time today -Dobbhoff feeding tube still in place.     4)Hypernatremia -resolved   5)Acute metabolic encephalopathy in setting of baseline advanced/severe dementia with significant cognitive and memory deficits at baseline - Etiology attributed to probable lithium toxicity - CT head unremarkable for acute findings.  Chronic left frontal lobe infarct appreciated -MRI brain also negative for acute stroke -Patient is reported to have some altered mentation at baseline as well; per girlfriend,  he is mostly nonverbal at baseline----please see #2 above -Continue sitter -Continue assist of nursing staff   6)Dysphagia -Multifactorial including lithium toxicity, psychiatric disorder refusing oral intake -First oral intake 09/25/2022  -Per ethic committee trial of Dobbhoff tube place and enteric feeding if it fails we will proceed with comfort  care  - Recent EGD June 2023, cannot see results, but per GI had gastritis and Candida esophagitis - unfortunately no signs of improvement still - see GOC - evaluated by SLP - continue offering diet as able; poor candidate for NGT and PEG candidate; not appropriate for TPN (No clear goals for long-term solution)    Leukocytosis -likely reactive-resolved -Chest x-ray on 09/17/2022 without acute findings -Get blood cultures, check UA -Hold off on antibiotics   Prolonged QT interval - Likely related to elevated lithium level - patient pulling off tele too much for meaning capture; okay to d/c - serial EKGs as he allows too - Hold QT prolonging agents when possible   Hyperlipidemia -discontinued statin, risk outweighs the benefit for this patient   Bipolar 1 disorder (HCC) - Discontinued lithium, per psych recommendation on Cogentin, IV Depakote - haldol / ativan PRN   Contamination of blood culture-resolved as of 09/09/2022 - 1/4 bottles noted with GPC from 12/20 but apparently nothing seen on media per micro. Has remained afebrile with minimal leukocytosis (considered reactive from other processes) - Will Repeat blood culture if becomes febrile    -------------------------------------------------------------------------------------------------------------------------------------  DVT prophylaxis:  enoxaparin (LOVENOX) injection 40 mg Start: 09/23/22 1000 SCD's Start: 09/05/22 2038   Code Status:   Code Status: DNR  Family Communication: No family member present at bedside-  attempt will be made to update daily The above findings and plan of care has been discussed with patient (and family)  in detail,  they expressed understanding and agreement of above. -Advance care planning has been discussed.   Admission status:   Status is: Inpatient Remains inpatient appropriate because: Requiring IV fluid, nutrition,     Procedures:   No admission procedures for hospital  encounter.   Antimicrobials:  Anti-infectives (From admission, onward)    Start     Dose/Rate Route Frequency Ordered Stop   09/06/22 0900  voriconazole (VFEND) tablet 200 mg  Status:  Discontinued        200 mg Oral  Once 09/05/22 1941 09/07/22 0815        Medication:   amphetamine-dextroamphetamine  10 mg Oral Q breakfast   benztropine  0.5 mg Oral BID   Or   benztropine mesylate  0.5 mg Intramuscular BID   enoxaparin (LOVENOX) injection  40 mg Subcutaneous Q24H   folic acid  1 mg Oral Daily   nystatin   Topical BID    acetaminophen **OR** acetaminophen (TYLENOL) oral liquid 160 mg/5 mL **OR** acetaminophen, albuterol, guaiFENesin, haloperidol lactate, LORazepam   Objective:   Vitals:   09/24/22 2127 09/25/22 0513 09/25/22 1027 09/25/22 1200  BP:  101/70 111/60   Pulse: 61 64 (!) 49   Resp: 18  20   Temp:  97.6 F (36.4 C) 98.6 F (37 C)   TempSrc:  Axillary Axillary   SpO2: 93% 100%  100%  Weight:      Height:        Intake/Output Summary (Last 24 hours) at 09/25/2022 1240 Last data filed at 09/25/2022 0913 Gross per 24 hour  Intake 120 ml  Output 600 ml  Net -480 ml   Filed Weights   09/22/22 0500 09/23/22 0441 09/24/22 0427  Weight: 46.2 kg 49.5 kg 49.5 kg     Examination:   The patient was seen and examined this morning, bit more interactive still nonverbal, per nursing staff at bedside was agitated earlier this morning General:  A bit more responsive with nursing staff this a.m., otherwise severely ill male,  with a chronic cachexia -muscle wasting  HEENT:  Dobbhoff tube feed at nares  Limited exam -  normocephalic, PERRL,   Neuro:  CNII-XII intact. , normal motor and sensation, reflexes intact   Lungs:   Clear to auscultation BL, Respirations unlabored,  No wheezes / crackles  Cardio:    S1/S2, RRR, No murmure, No Rubs or Gallops   Abdomen:  Soft, non-tender, bowel sounds active all four quadrants, no guarding or peritoneal signs.  Muscular   skeletal:  Limited exam -- in bed,   2+ pulses,  symmetric, No pitting edema  Skin:  Dry, warm to touch, negative for any Rashes,  Wounds: Please see nursing documentation             --------------------------------------------------------------------------------------------------------------------    LABs:     Latest Ref Rng & Units 09/22/2022    8:37 AM 09/21/2022   12:43 PM 09/17/2022    6:45 AM  CBC  WBC 4.0 - 10.5 K/uL 3.0  3.3  14.1   Hemoglobin 13.0 - 17.0 g/dL 09.3  81.8  29.9   Hematocrit 39.0 - 52.0 % 39.3  44.9  45.2   Platelets 150 - 400 K/uL 177  168  163       Latest Ref Rng & Units 09/25/2022    3:37 AM 09/24/2022  7:34 AM 09/23/2022    4:59 AM  CMP  Glucose 70 - 99 mg/dL 81  84  99   BUN 8 - 23 mg/dL 14  10  9    Creatinine 0.61 - 1.24 mg/dL  3.00  9.23   Sodium 135 - 145 mmol/L 136  134  136   Potassium 3.5 - 5.1 mmol/L 4.3  4.6  4.2   Chloride 98 - 111 mmol/L 100  100  101   CO2 22 - 32 mmol/L 27  25  26    Calcium 8.9 - 10.3 mg/dL 9.2  9.1  9.1   Total Protein 6.5 - 8.1 g/dL 6.9  6.6  6.6   Total Bilirubin 0.3 - 1.2 mg/dL 0.3  0.2  0.6   Alkaline Phos 38 - 126 U/L 74  68  68   AST 15 - 41 U/L 76  25  26   ALT 0 - 44 U/L 63  20  22      Micro Results No results found for this or any previous visit (from the past 240 hour(s)).  Radiology Reports No results found.  SIGNED: 3.00, MD, FHM. [facility],  Pager (please use [email or web address] to page/text) Please use Epic Secure Chat for non-urgent communication (7AM-7PM)  If 7PM-7AM, please contact night-coverage [email or web address], 09/25/2022, 12:40 PM

## 2022-09-26 DIAGNOSIS — G9341 Metabolic encephalopathy: Secondary | ICD-10-CM | POA: Diagnosis not present

## 2022-09-26 DIAGNOSIS — Z7189 Other specified counseling: Secondary | ICD-10-CM | POA: Diagnosis not present

## 2022-09-26 DIAGNOSIS — R627 Adult failure to thrive: Secondary | ICD-10-CM | POA: Diagnosis not present

## 2022-09-26 DIAGNOSIS — Z515 Encounter for palliative care: Secondary | ICD-10-CM | POA: Diagnosis not present

## 2022-09-26 LAB — GLUCOSE, CAPILLARY: Glucose-Capillary: 106 mg/dL — ABNORMAL HIGH (ref 70–99)

## 2022-09-26 LAB — COMPREHENSIVE METABOLIC PANEL
ALT: 82 U/L — ABNORMAL HIGH (ref 0–44)
AST: 73 U/L — ABNORMAL HIGH (ref 15–41)
Albumin: 3.1 g/dL — ABNORMAL LOW (ref 3.5–5.0)
Alkaline Phosphatase: 75 U/L (ref 38–126)
Anion gap: 13 (ref 5–15)
BUN: 15 mg/dL (ref 8–23)
CO2: 24 mmol/L (ref 22–32)
Calcium: 9.3 mg/dL (ref 8.9–10.3)
Chloride: 98 mmol/L (ref 98–111)
Creatinine, Ser: 0.81 mg/dL (ref 0.61–1.24)
GFR, Estimated: >60 mL/min (ref >60)
Glucose, Bld: 86 mg/dL (ref 70–99)
Potassium: 4.3 mmol/L (ref 3.5–5.1)
Sodium: 135 mmol/L (ref 135–145)
Total Bilirubin: <0.1 mg/dL — ABNORMAL LOW (ref 0.3–1.2)
Total Protein: 7.3 g/dL (ref 6.5–8.1)

## 2022-09-26 LAB — MAGNESIUM: Magnesium: 1.9 mg/dL (ref 1.7–2.4)

## 2022-09-26 MED ORDER — DEXTROSE-NACL 5-0.9 % IV SOLN: INTRAVENOUS | Status: DC

## 2022-09-26 MED ORDER — LACTULOSE 10 GM/15ML PO SOLN
30.0000 g | Freq: Once | ORAL | Status: AC
  Administered 2022-09-26: 30 g via ORAL
  Filled 2022-09-26: qty 60

## 2022-09-26 MED ORDER — MEGESTROL ACETATE 400 MG/10ML PO SUSP
200.0000 mg | Freq: Two times a day (BID) | ORAL | Status: DC
  Administered 2022-09-26 – 2022-10-27 (×62): 200 mg via ORAL
  Filled 2022-09-26 (×63): qty 10

## 2022-09-26 NOTE — Progress Notes (Signed)
PROGRESS NOTE    [name]  YKD:[id number] DOB: Sep 13, 1961 DOA: 09/05/2022 PCP: [facility]   Brief Narrative:    [name] is a 62 yo male with PMH dementia, bipolar 1 disorder, HLD, HTN, CAD, CVA who presented with confusion and dysphagia. With further workup he was also found to be agitated/impulsive, and unable to be redirected.  He required Haldol and Ativan.  His significant other was concerned about his inability to swallow and he was brought for further workup. Because of the difficulty eating/dysphagia, he was initially initiated on a stroke workup.  However, further workup also was notable for an elevated lithium level.  Given his presenting symptoms, it was felt that they were attributed to lithium toxicity.  Poison control was called and patient was started on fluids and admitted. Despite aggressive measures, he showed little improvement during hospitalization.  Given his poor physical reserve and declining functional status leading up to hospitalization, he appeared to be more consistent with failure to thrive.  Goals of care discussions were held with his significant other who is his main caretaker and decision maker.  He was made DNR and continued on medical treatment in the hopes for some improvement however his prognosis was made clear that it appears to be worsening daily.  Palliative continues to have ongoing discussions with the girlfriend and ethics is involved.    09/18/22 -Had face-to-face Conference with patient's girlfriend on 09/13/2022 -Patient girlfriend requested that I do Not call her to talk to her about patient's poor prognosis anymore, because it makes her sad.- -Patient's girlfriend is accusing the medical team of "prematurely giving up on patient" --She tells me that she is not ready to make any decisions about possible de-escalation of care until the middle of January at the earliest -oral intake remains very  poor -Hypoglycemia with blood sugar of 69 noted despite continuous IV dextrose drip-- -will increase IV dextrose rate -Overall prognosis remains poor given very poor oral intake due to persistent metabolic encephalopathy superimposed on advanced dementia   09/19/22 -Remain calm comfortable, remained calm, comfortable, sleepy -Refused to eat or to be fed   09/20/2022 -Agitated confused overnight, Ativan and Haldol was used overnight -Appreciate palliative care following, and consulted ethics committee Current recommendation is neuroconsultation, attempt of Dobbhoff placement and initiation of tube feeding Fails we will proceed with comfort care     09/21/2022 -Some, minimal interaction between him and the nursing staff noted this morning with some oral intake -Plan remains for Dobbhoff placement per ethics committee recommendations Nutrition to be consulted for tube feeds -Psych has been consulted for evaluation -Pending neuro eval   09/22/2022 -Patient received Dobbhoff feeding tube placement by IR on 09/21/2022 -Nutrition has started tube feeding -Neurology has evaluated the patient-looking for reversible causes -Agitated overnight received Ativan around 3 AM Unresponsive non-interactive this a.m.   09/23/2022 -Patient was seen and examined -Agitated earlier per nursing staff -Dobbhoff tube still in place, tube feeds,     09/24/2022 -A bit more responsive to the nursing staff at bedside was encouraging oral intake -Drop-offs still present -tolerating tube feeds     09/25/2022 -Opens his eyes but does not follow command, nonverbal, noncommunicating -Per nursing staff agitated earlier this morning, has had p.o. intake for first time today -Dobbhoff feeding tube still in place   09/26/22 -Patient is nonverbal and appears to have pulled out feeding tube on 1/9 -Appears to tolerating some oral intake -Trial of lactulose today due  to elevated ammonia levels -Depakote recently increased  by behavioral health -Trial of Megace to see if this helps to improve appetite -Continue folate repletion -Appears to be having more of a failure to thrive picture and is appropriate for hospice  Assessment & Plan:   Principal Problem:   Acute metabolic encephalopathy Active Problems:   Bipolar 1 disorder (HCC)   Dysphagia   Lithium toxicity   Goals of care, counseling/discussion   Severe/Advanced Dementia with behavioral disturbance (HCC)   FTT (failure to thrive) in adult   Hypernatremia   Hyperlipidemia   Prolonged QT interval   Protein-calorie malnutrition, severe  Assessment and Plan:   1)Lithium Toxicity Resolved   - Lithium level 1.51 on admission -- lithium level normalized and dropped -No change in mentation,-not interactive, unresponsive   -Status post psych/TTS evaluation - appreciate tele-psych rec's; he has been taken off lithium and started on depakote; continue p.o. for now and if necessary,  transitioned to IV   - [name]: Cachectic, unresponsive, not interactive, poor p.o. 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 Dossie Arbour -recommended trial of Dobbhoff feeding tube placement and tube feeding -      -No improvement-minimally interactive, impulsive,-continue to pull out IVs telemetry lines -As needed Ativan, Haldol -No PO nutritional intake -IV fluid limited as he is pulling the line out   -Palliative care team, and ethics committee consulted: Recommended: Attempt Dobbhoff tube and tube feeds if fails progress to comfort care Recommended Neuro consultation   - patient has been hospitalized for days at that point and he has shown no signs of improvement.  He remains minimally interactive and impulsive, constantly trying to climb out of bed.  He has pulled IV out as well as telemetry off repetitively.   -He also has had extremely minimal nutritional intake since admission.    -WE do not find an to be a good candidate for G- tube placement as he will inevitably pull this out as well and PEG tube placement also considered medically futile and inappropriate in context of his multiple comorbidities, poor quality of life, and progressive functional decline   - Discussed  with [name] that he does not appear to have the physical reserve to overcome the events that have led up to this hospitalization.  He may also not survive this hospitalization.       - [name] agrees with DNR status at this time. She does wish for ongoing attempts/treatment to see if he improves, but should he  continue to decline (which I expect), then a natural death will be allowed. He is currently not a rehab candidate unless were to show tremendous improvement and rather he appears more appropriate for inpatient vs residential hospice depending on course of events over the next few days -Overall prognosis remains poor/grave -Family contact--   Deneise Lever [phone] -As per [name] .... No living family members.        4)Hypernatremia -resolved   5)Acute metabolic encephalopathy in setting of baseline advanced/severe dementia with significant cognitive and memory deficits at baseline - Etiology attributed to probable lithium toxicity - CT head unremarkable for acute findings.  Chronic left frontal lobe infarct appreciated -MRI brain also negative for acute stroke -Patient is reported to have some altered mentation at baseline as well; per girlfriend,  he is mostly nonverbal at baseline----please see #2 above -Continue sitter -Continue assist of nursing staff -Trial of lactulose for elevated ammonia level -Continue folate repletion -Depakote as adjusted per behavioral health   6)Dysphagia -Multifactorial including lithium toxicity, psychiatric disorder refusing oral intake -First oral intake 09/25/2022   -Per ethic committee trial of Dobbhoff tube place and enteric feeding if it fails we will proceed with comfort care   - Recent EGD June 2023, cannot see results, but per GI had gastritis and Candida esophagitis - unfortunately no signs of improvement still - see GOC - evaluated by SLP - continue offering diet as able; poor candidate for NGT and PEG candidate; not appropriate for TPN (No clear goals for long-term solution) -Pulled out Dobbhoff tube     Leukocytosis -likely reactive-resolved -Chest x-ray on 09/17/2022 without acute findings -Get blood cultures, check UA -Hold off on antibiotics   Prolonged QT interval - Likely related to elevated lithium level -  patient pulling off tele too much for meaning capture; okay to d/c - serial EKGs as he allows too - Hold QT prolonging agents when possible   Hyperlipidemia -discontinued statin, risk outweighs the benefit for this patient   Bipolar 1 disorder (HCC) - Discontinued lithium, per psych recommendation on Cogentin, IV Depakote - haldol / ativan PRN   Contamination of blood culture-resolved as of 09/09/2022 - 1/4 bottles noted with GPC from 12/20 but apparently nothing seen on media per micro. Has remained afebrile with minimal leukocytosis (considered reactive from other processes) - Will Repeat blood culture if becomes febrile      DVT prophylaxis:Lovenox Code Status: Full Family Communication: Tried calling significant other with no response Disposition Plan:  Status is: Inpatient Remains inpatient appropriate because: Need for ongoing close monitoring and IV medications.   Consultants:  Palliative Neurology Behavioral health Ethics  Procedures:  None  Antimicrobials:  Anti-infectives (From admission, onward)    Start     Dose/Rate Route Frequency Ordered Stop   09/06/22 0900  voriconazole (VFEND) tablet 200 mg  Status:  Discontinued        200 mg Oral  Once 09/05/22 1941 09/07/22 0815       Subjective: Patient seen and evaluated today and is nonverbal.  Apparently he pulled out his Dobbhoff yesterday.  Girlfriend could not be contacted.  Objective:  Vitals:   09/25/22 1700 09/25/22 2128 09/26/22 0500 09/26/22 0650  BP: 96/65 (!) 101/58  94/61  Pulse: (!) 54 65  97  Resp: 18 20  20   Temp: (!) 97.4 F (36.3 C) 98.3 F (36.8 C)  (!) 97.5 F (36.4 C)  TempSrc: Axillary Oral    SpO2: 100% 100%  100%  Weight:   49.9 kg   Height:        Intake/Output Summary (Last 24 hours) at 09/26/2022 0714 Last data filed at 09/26/2022 0548 Gross per 24 hour  Intake 4857.65 ml  Output 1000 ml  Net 3857.65 ml   Filed Weights   09/23/22 0441 09/24/22 0427 09/26/22 0500   Weight: 49.5 kg 49.5 kg 49.9 kg    Examination:  General exam: Appears calm and comfortable, nonverbal Respiratory system: Clear to auscultation. Respiratory effort normal. Cardiovascular system: S1 & S2 heard, RRR.  Gastrointestinal system: Abdomen is soft Central nervous system: Alert and awake Extremities: No edema Skin: No significant lesions noted    Data Reviewed: I have personally reviewed following labs and imaging studies  CBC: Recent Labs  Lab 09/21/22 1243 09/22/22 0837  WBC 3.3* 3.0*  NEUTROABS  --  1.2*  HGB 13.5 12.2*  HCT 44.9 39.3  MCV 79.5* 78.3*  PLT 168 725   Basic Metabolic Panel: Recent Labs  Lab 09/21/22 1243 09/22/22 0547 09/23/22 0459 09/24/22 0734 09/25/22 0337 09/26/22 0337  NA 138  --  136 134* 136 135  K 3.6  --  4.2 4.6 4.3 4.3  CL 102  --  101 100 100 98  CO2 29  --  26 25 27 24   GLUCOSE 84  --  99 84 81 86  BUN 9  --  9 10 14 15   CREATININE 0.73  --  0.67 0.69 0.68 0.81  CALCIUM 9.6  --  9.1 9.1 9.2 9.3  MG  --  1.5* 1.8  --   --  1.9   GFR: Estimated Creatinine Clearance: 66.7 mL/min (by C-G formula based on SCr of 0.81 mg/dL). Liver Function Tests: Recent Labs  Lab 09/23/22 0459 09/24/22 0734 09/25/22 0337 09/26/22 0337  AST 26 25 76* 73*  ALT 22 20 63* 82*  ALKPHOS 68 68 74 75  BILITOT 0.6 0.2* 0.3 <0.1*  PROT 6.6 6.6 6.9 7.3  ALBUMIN 2.9* 2.9* 3.0* 3.1*   No results for input(s): "LIPASE", "AMYLASE" in the last 168 hours. Recent Labs  Lab 09/22/22 0547 09/24/22 0734 09/25/22 0337  AMMONIA 43* 37* 49*   Coagulation Profile: No results for input(s): "INR", "PROTIME" in the last 168 hours. Cardiac Enzymes: No results for input(s): "CKTOTAL", "CKMB", "CKMBINDEX", "TROPONINI" in the last 168 hours. BNP (last 3 results) No results for input(s): "PROBNP" in the last 8760 hours. HbA1C: No results for input(s): "HGBA1C" in the last 72 hours. CBG: Recent Labs  Lab 09/24/22 0753 09/24/22 1420 09/25/22 1153  09/25/22 1817 09/26/22 0132  GLUCAP 91 114* 91 116* 106*   Lipid Profile: No results for input(s): "CHOL", "HDL", "LDLCALC", "TRIG", "CHOLHDL", "LDLDIRECT" in the last 72 hours. Thyroid Function Tests: No results for input(s): "TSH", "T4TOTAL", "FREET4", "T3FREE", "THYROIDAB" in the last 72 hours. Anemia Panel: No results for input(s): "VITAMINB12", "FOLATE", "FERRITIN", "TIBC", "IRON", "RETICCTPCT" in the last 72 hours. Sepsis Labs: No results for input(s): "PROCALCITON", "LATICACIDVEN" in the last 168 hours.  No results found for this or any previous visit (from the past 240 hour(s)).  Radiology Studies: No results found.      Scheduled Meds:  amphetamine-dextroamphetamine  10 mg Oral Q breakfast   benztropine  0.5 mg Oral BID   Or   benztropine mesylate  0.5 mg Intramuscular BID   enoxaparin (LOVENOX) injection  40 mg Subcutaneous Q24H   folic acid  1 mg Oral Daily   nystatin   Topical BID   Continuous Infusions:  dextrose 75 mL/hr at 09/26/22 0548   feeding supplement (OSMOLITE 1.2 CAL) 70 mL/hr at 09/26/22 0548   valproate sodium 250 mg (09/26/22 0548)     LOS: 20 days    Time spent: 35 minutes    Grover Robinson Hoover Brunette, DO [facility]  If 7PM-7AM, please contact night-coverage [email or web address] 09/26/2022, 7:14 AM

## 2022-09-26 NOTE — Progress Notes (Signed)
Gave pt hs cogentin in applesauce, asked pt if he liked the cold applesauce, pt stated "yes."  Pt ate approx 50% cup of applesauce, then later drank 240 ml of ensure breeze. No coughing present.  Noted pt having continuous involuntary muscle twitching on legs, buttocks during personal cares.  Receive lab orders to check balances.  Pt has no documented BM since 1/1, has been on tube feeds and having oral intake.  Smearing noted tonight, message sent to provider for additional direction.

## 2022-09-26 NOTE — Progress Notes (Signed)
Palliative: [name] is lying quietly in bed.  He appears acutely/chronically ill and very frail, cachectic.  He does not respond in any meaningful way to voice or touch.  He clearly cannot make his needs known.  There is no family at bedside at this time.  Bedside nursing staff is present as a Air cabin crew.  Nursing staff shares that [name] did not eat his breakfast this morning.  Unfortunately, he removed temporary NG feeding tube yesterday.  Call to significant other, [name].   Went right to [employer].  Voicemail message left.  Conference with attending, bedside nursing staff, transition of care team, registered dietitian related to patient condition, needs, goals of care, disposition.  Plan: At this point continue to treat the treatable but no CPR or intubation.  Patient removed temporary feeding tube yesterday.  Attending to attempt a trial of Megace for appetite stimulation.    Would be appropriate for residential hospice placement.  46 minutes  Tyler Axe, NP Palliative medicine team Team phone [phone] Greater than 50% of this time was spent counseling and coordinating care related to the above assessment and plan.

## 2022-09-26 NOTE — Ethics Note (Signed)
Follow up note to original consult 09/20/2022 Received call this morning from Dr. Heath Lark to request guidance on decision-making   Updates from time of initial consult to now include the following:  Patient continues to deteriorate medically - now nonverbal and not communicating  patient's significant other who has been his primary decision-maker has not been available/willing to participate in discussions around the patient's medical care.   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  As such, per [place] statute (see original consult note and below) the attending physician, once other options are exhausted, may act in consultation with at least one other physician to make medical decisions for the patient based on their professional judgment of the patient's best interest (keeping in mind patient goals/values if known).  Thus, I suggested that Dr Manuella Ghazi proceed as follows Specify which medical intervention(s) is/are being considered (for example PEG tube placement) and what the potential benefits/risks are and how the treatment(s) may help patient meet goals.  If it is felt that an intervention is futile (will not meet goals) or will cause more harm than benefit, then the medical team is not ethically obligated to offer or continue  such interventions.  Would recommend consultation with another consulting physician re: futility of a treatment. Recommend ask neurology for formal opinion on prognosis of the patient's neurological function with/without continuing aggressive care.  If surrogate decision maker voices a demand for what the medical team considers to be futile intervention, then please reengage with Ethics team ASAP.     Relevant underlying ethical principles were discussed directly with Dr. Manuella Ghazi. Other resources were discussed directly with them, if needed. Ethics committee strives to ensure that all necessary and appropriate steps are taken by the medical team such that all decisions made for this patient are ethically appropriate. Please note that Ethics committee members will NOT offer legal or medical counsel.  Thank you for this consult. Ethics will continue to follow this case.   Dr Manuella Ghazi has my personal cell phone number and has my permission to share this number at their discretion.  Secure message on Epic is also welcome but may not receive an immediate response.  Please reference AMION for on-call committee member if needed.    [place]

## 2022-09-26 NOTE — Progress Notes (Signed)
   09/26/22 1311  Assess: MEWS Score  Temp (!) 97.4 F (36.3 C)  BP 98/67  MAP (mmHg) 77  Pulse Rate (!) 44  SpO2 100 %  O2 Device Room Air  Assess: MEWS Score  MEWS Temp 0  MEWS Systolic 1  MEWS Pulse 1  MEWS RR 0  MEWS LOC 0  MEWS Score 2  MEWS Score Color Yellow  Assess: if the MEWS score is Yellow or Red  Were vital signs taken at a resting state? Yes  Focused Assessment No change from prior assessment  Does the patient meet 2 or more of the SIRS criteria? No  MEWS guidelines implemented *See Row Information* Yes  Treat  MEWS Interventions Escalated (See documentation below)  Pain Scale PAINAD  Breathing 0  Negative Vocalization 0  Facial Expression 0  Body Language 0  Consolability 0  PAINAD Score 0  Take Vital Signs  Increase Vital Sign Frequency  Yellow: Q 2hr X 2 then Q 4hr X 2, if remains yellow, continue Q 4hrs  Escalate  MEWS: Escalate Yellow: discuss with charge nurse/RN and consider discussing with provider and RRT  Notify: Charge Nurse/RN  Name of Charge Nurse/RN Notified Maryann RN  Date Charge Nurse/RN Notified 09/26/22  Time Charge Nurse/RN Notified 1330  Provider Notification  Provider Name/Title Dr. Manuella Ghazi  Date Provider Notified 09/26/22  Time Provider Notified 1538  Method of Notification Page  Notification Reason Other (Comment) (yellow MEWS)  Provider response No new orders  Assess: SIRS CRITERIA  SIRS Temperature  0  SIRS Pulse 0  SIRS Respirations  0  SIRS WBC 0  SIRS Score Sum  0

## 2022-09-27 DIAGNOSIS — Z7189 Other specified counseling: Secondary | ICD-10-CM | POA: Diagnosis not present

## 2022-09-27 DIAGNOSIS — G9341 Metabolic encephalopathy: Secondary | ICD-10-CM | POA: Diagnosis not present

## 2022-09-27 DIAGNOSIS — Z515 Encounter for palliative care: Secondary | ICD-10-CM | POA: Diagnosis not present

## 2022-09-27 DIAGNOSIS — R627 Adult failure to thrive: Secondary | ICD-10-CM | POA: Diagnosis not present

## 2022-09-27 LAB — AMMONIA: Ammonia: 41 umol/L — ABNORMAL HIGH (ref 9–35)

## 2022-09-27 LAB — COMPREHENSIVE METABOLIC PANEL
ALT: 68 U/L — ABNORMAL HIGH (ref 0–44)
AST: 55 U/L — ABNORMAL HIGH (ref 15–41)
Albumin: 2.9 g/dL — ABNORMAL LOW (ref 3.5–5.0)
Alkaline Phosphatase: 67 U/L (ref 38–126)
Anion gap: 10 (ref 5–15)
BUN: 13 mg/dL (ref 8–23)
CO2: 27 mmol/L (ref 22–32)
Calcium: 9.1 mg/dL (ref 8.9–10.3)
Chloride: 102 mmol/L (ref 98–111)
Creatinine, Ser: 0.72 mg/dL (ref 0.61–1.24)
GFR, Estimated: >60 mL/min (ref >60)
Glucose, Bld: 108 mg/dL — ABNORMAL HIGH (ref 70–99)
Potassium: 3.8 mmol/L (ref 3.5–5.1)
Sodium: 139 mmol/L (ref 135–145)
Total Bilirubin: 0.4 mg/dL (ref 0.3–1.2)
Total Protein: 7 g/dL (ref 6.5–8.1)

## 2022-09-27 LAB — CBC
HCT: 47.1 % (ref 39.0–52.0)
Hemoglobin: 14 g/dL (ref 13.0–17.0)
MCH: 23.9 pg — ABNORMAL LOW (ref 26.0–34.0)
MCHC: 29.7 g/dL — ABNORMAL LOW (ref 30.0–36.0)
MCV: 80.2 fL (ref 80.0–100.0)
Platelets: 179 10*3/uL (ref 150–400)
RBC: 5.87 MIL/uL — ABNORMAL HIGH (ref 4.22–5.81)
RDW: 16 % — ABNORMAL HIGH (ref 11.5–15.5)
WBC: 4.6 10*3/uL (ref 4.0–10.5)
nRBC: 0 % (ref 0.0–0.2)

## 2022-09-27 LAB — MAGNESIUM: Magnesium: 1.8 mg/dL (ref 1.7–2.4)

## 2022-09-27 MED ORDER — DIVALPROEX SODIUM 125 MG PO CSDR
250.0000 mg | DELAYED_RELEASE_CAPSULE | Freq: Three times a day (TID) | ORAL | Status: DC
  Administered 2022-09-28 – 2022-10-10 (×33): 250 mg via ORAL
  Filled 2022-09-27 (×33): qty 2

## 2022-09-27 MED ORDER — VALPROATE SODIUM 100 MG/ML IV SOLN
250.0000 mg | Freq: Three times a day (TID) | INTRAVENOUS | Status: AC
  Administered 2022-09-27 – 2022-09-28 (×3): 250 mg via INTRAVENOUS
  Filled 2022-09-27 (×3): qty 2.5

## 2022-09-27 NOTE — Progress Notes (Signed)
Patient has been restless today. Given ativan x2 along with haldol x1. Patient ate well for breakfast and lunch, but not for dinner with assistance.. Patient was trying to get out of the bed later this evening and ativan was ineffective, haldol was given and he is currently sleeping.

## 2022-09-27 NOTE — Progress Notes (Signed)
Palliative: [name], [name], is lying quietly in bed in a dark room.  He appears acutely/chronically ill and very frail, cachectic.  He seems uncomfortable most days, squirming in the bed.  He is alert, but does not attempt to make eye contact.  He does not respond verbally to my orientation questions.  I do not believe that he can make his basic needs known.  There is no family at bedside at this time.  Bedside nursing staff is present as a Air cabin crew.  [name] is having a calorie count.  It seems that [name] has been eating when fed by others.  I was able to feed him the ice cream this morning.  When the spoon is brought to his face he automatically opens his mouth.  There are no signs and symptoms of aspiration.   [name] is experiencing progression with dementia.  For significant other this has been ongoing for several years.  She shares with me that he was not eating well, would chew food and then spit it out.  It seems that his issues are chronic and irreversible.  Followed by neurology.  Call to significant other, [name].  No answer, left voicemail message.   Conference with attending, bedside nursing staff, registered dietitian, transition of care team related to patient condition, needs, goals of care, disposition.    Plan: At this point continue to treat the treatable but no CPR or intubation.  Conducting calorie count. Palliative team to continue to follow. Followed by ethics team.  See note 09/26/2022  50 minutes  Quinn Axe, NP Palliative medicine team Team phone [phone] Greater than 50% of this time was spent counseling and coordinating care related to the above assessment and plan.

## 2022-09-27 NOTE — TOC Progression Note (Signed)
Transition of Care [facility]) - Progression Note    Patient Details  Name: [name] MRN: [id number] Date of Birth: [birth year]  Transition of Care [facility] CM/SW [place], [place] Phone Number: 09/27/2022, 4:07 PM  Clinical Narrative:    CSW updated by medical treatment team that APS report needs to be made for emergency protective order for guardianship. CSW made APS report at this time. CSW to be updated if report is screened in. TOC to follow.   Expected Discharge Plan: [place] Barriers to Discharge: Continued Medical Work up  Expected Discharge Plan and Services In-house Referral: Clinical Social Work   Post Acute Care Choice: [place] Living arrangements for the past 2 months: Liberal Determinants of Health (SDOH) Interventions SDOH Screenings   Tobacco Use: Medium Risk (09/06/2022)    Readmission Risk Interventions    09/07/2022   12:35 PM  Readmission Risk Prevention Plan  Medication Screening Complete  Transportation Screening Complete

## 2022-09-27 NOTE — Progress Notes (Signed)
I connected with  [name] on 09/27/22 by a video enabled telemedicine application and verified that I am speaking with the correct person using two identifiers.   I discussed the limitations of evaluation and management by telemedicine. The patient expressed understanding and agreed to proceed.  Location of patient: [facility] Location of physician: [facility]  Subjective: Patient continues to be in restraints, requiring as needed Ativan due to agitation. Does eat if someone sits and feeds him but still not enough calories.  Does not interact with the team  ROS: Unable to obtain due to poor mental status  Examination  Vital signs in last 24 hours: Temp:  [97.8 F (36.6 C)-98.2 F (36.8 C)] 97.9 F (36.6 C) (01/11 1347) Pulse Rate:  [49-98] 71 (01/11 1347) Resp:  [18-19] 18 (01/11 1347) BP: (92-108)/(57-67) 108/66 (01/11 1347) SpO2:  [95 %-100 %] 100 % (01/11 1347) Weight:  [51.3 kg] 51.3 kg (01/11 0500)  General: lying in bed, cachectic appearing Neuro: Was awake and looked at me but as soon as I started to talk to him, he turned away from me and went into fetal position, his pupils appear to be reactive, his extraocular movements appear to be intact, he does not appear to have any facial asymmetry, he was spontaneously moving all 4 extremities in bed, unable to do rest of the exam as patient refuses to interact  Basic Metabolic Panel: Recent Labs  Lab 09/22/22 0547 09/23/22 0459 09/24/22 0734 09/25/22 0337 09/26/22 0337 09/27/22 0422  NA  --  136 134* 136 135 139  K  --  4.2 4.6 4.3 4.3 3.8  CL  --  101 100 100 98 102  CO2  --  26 25 27 24 27   GLUCOSE  --  99 84 81 86 108*  BUN  --  9 10 14 15 13   CREATININE  --  0.67 0.69 0.68 0.81 0.72  CALCIUM  --  9.1 9.1 9.2 9.3 9.1  MG 1.5* 1.8  --   --  1.9 1.8    CBC: Recent Labs  Lab 09/21/22 1243 09/22/22 0837 09/27/22 0422  WBC 3.3* 3.0* 4.6  NEUTROABS  --  1.2*  --   HGB 13.5 12.2* 14.0   HCT 44.9 39.3 47.1  MCV 79.5* 78.3* 80.2  PLT 168 177 179     Coagulation Studies: No results for input(s): "LABPROT", "INR" in the last 72 hours.  Imaging No new brain imaging overnight  ASSESSMENT AND PLAN: 62yo M with multiple medical comorbidities as noted in HPI admitted for failure to thrive.   Acute encephalopathy Dysphagia Failure to thrive Bipolar disorder Dementia -Does not have any reversible causes of his current mental status.  From his chart review and this hospitalization, I suspect patient has rapidly progressive dementia and subsequent failure to thrive. -We have tried to get in touch with patient's significant other but she has been difficult to contact. I agree with primary care team that patient is an appropriate candidate for hospice at this point  I have spent a total of  26  minutes with the patient reviewing hospital notes,  test results, labs and examining the patient as well as establishing an assessment and plan.  > 50% of time was spent in direct patient care.    Thank you for allowing [place] to participate in the care of this patient. If you have any further questions, please contact  me or neurohospitalist.   Zeb Com[facility] Epilepsy  [facility] For questions after 5pm please refer to AMION to reach the Neurologist on call

## 2022-09-27 NOTE — Progress Notes (Signed)
Pt was initially very agitated during start of shift.  Was able to be restful for period of time after ativan administration, but later woke up agitated, pulled out IV.  Noted that pt was taking mitt on hand and rubbing at rectum.  Pt had stool smearing on sheets.  Assisted pt with 2 assist to [facility], pt had large brown BM.  Pt able to stand for approx 3 minutes after BM and bear wt on both legs.  Ate full cup of ice cream and 240 ml boost breeze.  Has been resting more peacefully since.

## 2022-09-27 NOTE — Progress Notes (Addendum)
PROGRESS NOTE    [name]  EVO:[id number] DOB: June 08, 1961 DOA: 09/05/2022 PCP: [facility]   Brief Narrative:    [name] is a 62 yo male with PMH dementia, bipolar 1 disorder, HLD, HTN, CAD, CVA who presented with confusion and dysphagia. With further workup he was also found to be agitated/impulsive, and unable to be redirected.  He required Haldol and Ativan.  His significant other was concerned about his inability to swallow and he was brought for further workup. Because of the difficulty eating/dysphagia, he was initially initiated on a stroke workup.  However, further workup also was notable for an elevated lithium level.  Given his presenting symptoms, it was felt that they were attributed to lithium toxicity.  Poison control was called and patient was started on fluids and admitted. Despite aggressive measures, he showed little improvement during hospitalization.  Given his poor physical reserve and declining functional status leading up to hospitalization, he appeared to be more consistent with failure to thrive.  Goals of care discussions were held with his significant other who is his main caretaker and decision maker.  He was made DNR and continued on medical treatment in the hopes for some improvement however his prognosis was made clear that it appears to be worsening daily.  Palliative continues to have ongoing discussions with the girlfriend and ethics is involved.    09/18/22 -Had face-to-face Conference with patient's girlfriend on 09/13/2022 -Patient girlfriend requested that I do Not call her to talk to her about patient's poor prognosis anymore, because it makes her sad.- -Patient's girlfriend is accusing the medical team of "prematurely giving up on patient" --She tells me that she is not ready to make any decisions about possible de-escalation of care until the middle of January at the earliest -oral intake remains very  poor -Hypoglycemia with blood sugar of 69 noted despite continuous IV dextrose drip-- -will increase IV dextrose rate -Overall prognosis remains poor given very poor oral intake due to persistent metabolic encephalopathy superimposed on advanced dementia   09/19/22 -Remain calm comfortable, remained calm, comfortable, sleepy -Refused to eat or to be fed   09/20/2022 -Agitated confused overnight, Ativan and Haldol was used overnight -Appreciate palliative care following, and consulted ethics committee Current recommendation is neuroconsultation, attempt of Dobbhoff placement and initiation of tube feeding Fails we will proceed with comfort care     09/21/2022 -Some, minimal interaction between him and the nursing staff noted this morning with some oral intake -Plan remains for Dobbhoff placement per ethics committee recommendations Nutrition to be consulted for tube feeds -Psych has been consulted for evaluation -Pending neuro eval   09/22/2022 -Patient received Dobbhoff feeding tube placement by IR on 09/21/2022 -Nutrition has started tube feeding -Neurology has evaluated the patient-looking for reversible causes -Agitated overnight received Ativan around 3 AM Unresponsive non-interactive this a.m.   09/23/2022 -Patient was seen and examined -Agitated earlier per nursing staff -Dobbhoff tube still in place, tube feeds,     09/24/2022 -A bit more responsive to the nursing staff at bedside was encouraging oral intake -Drop-offs still present -tolerating tube feeds     09/25/2022 -Opens his eyes but does not follow command, nonverbal, noncommunicating -Per nursing staff agitated earlier this morning, has had p.o. intake for first time today -Dobbhoff feeding tube still in place   09/26/22 -Patient is nonverbal and appears to have pulled out feeding tube on 1/9 -Appears to tolerating some oral intake -Trial of lactulose today due  to elevated ammonia levels -Depakote recently increased  by behavioral health -Trial of Megace to see if this helps to improve appetite -Continue folate repletion -Appears to be having more of a failure to thrive picture and is appropriate for hospice  09/27/22 -Calorie count initiated to help see if pt meeting caloric needs -As per Neurology note, patient is an appropriate candidate for hospice. Since next of kin cannot be contacted at this point, attempts will be made to transfer to facility for hospice care.   Assessment & Plan:   Principal Problem:   Acute metabolic encephalopathy Active Problems:   Bipolar 1 disorder (HCC)   Dysphagia   Lithium toxicity   Goals of care, counseling/discussion   Severe/Advanced Dementia with behavioral disturbance (HCC)   FTT (failure to thrive) in adult   Hypernatremia   Hyperlipidemia   Prolonged QT interval   Protein-calorie malnutrition, severe  Assessment and Plan:   1)Lithium Toxicity Resolved   - Lithium level 1.51 on admission -- lithium level normalized and dropped -No change in mentation,-not interactive, unresponsive   -Status post psych/TTS evaluation - appreciate tele-psych rec's; he has been taken off lithium and started on depakote; continue p.o. for now and if necessary,  transitioned to IV   - [name]: Cachectic, unresponsive, not interactive, poor p.o. 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 Verner Chol -recommended trial of Dobbhoff feeding tube placement and tube feeding -      -No improvement-minimally interactive, impulsive,-continue to pull out IVs telemetry lines -As needed Ativan, Haldol -No PO nutritional intake -IV fluid limited as he is pulling the line out   -Palliative care team, and ethics committee consulted: Recommended: Attempt Dobbhoff tube and tube feeds if fails progress to comfort care Recommended Neuro consultation   - patient has been hospitalized for days at that point and he has shown no signs of improvement.  He remains minimally interactive and impulsive, constantly trying to climb out of bed.  He has pulled IV out as well as telemetry off repetitively.   -He also has had extremely minimal nutritional intake since admission.    -WE  do not find an to be a good candidate for G- tube placement as he will inevitably pull this out as well and PEG tube placement also considered medically futile and inappropriate in context of his multiple comorbidities, poor quality of life, and progressive functional decline   - Discussed with Sallye Ober that he  does not appear to have the physical reserve to overcome the events that have led up to this hospitalization.  He may also not survive this hospitalization.       - [name] agrees with DNR status at this time. She does wish for ongoing attempts/treatment to see if he improves, but should he continue to decline (which I expect), then a natural death will be allowed. He is currently not a rehab candidate unless were to show tremendous improvement and rather he appears more appropriate for inpatient vs residential hospice depending on course of events over the next few days -Overall prognosis remains poor/grave -Family contact--   Deneise Lever [phone] -As per Tyler Sallye Ober [name] .... No living family members.  -Palliative working on contacting Tyler. [name]       4)Hypernatremia -resolved   5)Acute metabolic encephalopathy in setting of baseline advanced/severe dementia with significant cognitive and memory deficits at baseline - Etiology attributed to probable lithium toxicity - CT head unremarkable for acute findings.  Chronic left frontal lobe infarct appreciated -MRI brain also negative for acute stroke -Patient is reported to have some altered mentation at baseline as well; per girlfriend,  he is mostly nonverbal at baseline----please see #2 above -Continue sitter -Continue assist of nursing staff -Trial of lactulose for elevated ammonia level -Continue folate repletion -Depakote as adjusted per behavioral health   6)Dysphagia -Multifactorial including lithium toxicity, psychiatric disorder refusing oral intake -First oral intake 09/25/2022   -Per ethic committee trial of Dobbhoff tube place and enteric feeding if it fails we will proceed with comfort care   - Recent EGD June 2023, cannot see results, but per GI had gastritis and Candida esophagitis - unfortunately no signs of improvement still - see GOC - evaluated by SLP - continue offering diet as able; poor candidate for NGT  and PEG candidate; not appropriate for TPN (No clear goals for long-term solution) -Pulled out Dobbhoff tube     Leukocytosis -likely reactive-resolved -Chest x-ray on 09/17/2022 without acute findings -Get blood cultures, check UA -Hold off on antibiotics   Prolonged QT interval - Likely related to elevated lithium level - patient pulling off tele too much for meaning capture; okay to d/c - serial EKGs as he allows too - Hold QT prolonging agents when possible   Hyperlipidemia -discontinued statin, risk outweighs the benefit for this patient   Bipolar 1 disorder (HCC) - Discontinued lithium, per psych recommendation on Cogentin, IV Depakote - haldol / ativan PRN   Contamination of blood culture-resolved as of 09/09/2022 - 1/4 bottles noted with GPC from 12/20 but apparently nothing seen on media per micro. Has remained afebrile with minimal leukocytosis (considered reactive from other processes) - Will Repeat blood culture if becomes febrile      DVT prophylaxis:Lovenox Code Status: Full Family Communication: Tried calling significant other with no response 1/11 Disposition Plan:  Status is: Inpatient Remains inpatient appropriate because: Need for ongoing close monitoring and IV medications.   Consultants:  Palliative Neurology Behavioral health Ethics  Procedures:  None  Antimicrobials:  Anti-infectives (From admission, onward)    Start     Dose/Rate Route Frequency Ordered Stop   09/06/22 0900  voriconazole (VFEND) tablet 200 mg  Status:  Discontinued        200 mg Oral  Once 09/05/22 1941 09/07/22 0815       Subjective: Patient seen and evaluated today and is nonverbal.  He tolerates minimal oral intake.  Girlfriend could not be contacted.  Objective: Vitals:   09/26/22 1710 09/26/22 1901 09/26/22 2236 09/27/22 0500  BP: 100/66 92/65 93/67    Pulse: (!) 49 98 89   Resp:  19 18   Temp: 97.8 F (36.6 C) 98 F (36.7 C) 98.2 F (36.8 C)   TempSrc:   Oral    SpO2: 95% 100% 100%   Weight:    51.3 kg  Height:        Intake/Output Summary (Last 24 hours) at 09/27/2022 0946 Last data filed at 09/27/2022 0900 Gross per 24 hour  Intake 3192.75 ml  Output 1900 ml  Net 1292.75 ml   Filed Weights   09/24/22 0427 09/26/22 0500 09/27/22 0500  Weight: 49.5 kg 49.9 kg 51.3 kg    Examination:  General exam: Appears calm and comfortable, nonverbal Respiratory system: Clear to auscultation. Respiratory effort normal. Cardiovascular system: S1 & S2 heard, RRR.  Gastrointestinal system: Abdomen is soft Central nervous system: Alert and awake Extremities: No edema Skin: No significant lesions noted    Data Reviewed: I have personally reviewed following labs and imaging studies  CBC: Recent Labs  Lab 09/21/22 1243 09/22/22 0837 09/27/22 0422  WBC 3.3* 3.0* 4.6  NEUTROABS  --  1.2*  --   HGB 13.5 12.2* 14.0  HCT 44.9 39.3 47.1  MCV 79.5* 78.3* 80.2  PLT 168 177 664   Basic Metabolic Panel: Recent Labs  Lab 09/22/22 0547 09/23/22 0459 09/24/22 0734 09/25/22 0337 09/26/22 0337 09/27/22 0422  NA  --  136 134* 136 135 139  K  --  4.2 4.6 4.3 4.3 3.8  CL  --  101 100 100 98 102  CO2  --  26 25 27 24 27   GLUCOSE  --  99 84 81 86 108*  BUN  --  9 10 14 15 13   CREATININE  --  0.67 0.69 0.68 0.81 0.72  CALCIUM  --  9.1 9.1 9.2 9.3 9.1  MG 1.5* 1.8  --   --  1.9 1.8   GFR: Estimated Creatinine Clearance: 69.5 mL/min (by C-G formula based on SCr of 0.72 mg/dL). Liver Function Tests: Recent Labs  Lab 09/23/22 0459 09/24/22 0734 09/25/22 0337 09/26/22 0337 09/27/22 0422  AST 26 25 76* 73* 55*  ALT 22 20 63* 82* 68*  ALKPHOS 68 68 74 75 67  BILITOT 0.6 0.2* 0.3 <0.1* 0.4  PROT 6.6 6.6 6.9 7.3 7.0  ALBUMIN 2.9* 2.9* 3.0* 3.1* 2.9*   No results for input(s): "LIPASE", "AMYLASE" in the last 168 hours. Recent Labs  Lab 09/22/22 0547 09/24/22 0734 09/25/22 0337 09/27/22 0628  AMMONIA 43* 37* 49* 41*   Coagulation  Profile: No results for input(s): "INR", "PROTIME" in the last 168 hours. Cardiac Enzymes: No results for input(s): "CKTOTAL", "CKMB", "CKMBINDEX", "TROPONINI" in the last 168 hours. BNP (last 3 results) No results for input(s): "PROBNP" in the last 8760 hours. HbA1C: No results for input(s): "HGBA1C" in the last 72 hours. CBG: Recent Labs  Lab 09/24/22 0753 09/24/22 1420 09/25/22 1153 09/25/22 1817 09/26/22 0132  GLUCAP 91 114* 91 116* 106*   Lipid Profile: No results for input(s): "CHOL", "HDL", "LDLCALC", "TRIG", "CHOLHDL", "LDLDIRECT" in the last  72 hours. Thyroid Function Tests: No results for input(s): "TSH", "T4TOTAL", "FREET4", "T3FREE", "THYROIDAB" in the last 72 hours. Anemia Panel: No results for input(s): "VITAMINB12", "FOLATE", "FERRITIN", "TIBC", "IRON", "RETICCTPCT" in the last 72 hours. Sepsis Labs: No results for input(s): "PROCALCITON", "LATICACIDVEN" in the last 168 hours.  No results found for this or any previous visit (from the past 240 hour(s)).       Radiology Studies: No results found.      Scheduled Meds:  amphetamine-dextroamphetamine  10 mg Oral Q breakfast   benztropine  0.5 mg Oral BID   Or   benztropine mesylate  0.5 mg Intramuscular BID   enoxaparin (LOVENOX) injection  40 mg Subcutaneous Q24H   folic acid  1 mg Oral Daily   megestrol  200 mg Oral BID   nystatin   Topical BID   Continuous Infusions:  dextrose 5 % and 0.9% NaCl Stopped (09/27/22 0537)   feeding supplement (OSMOLITE 1.2 CAL) 70 mL/hr at 09/27/22 0545   valproate sodium 52.5 mL/hr at 09/27/22 0545     LOS: 21 days    Time spent: 35 minutes    Adrick Kestler Hoover Brunette, DO [facility]  If 7PM-7AM, please contact night-coverage [email or web address] 09/27/2022, 9:46 AM

## 2022-09-27 NOTE — Progress Notes (Signed)
Nutrition Follow up  DOCUMENTATION CODES:   Severe malnutrition in context of chronic illness  underweight  INTERVENTION:   Dysphagia 2 diet with nectar thick liquids  Add Magic cup with meals  D/c Osmolite   Calorie count initiated x 48 hrs  Assist with feeding all meals   NUTRITION DIAGNOSIS:   Severe Malnutrition related to chronic illness, acute illness (acute metabolic encephalopathy in setting of dementia, dysphagia) as evidenced by energy intake < or equal to 50% for > or equal to 5 days, severe fat depletion, severe muscle depletion, energy intake < 75% for > or equal to 1 month.  - ongoing   GOAL:  Patient will meet greater than or equal to 90% of their needs (if feasible given his confusion) - not met  MONITOR:  PO intake, Labs, TF tolerance, Weight trends  ASSESSMENT: Patient is a 63 yo male with hx of dementia, HTN, CVA who presented  with confusion and dysphagia. Acute metabolic encephalopathy.   Reported poor oral intake since admission (0% of most meals 10% x 1 and 50% x 1). Per MD neurology consult, attempt Dobbhoff placement and start tube feeding if unable to place tube- transition to comfort focus.   Goals of care discussed with girlfriend per MD.   Talked with nursing. Radiology to place tube sometime today. Recommendations for formula and rate above.   Patient lunch tray is here untouched. Patient severely malnourished. PEG placement not recommended given his demented state.   1/9 Sitter is bedside and pt lunch tray is here. He drank 100% of nectar thick tea and ate 50% of vanilla pudding. No protein or vegetables. NT reports good breakfast intake. Tube feeding turned off this morning per report in order to encourage oral nutrition. Will add magic cup with meals to support. Recommend consider bowel regimen NO BM 3+ days.   1/11 Patient NG tube is out. Calorie count started. Nutrition staff notified. Talked with nursing -patient assisted with meal and  ate 100% of breakfast and 4 oz ice cream. RD will follow with results of calorie count as data is available.   Weights reviewed. Last weight 12/20-59.4 kg. Will request new weight for assessment. Current weight obtained 49.5 kg which reflects severe loss of 10 kg (17%) < 1 month.   Medications: reviewed.      Latest Ref Rng & Units 09/27/2022    4:22 AM 09/26/2022    3:37 AM 09/25/2022    3:37 AM  BMP  Glucose 70 - 99 mg/dL 108  86  81   BUN 8 - 23 mg/dL 13  15  14    Creatinine 0.61 - 1.24 mg/dL 0.72  0.81  0.68   Sodium 135 - 145 mmol/L 139  135  136   Potassium 3.5 - 5.1 mmol/L 3.8  4.3  4.3   Chloride 98 - 111 mmol/L 102  98  100   CO2 22 - 32 mmol/L 27  24  27    Calcium 8.9 - 10.3 mg/dL 9.1  9.3  9.2         Diet Order:   Diet Order             DIET DYS 2 Room service appropriate? Yes; Fluid consistency: Nectar Thick  Diet effective now                   EDUCATION NEEDS:  Not appropriate for education at this time  Skin:  Skin Assessment: Reviewed RN Assessment  Last BM:  1/11 type 2 large  Height:   Ht Readings from Last 1 Encounters:  09/05/22 5\' 8"  (1.727 m)    Weight:   Wt Readings from Last 1 Encounters:  09/27/22 51.3 kg    Ideal Body Weight:   70 kg  BMI:  Body mass index is 17.2 kg/m.  Estimated Nutritional Needs:   Kcal:  1900-2100  Protein:  100-105 gr  Fluid:  1800 ml daily  Colman Cater MS,RD,CSG,LDN Contact: Shea Evans

## 2022-09-28 DIAGNOSIS — G9341 Metabolic encephalopathy: Secondary | ICD-10-CM | POA: Diagnosis not present

## 2022-09-28 NOTE — Progress Notes (Signed)
Nutrition Follow up  DOCUMENTATION CODES:   Severe malnutrition in context of chronic illness  underweight  INTERVENTION:   Dysphagia 2 diet with nectar thick liquids  Add Magic cup with meals  D/c Osmolite   Calorie count initiated x 48 hrs  Assist with feeding all meals   NUTRITION DIAGNOSIS:   Severe Malnutrition related to chronic illness, acute illness (acute metabolic encephalopathy in setting of dementia, dysphagia) as evidenced by energy intake < or equal to 50% for > or equal to 5 days, severe fat depletion, severe muscle depletion, energy intake < 75% for > or equal to 1 month.  - ongoing   GOAL:  Patient will meet greater than or equal to 90% of their needs (if feasible given his confusion) - not met  MONITOR:  PO intake, Labs, TF tolerance, Weight trends  ASSESSMENT: Patient is a 62 yo male with hx of dementia, HTN, CVA who presented  with confusion and dysphagia. Acute metabolic encephalopathy.   Reported poor oral intake since admission (0% of most meals 10% x 1 and 50% x 1). Per MD neurology consult, attempt Dobbhoff placement and start tube feeding if unable to place tube- transition to comfort focus.   Goals of care discussed with girlfriend per MD.   Talked with nursing. Radiology to place tube sometime today. Recommendations for formula and rate above.   Patient lunch tray is here untouched. Patient severely malnourished. PEG placement not recommended given his demented state.   1/9 Sitter is bedside and pt lunch tray is here. He drank 100% of nectar thick tea and ate 50% of vanilla pudding. No protein or vegetables. NT reports good breakfast intake. Tube feeding turned off this morning per report in order to encourage oral nutrition. Will add magic cup with meals to support. Recommend consider bowel regimen NO BM 3+ days.   1/11 Patient NG tube is out. Calorie count started. Nutrition staff notified. Talked with nursing -patient assisted with meal and  ate 100% of breakfast and 4 oz ice cream. RD will follow with results of calorie count as data is available.   1/12 Calorie count- results from 4 meals. Breakfast -100% + 4 oz choco ice cream Lunch-100% + 4 oz sherbet Dinner-100% (nectar-tea and magic cup) Breakfast-75% + 50% magic cup   Total estimated calories/ protein -2240 kcal/ 108 gr Average calorie/protein intake per meal- 560 kcal, 27 gr protein  He is meeting ~ 88% energy and 81% protein needs based on current intake.  Weights reviewed. Last weight 12/20-59.4 kg. Will request new weight for assessment. Current weight obtained 49.5 kg which reflects severe loss of 10 kg (17%) < 1 month.   Medications: reviewed.      Latest Ref Rng & Units 09/27/2022    4:22 AM 09/26/2022    3:37 AM 09/25/2022    3:37 AM  BMP  Glucose 70 - 99 mg/dL 108  86  81   BUN 8 - 23 mg/dL 13  15  14    Creatinine 0.61 - 1.24 mg/dL 0.72  0.81  0.68   Sodium 135 - 145 mmol/L 139  135  136   Potassium 3.5 - 5.1 mmol/L 3.8  4.3  4.3   Chloride 98 - 111 mmol/L 102  98  100   CO2 22 - 32 mmol/L 27  24  27    Calcium 8.9 - 10.3 mg/dL 9.1  9.3  9.2         Diet Order:   Diet Order  DIET DYS 2 Room service appropriate? Yes; Fluid consistency: Nectar Thick  Diet effective now                   EDUCATION NEEDS:  Not appropriate for education at this time  Skin:  Skin Assessment: Reviewed RN Assessment  Last BM:  1/11 type 2 large  Height:   Ht Readings from Last 1 Encounters:  09/05/22 5\' 8"  (1.727 m)    Weight:   Wt Readings from Last 1 Encounters:  09/28/22 47.8 kg    Ideal Body Weight:   70 kg  BMI:  Body mass index is 16.02 kg/m.  Estimated Nutritional Needs:   Kcal:  1900-2100  Protein:  100-105 gr  Fluid:  1800 ml daily  Colman Cater MS,RD,CSG,LDN Contact: Shea Evans

## 2022-09-28 NOTE — TOC Progression Note (Signed)
Transition of Care [facility]) - Progression Note    Patient Details  Name: [name] MRN: [id number] Date of Birth: Feb 12, 1961  Transition of Care [facility]) CM/SW Contact  Shade Flood, LCSW Phone Number: 09/28/2022, 10:39 AM  Clinical Narrative:     TOC following. DSS here this AM to see patient and obtain medical records for possible court process for guardianship.   Will follow.  Expected Discharge Plan: [place] Barriers to Discharge: Continued Medical Work up  Expected Discharge Plan and Services In-house Referral: Clinical Social Work   Post Acute Care Choice: [place] Living arrangements for the past 2 months: [place] Determinants of Health (SDOH) Interventions SDOH Screenings   Tobacco Use: Medium Risk (09/06/2022)    Readmission Risk Interventions    09/07/2022   12:35 PM  Readmission Risk Prevention Plan  Medication Screening Complete  Transportation Screening Complete

## 2022-09-28 NOTE — TOC Progression Note (Signed)
Transition of Care [facility]) - Progression Note    Patient Details  Name: [name] MRN: [id number] Date of Birth: February 24, 1961  Transition of Care [facility]) CM/SW Contact  Shade Flood, LCSW Phone Number: 09/28/2022, 4:00 PM  Clinical Narrative:     DSS CSW, Chance Cecille Rubin, is now guardian for patient. Phone for Chance is [phone]. Chance agreeable to SNF bed search for LTC and DSS will start LTC Medicaid application. TOC will assist with LOG for LTC if needed. Referrals sent out as requested by Chance.  Will follow.  Expected Discharge Plan: [place] Barriers to Discharge: Continued Medical Work up  Expected Discharge Plan and Services In-house Referral: Clinical Social Work   Post Acute Care Choice: [place] Living arrangements for the past 2 months: [place] Determinants of Health (SDOH) Interventions SDOH Screenings   Tobacco Use: Medium Risk (09/06/2022)    Readmission Risk Interventions    09/07/2022   12:35 PM  Readmission Risk Prevention Plan  Medication Screening Complete  Transportation Screening Complete

## 2022-09-28 NOTE — NC FL2 (Signed)
[place] LEVEL OF CARE FORM     IDENTIFICATION  Patient Name: [name] Birthdate: Jul 04, 1961 Sex: male Admission Date (Current Location): 09/05/2022  [facility] and [place] Number:  [employer] and Address:  [place] [address], [place]      Provider Number: [id number]  Attending Physician Name and Address:  Rodena Goldmann, DO  Relative Name and Phone Number:       Current Level of Care: Hospital Recommended Level of Care: [place] Prior Approval Number:    Date Approved/Denied:   PASRR Number:    Discharge Plan: SNF    Current Diagnoses: Patient Active Problem List   Diagnosis Date Noted   Protein-calorie malnutrition, severe 09/21/2022   Severe/Advanced Dementia with behavioral disturbance ([place]) 09/15/2022   FTT (failure to thrive) in adult 09/15/2022   Goals of care, counseling/discussion 09/10/2022   Hypernatremia 09/08/2022   Prolonged QT interval 09/06/2022   Dysphagia 09/06/2022   Lithium toxicity 67/34/1937   Acute metabolic encephalopathy 90/24/0973   Primary osteoarthritis of left knee 04/17/2022   Chronic pain of left knee 02/03/2019   Primary osteoarthritis of right knee 08/11/2018   Total knee replacement status 08/11/2018   Seasonal allergies 12/23/2014   BMI 28.0-28.9,adult 12/18/2014   External hemorrhoid, bleeding 08/20/2013   Bipolar 1 disorder ([place]) 06/17/2013   Hyperlipidemia 06/17/2013   Eczema 06/17/2013    Orientation RESPIRATION BLADDER Height & Weight     Self  Normal Incontinent Weight: 105 lb 6.1 oz (47.8 kg) Height:  5\' 8"  (172.7 cm)  BEHAVIORAL SYMPTOMS/MOOD NEUROLOGICAL BOWEL NUTRITION STATUS      Incontinent Diet (see dc summary)  AMBULATORY STATUS COMMUNICATION OF NEEDS Skin   Total Care Does not communicate Normal                       Personal Care Assistance Level of Assistance  Bathing, Feeding, Dressing Bathing Assistance: Maximum  assistance Feeding assistance: Maximum assistance Dressing Assistance: Maximum assistance     Functional Limitations Info  Sight, Hearing, Speech Sight Info: Adequate Hearing Info: Adequate Speech Info: Impaired    SPECIAL CARE FACTORS FREQUENCY  PT (By licensed PT), OT (By licensed OT)     PT Frequency: No PT OT Frequency: No OT            Contractures Contractures Info: Not present    Additional Factors Info  Code Status, Allergies, Psychotropic Code Status Info: Full Allergies Info: NKA Psychotropic Info: Lithium         Current Medications (09/28/2022):  This is the current hospital active medication list Current Facility-Administered Medications  Medication Dose Route Frequency Provider Last Rate Last Admin   acetaminophen (TYLENOL) tablet 650 mg  650 mg Oral Q4H PRN Zierle-Ghosh, Asia B, DO   650 mg at 09/27/22 1615   Or   acetaminophen (TYLENOL) 160 MG/5ML solution 650 mg  650 mg Per Tube Q4H PRN Zierle-Ghosh, Asia B, DO       Or   acetaminophen (TYLENOL) suppository 650 mg  650 mg Rectal Q4H PRN Zierle-Ghosh, Asia B, DO       albuterol (PROVENTIL) (2.5 MG/3ML) 0.083% nebulizer solution 2.5 mg  2.5 mg Nebulization Q4H PRN Zierle-Ghosh, Asia B, DO   2.5 mg at 09/23/22 0236   amphetamine-dextroamphetamine (ADDERALL) tablet 10 mg  10 mg Oral Q breakfast Shahmehdi, Seyed A, MD   10 mg at 09/28/22 0931   benztropine (COGENTIN)  tablet 0.5 mg  0.5 mg Oral BID Shahmehdi, Seyed A, MD   0.5 mg at 09/28/22 2263   Or   benztropine mesylate (COGENTIN) injection 0.5 mg  0.5 mg Intramuscular BID Shahmehdi, Seyed A, MD   0.5 mg at 09/23/22 2152   dextrose 5 %-0.9 % sodium chloride infusion   Intravenous Continuous Manuella Ghazi, Pratik D, DO   Stopped at 09/28/22 0524   divalproex (DEPAKOTE SPRINKLE) capsule 250 mg  250 mg Oral Q8H Shah, Pratik D, DO   250 mg at 09/28/22 1503   enoxaparin (LOVENOX) injection 40 mg  40 mg Subcutaneous Q24H Shahmehdi, Seyed A, MD   40 mg at 09/28/22 0932    feeding supplement (OSMOLITE 1.2 CAL) liquid 1,000 mL  1,000 mL Per Tube Continuous Zierle-Ghosh, Asia B, DO 70 mL/hr at 09/28/22 0541 Infusion Verify at 33/54/56 2563   folic acid (FOLVITE) tablet 1 mg  1 mg Oral Daily Shahmehdi, Seyed A, MD   1 mg at 09/28/22 0931   guaiFENesin (ROBITUSSIN) 100 MG/5ML liquid 5 mL  5 mL Oral Q4H PRN Zierle-Ghosh, Asia B, DO   5 mL at 09/23/22 0314   haloperidol lactate (HALDOL) injection 2 mg  2 mg Intravenous Q6H PRN Dwyane Dee, MD   2 mg at 09/28/22 0932   LORazepam (ATIVAN) injection 2 mg  2 mg Intravenous Q8H PRN Shahmehdi, Seyed A, MD   2 mg at 09/28/22 0445   megestrol (MEGACE) 400 MG/10ML suspension 200 mg  200 mg Oral BID Manuella Ghazi, Pratik D, DO   200 mg at 09/28/22 0931   nystatin (MYCOSTATIN/NYSTOP) topical powder   Topical BID Roxan Hockey, MD   Given at 09/28/22 0932     Discharge Medications: Please see discharge summary for a list of discharge medications.  Relevant Imaging Results:  Relevant Lab Results:   Additional Information SSN: [id number]  Shade Flood, LCSW

## 2022-09-28 NOTE — Progress Notes (Signed)
Patient has continued to be restless during this shift. Patient was redirected several time throwing his leg over the side rail and attempting to get out of bed.  Three prn medications given. See MAR. Patient did intake a small amount of thicken liquids. Tolerated well. Patient is currently resting in his bed at this time. Sitter at bedside.

## 2022-09-28 NOTE — Progress Notes (Signed)
PROGRESS NOTE    [name]  EVO:[id number] DOB: June 08, 1961 DOA: 09/05/2022 PCP: [facility]   Brief Narrative:    [name] is a 62 yo male with PMH dementia, bipolar 1 disorder, HLD, HTN, CAD, CVA who presented with confusion and dysphagia. With further workup he was also found to be agitated/impulsive, and unable to be redirected.  He required Haldol and Ativan.  His significant other was concerned about his inability to swallow and he was brought for further workup. Because of the difficulty eating/dysphagia, he was initially initiated on a stroke workup.  However, further workup also was notable for an elevated lithium level.  Given his presenting symptoms, it was felt that they were attributed to lithium toxicity.  Poison control was called and patient was started on fluids and admitted. Despite aggressive measures, he showed little improvement during hospitalization.  Given his poor physical reserve and declining functional status leading up to hospitalization, he appeared to be more consistent with failure to thrive.  Goals of care discussions were held with his significant other who is his main caretaker and decision maker.  He was made DNR and continued on medical treatment in the hopes for some improvement however his prognosis was made clear that it appears to be worsening daily.  Palliative continues to have ongoing discussions with the girlfriend and ethics is involved.    09/18/22 -Had face-to-face Conference with patient's girlfriend on 09/13/2022 -Patient girlfriend requested that I do Not call her to talk to her about patient's poor prognosis anymore, because it makes her sad.- -Patient's girlfriend is accusing the medical team of "prematurely giving up on patient" --She tells me that she is not ready to make any decisions about possible de-escalation of care until the middle of January at the earliest -oral intake remains very  poor -Hypoglycemia with blood sugar of 69 noted despite continuous IV dextrose drip-- -will increase IV dextrose rate -Overall prognosis remains poor given very poor oral intake due to persistent metabolic encephalopathy superimposed on advanced dementia   09/19/22 -Remain calm comfortable, remained calm, comfortable, sleepy -Refused to eat or to be fed   09/20/2022 -Agitated confused overnight, Ativan and Haldol was used overnight -Appreciate palliative care following, and consulted ethics committee Current recommendation is neuroconsultation, attempt of Dobbhoff placement and initiation of tube feeding Fails we will proceed with comfort care     09/21/2022 -Some, minimal interaction between him and the nursing staff noted this morning with some oral intake -Plan remains for Dobbhoff placement per ethics committee recommendations Nutrition to be consulted for tube feeds -Psych has been consulted for evaluation -Pending neuro eval   09/22/2022 -Patient received Dobbhoff feeding tube placement by IR on 09/21/2022 -Nutrition has started tube feeding -Neurology has evaluated the patient-looking for reversible causes -Agitated overnight received Ativan around 3 AM Unresponsive non-interactive this a.m.   09/23/2022 -Patient was seen and examined -Agitated earlier per nursing staff -Dobbhoff tube still in place, tube feeds,     09/24/2022 -A bit more responsive to the nursing staff at bedside was encouraging oral intake -Drop-offs still present -tolerating tube feeds     09/25/2022 -Opens his eyes but does not follow command, nonverbal, noncommunicating -Per nursing staff agitated earlier this morning, has had p.o. intake for first time today -Dobbhoff feeding tube still in place   09/26/22 -Patient is nonverbal and appears to have pulled out feeding tube on 1/9 -Appears to tolerating some oral intake -Trial of lactulose today due  to elevated ammonia levels -Depakote recently increased  by behavioral health -Trial of Megace to see if this helps to improve appetite -Continue folate repletion -Appears to be having more of a failure to thrive picture and is appropriate for hospice  09/27/22 -Calorie count initiated to help see if pt meeting caloric needs -As per Neurology note, patient is an appropriate candidate for hospice. Since next of kin cannot be contacted at this point, attempts will be made to transfer to facility for hospice care.   09/28/22 -DSS evaluating for emergency guardianship at which point patient can be discharged once a facility has been obtained.  Assessment & Plan:   Principal Problem:   Acute metabolic encephalopathy Active Problems:   Bipolar 1 disorder (HCC)   Dysphagia   Lithium toxicity   Goals of care, counseling/discussion   Severe/Advanced Dementia with behavioral disturbance (HCC)   FTT (failure to thrive) in adult   Hypernatremia   Hyperlipidemia   Prolonged QT interval   Protein-calorie malnutrition, severe  Assessment and Plan:   1)Lithium Toxicity Resolved   - Lithium level 1.51 on admission -- lithium level normalized and dropped -No change in mentation,-not interactive, unresponsive   -Status post psych/TTS evaluation - appreciate tele-psych rec's; he has been taken off lithium and started on depakote; continue p.o. for now and if necessary,  transitioned to IV   - [name]: Cachectic, unresponsive, not interactive, poor p.o. intake - poor QOL with decline as evidenced by severe cachexia;  -  DNR -palliative following     2) Advanced dementia with significant cognitive and memory deficits-encephalopathic -progressive decline-in setting of dementia   -Neurology consulted: Reviewing and evaluating for reversible causes-unlikely change in mentation at this point -Psych/CTS evaluation, adjusting medication - unlikely to reverse his condition at this point   -patient significant other [name]  recalls that the  patient has had significant memory and cognitive decline over the last several years -Overall 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 -The last time he was able to put a couple words together in a phrase was apparently around June 2023 when he said that the garage where he used to do his Dealer work was on [address] -otherwise over the last 12 months patient may answer with one-word answers, significant cognitive decline -Patient lost over 40 pounds of weight due to poor oral intake -Recount initiated on 1/11     3)Goals of care, counseling/discussion - Long conversation held with [facility] on 09/10/2022 and again on 09/13/2022, September 19, 2022   -Ongoing discussion daily with palliative care team -Ethic committee consulted, ongoing discussion For committee and Ms. Verner Chol -recommended trial of Dobbhoff feeding tube placement and tube feeding -      -No improvement-minimally interactive, impulsive,-continue to pull out IVs telemetry lines -As needed Ativan, Haldol -No PO nutritional intake -IV fluid limited as he is pulling the line out   -Palliative care team, and ethics committee consulted: Recommended: Attempt Dobbhoff tube and tube feeds if fails progress to comfort care Recommended Neuro consultation   - patient has been hospitalized for days at that point and he has shown no signs of improvement.  He remains minimally interactive and impulsive, constantly trying to climb out of bed.  He has pulled IV out as well  as telemetry off repetitively.   -He also has had extremely minimal nutritional intake since admission.    -WE do not find an to be a good candidate for G- tube placement as he will inevitably pull this out as well and PEG tube placement also considered medically futile and inappropriate  in context of his multiple comorbidities, poor quality of life, and progressive functional decline   - Discussed with Barbaraann Share that he does not appear to have the physical reserve to overcome the events that have led up to this hospitalization.  He may also not survive this hospitalization.       - [name] agrees with DNR status at this time. She does wish for ongoing attempts/treatment to see if he improves, but should he continue to decline (which I expect), then a natural death will be allowed. He is currently not a rehab candidate unless were to show tremendous improvement and rather he appears more appropriate for inpatient vs residential hospice depending on course of events over the next few days -Overall prognosis remains poor/grave -Family contact--   Richardean Chimera [phone] -As per [name] .... No living family members.  -Palliative working on contacting Ms. Scales       4)Hypernatremia -resolved   5)Acute metabolic encephalopathy in setting of baseline advanced/severe dementia with significant cognitive and memory deficits at baseline - Etiology attributed to probable lithium toxicity - CT head unremarkable for acute findings.  Chronic left frontal lobe infarct appreciated -MRI brain also negative for acute stroke -Patient is reported to have some altered mentation at baseline as well; per girlfriend,  he is mostly nonverbal at baseline----please see #2 above -Continue sitter -Continue assist of nursing staff -Trial of lactulose for elevated ammonia level -Continue folate repletion -Depakote as adjusted per behavioral health   6)Dysphagia -Multifactorial including lithium toxicity, psychiatric disorder refusing oral intake -First oral intake 09/25/2022   -Per ethic committee trial of Dobbhoff tube place and enteric feeding if it fails we will proceed with comfort care   - Recent EGD June 2023, cannot see results, but per GI had gastritis and Candida esophagitis -  unfortunately no signs of improvement still - see GOC - evaluated by SLP - continue offering diet as able; poor candidate for NGT and PEG candidate; not appropriate for TPN (No clear goals for long-term solution) -Pulled out Dobbhoff tube     Leukocytosis -likely reactive-resolved -Chest x-ray on 09/17/2022 without acute findings -Get blood cultures, check UA -Hold off on antibiotics   Prolonged QT interval - Likely related to elevated lithium level - patient pulling off tele too much for meaning capture; okay to d/c - serial EKGs as he allows too - Hold QT prolonging agents when possible   Hyperlipidemia -discontinued statin, risk outweighs the benefit for this patient   Bipolar 1 disorder ([place]) - Discontinued lithium, per psych recommendation on Cogentin, IV Depakote - haldol / ativan PRN   Contamination of blood culture-resolved as of 09/09/2022 - 1/4 bottles noted with GPC from 12/20 but apparently nothing seen on media per micro. Has remained afebrile with minimal leukocytosis (considered reactive from other processes) - Will Repeat blood culture if becomes febrile      DVT prophylaxis:Lovenox Code Status: Full Family Communication: Tried calling significant other with no response 1/12 Disposition Plan:  Status is: Inpatient Remains inpatient appropriate because: Need for ongoing close monitoring and IV medications.   Consultants:  Palliative Neurology Behavioral health Ethics  Procedures:  None  Antimicrobials:  Anti-infectives (  From admission, onward)    Start     Dose/Rate Route Frequency Ordered Stop   09/06/22 0900  voriconazole (VFEND) tablet 200 mg  Status:  Discontinued        200 mg Oral  Once 09/05/22 1941 09/07/22 0815       Subjective: Patient seen and evaluated today and is nonverbal.  He tolerates minimal oral intake.  Girlfriend could not be contacted.  Objective: Vitals:   09/27/22 2013 09/27/22 2341 09/28/22 0322 09/28/22 0641  BP:  (!) 88/56 (!) 107/59 107/79   Pulse: 64  (!) 59   Resp: 18  14   Temp: (!) 97.5 F (36.4 C)  97.8 F (36.6 C)   TempSrc:   Oral   SpO2:   100%   Weight:    47.8 kg  Height:        Intake/Output Summary (Last 24 hours) at 09/28/2022 1031 Last data filed at 09/28/2022 0541 Gross per 24 hour  Intake 1924.98 ml  Output --  Net 1924.98 ml   Filed Weights   09/26/22 0500 09/27/22 0500 09/28/22 0641  Weight: 49.9 kg 51.3 kg 47.8 kg    Examination:  General exam: Appears calm and comfortable, nonverbal Respiratory system: Clear to auscultation. Respiratory effort normal. Cardiovascular system: S1 & S2 heard, RRR.  Gastrointestinal system: Abdomen is soft Central nervous system: Alert and awake Extremities: No edema Skin: No significant lesions noted    Data Reviewed: I have personally reviewed following labs and imaging studies  CBC: Recent Labs  Lab 09/21/22 1243 09/22/22 0837 09/27/22 0422  WBC 3.3* 3.0* 4.6  NEUTROABS  --  1.2*  --   HGB 13.5 12.2* 14.0  HCT 44.9 39.3 47.1  MCV 79.5* 78.3* 80.2  PLT 168 177 179   Basic Metabolic Panel: Recent Labs  Lab 09/22/22 0547 09/23/22 0459 09/24/22 0734 09/25/22 0337 09/26/22 0337 09/27/22 0422  NA  --  136 134* 136 135 139  K  --  4.2 4.6 4.3 4.3 3.8  CL  --  101 100 100 98 102  CO2  --  26 25 27 24 27   GLUCOSE  --  99 84 81 86 108*  BUN  --  9 10 14 15 13   CREATININE  --  0.67 0.69 0.68 0.81 0.72  CALCIUM  --  9.1 9.1 9.2 9.3 9.1  MG 1.5* 1.8  --   --  1.9 1.8   GFR: Estimated Creatinine Clearance: 64.7 mL/min (by C-G formula based on SCr of 0.72 mg/dL). Liver Function Tests: Recent Labs  Lab 09/23/22 0459 09/24/22 0734 09/25/22 0337 09/26/22 0337 09/27/22 0422  AST 26 25 76* 73* 55*  ALT 22 20 63* 82* 68*  ALKPHOS 68 68 74 75 67  BILITOT 0.6 0.2* 0.3 <0.1* 0.4  PROT 6.6 6.6 6.9 7.3 7.0  ALBUMIN 2.9* 2.9* 3.0* 3.1* 2.9*   No results for input(s): "LIPASE", "AMYLASE" in the last 168  hours. Recent Labs  Lab 09/22/22 0547 09/24/22 0734 09/25/22 0337 09/27/22 0628  AMMONIA 43* 37* 49* 41*   Coagulation Profile: No results for input(s): "INR", "PROTIME" in the last 168 hours. Cardiac Enzymes: No results for input(s): "CKTOTAL", "CKMB", "CKMBINDEX", "TROPONINI" in the last 168 hours. BNP (last 3 results) No results for input(s): "PROBNP" in the last 8760 hours. HbA1C: No results for input(s): "HGBA1C" in the last 72 hours. CBG: Recent Labs  Lab 09/24/22 0753 09/24/22 1420 09/25/22 1153 09/25/22 1817 09/26/22 0132  GLUCAP 91 114*  91 116* 106*   Lipid Profile: No results for input(s): "CHOL", "HDL", "LDLCALC", "TRIG", "CHOLHDL", "LDLDIRECT" in the last 72 hours. Thyroid Function Tests: No results for input(s): "TSH", "T4TOTAL", "FREET4", "T3FREE", "THYROIDAB" in the last 72 hours. Anemia Panel: No results for input(s): "VITAMINB12", "FOLATE", "FERRITIN", "TIBC", "IRON", "RETICCTPCT" in the last 72 hours. Sepsis Labs: No results for input(s): "PROCALCITON", "LATICACIDVEN" in the last 168 hours.  No results found for this or any previous visit (from the past 240 hour(s)).       Radiology Studies: No results found.      Scheduled Meds:  amphetamine-dextroamphetamine  10 mg Oral Q breakfast   benztropine  0.5 mg Oral BID   Or   benztropine mesylate  0.5 mg Intramuscular BID   divalproex  250 mg Oral Q8H   enoxaparin (LOVENOX) injection  40 mg Subcutaneous Q24H   folic acid  1 mg Oral Daily   megestrol  200 mg Oral BID   nystatin   Topical BID   Continuous Infusions:  dextrose 5 % and 0.9% NaCl Stopped (09/28/22 0524)   feeding supplement (OSMOLITE 1.2 CAL) 70 mL/hr at 09/28/22 0541     LOS: 22 days    Time spent: 35 minutes    Brodi Nery Hoover Brunette, DO [facility]  If 7PM-7AM, please contact night-coverage [email or web address] 09/28/2022, 10:32 AM

## 2022-09-29 DIAGNOSIS — G9341 Metabolic encephalopathy: Secondary | ICD-10-CM | POA: Diagnosis not present

## 2022-09-29 NOTE — Progress Notes (Signed)
PROGRESS NOTE    [name]  EVO:[id number] DOB: June 08, 1961 DOA: 09/05/2022 PCP: [facility]   Brief Narrative:    [name] is a 62 yo male with PMH dementia, bipolar 1 disorder, HLD, HTN, CAD, CVA who presented with confusion and dysphagia. With further workup he was also found to be agitated/impulsive, and unable to be redirected.  He required Haldol and Ativan.  His significant other was concerned about his inability to swallow and he was brought for further workup. Because of the difficulty eating/dysphagia, he was initially initiated on a stroke workup.  However, further workup also was notable for an elevated lithium level.  Given his presenting symptoms, it was felt that they were attributed to lithium toxicity.  Poison control was called and patient was started on fluids and admitted. Despite aggressive measures, he showed little improvement during hospitalization.  Given his poor physical reserve and declining functional status leading up to hospitalization, he appeared to be more consistent with failure to thrive.  Goals of care discussions were held with his significant other who is his main caretaker and decision maker.  He was made DNR and continued on medical treatment in the hopes for some improvement however his prognosis was made clear that it appears to be worsening daily.  Palliative continues to have ongoing discussions with the girlfriend and ethics is involved.    09/18/22 -Had face-to-face Conference with patient's girlfriend on 09/13/2022 -Patient girlfriend requested that I do Not call her to talk to her about patient's poor prognosis anymore, because it makes her sad.- -Patient's girlfriend is accusing the medical team of "prematurely giving up on patient" --She tells me that she is not ready to make any decisions about possible de-escalation of care until the middle of January at the earliest -oral intake remains very  poor -Hypoglycemia with blood sugar of 69 noted despite continuous IV dextrose drip-- -will increase IV dextrose rate -Overall prognosis remains poor given very poor oral intake due to persistent metabolic encephalopathy superimposed on advanced dementia   09/19/22 -Remain calm comfortable, remained calm, comfortable, sleepy -Refused to eat or to be fed   09/20/2022 -Agitated confused overnight, Ativan and Haldol was used overnight -Appreciate palliative care following, and consulted ethics committee Current recommendation is neuroconsultation, attempt of [name] placement and initiation of tube feeding Fails we will proceed with comfort care     09/21/2022 -Some, minimal interaction between him and the nursing staff noted this morning with some oral intake -Plan remains for [name] placement per ethics committee recommendations Nutrition to be consulted for tube feeds -Psych has been consulted for evaluation -Pending neuro eval   09/22/2022 -Patient received [name] feeding tube placement by IR on 09/21/2022 -Nutrition has started tube feeding -Neurology has evaluated the patient-looking for reversible causes -Agitated overnight received Ativan around 3 AM Unresponsive non-interactive this a.m.   09/23/2022 -Patient was seen and examined -Agitated earlier per nursing staff -[name] tube still in place, tube feeds,     09/24/2022 -A bit more responsive to the nursing staff at bedside was encouraging oral intake -Drop-offs still present -tolerating tube feeds     09/25/2022 -Opens his eyes but does not follow command, nonverbal, noncommunicating -Per nursing staff agitated earlier this morning, has had p.o. intake for first time today -[name] feeding tube still in place   09/26/22 -Patient is nonverbal and appears to have pulled out feeding tube on 1/9 -Appears to tolerating some oral intake -Trial of lactulose today due  to elevated ammonia levels -Depakote recently increased  by behavioral health -Trial of Megace to see if this helps to improve appetite -Continue folate repletion -Appears to be having more of a failure to thrive picture and is appropriate for hospice  09/27/22 -Calorie count initiated to help see if pt meeting caloric needs -As per Neurology note, patient is an appropriate candidate for hospice. Since next of kin cannot be contacted at this point, attempts will be made to transfer to facility for hospice care.   09/28/22-09/29/22 -DSS evaluating for emergency guardianship at which point patient can be discharged once a facility has been obtained.  Assessment & Plan:   Principal Problem:   Acute metabolic encephalopathy Active Problems:   Bipolar 1 disorder (HCC)   Dysphagia   Lithium toxicity   Goals of care, counseling/discussion   Severe/Advanced Dementia with behavioral disturbance (HCC)   FTT (failure to thrive) in adult   Hypernatremia   Hyperlipidemia   Prolonged QT interval   Protein-calorie malnutrition, severe  Assessment and Plan:   1)Lithium Toxicity Resolved   - Lithium level 1.51 on admission -- lithium level normalized and dropped -No change in mentation,-not interactive, unresponsive   -Status post psych/TTS evaluation - appreciate tele-psych rec's; he has been taken off lithium and started on depakote; continue p.o. for now and if necessary,  transitioned to IV   - [name]: [name], unresponsive, not interactive, poor p.o. intake - poor QOL with decline as evidenced by severe cachexia;  -  DNR -palliative following     2) Advanced dementia with significant cognitive and memory deficits-encephalopathic -progressive decline-in setting of dementia   -Neurology consulted: Reviewing and evaluating for reversible causes-unlikely change in mentation at this point -Psych/CTS evaluation, adjusting medication - unlikely to reverse his condition at this point   -patient significant other [name]  recalls that  the patient has had significant memory and cognitive decline over the last several years -Overall 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 -The last time he was able to put a couple words together in a phrase was apparently around June 2023 when he said that the garage where he used to do his Dealer work was on [address] -otherwise over the last 12 months patient may answer with one-word answers, significant cognitive decline -Patient lost over 40 pounds of weight due to poor oral intake -Recount initiated on 1/11     3)Goals of care, counseling/discussion - Long conversation held with [place] on 09/10/2022 and again on 09/13/2022, September 19, 2022   -Ongoing discussion daily with palliative care team -Ethic committee consulted, ongoing discussion For committee and Ms. Verner Chol -recommended trial of [name] feeding tube placement and tube feeding -      -No improvement-minimally interactive, impulsive,-continue to pull out IVs telemetry lines -As needed Ativan, Haldol -No PO nutritional intake -IV fluid limited as he is pulling the line out   -Palliative care team, and ethics committee consulted: Recommended: Attempt [name] tube and tube feeds if fails progress to comfort care Recommended Neuro consultation   - patient has been hospitalized for days at that point and he has shown no signs of improvement.  He remains minimally interactive and impulsive, constantly trying to climb out of bed.  He has pulled IV out as well  as telemetry off repetitively.   -He also has had extremely minimal nutritional intake since admission.    -WE do not find an to be a good candidate for G- tube placement as he will inevitably pull this out as well and PEG tube placement also considered medically futile and  inappropriate in context of his multiple comorbidities, poor quality of life, and progressive functional decline   - Discussed with Barbaraann Share that he does not appear to have the physical reserve to overcome the events that have led up to this hospitalization.  He may also not survive this hospitalization.       - [name] agrees with DNR status at this time. She does wish for ongoing attempts/treatment to see if he improves, but should he continue to decline (which I expect), then a natural death will be allowed. He is currently not a rehab candidate unless were to show tremendous improvement and rather he appears more appropriate for inpatient vs residential hospice depending on course of events over the next few days -Overall prognosis remains poor/grave -Family contact--   Richardean Chimera [phone] -As per [name] .... No living family members.  -Palliative working on contacting Ms. Scales       4)Hypernatremia -resolved   5)Acute metabolic encephalopathy in setting of baseline advanced/severe dementia with significant cognitive and memory deficits at baseline - Etiology attributed to probable lithium toxicity - CT head unremarkable for acute findings.  Chronic left frontal lobe infarct appreciated -MRI brain also negative for acute stroke -Patient is reported to have some altered mentation at baseline as well; per girlfriend,  he is mostly nonverbal at baseline----please see #2 above -Continue sitter -Continue assist of nursing staff -Trial of lactulose for elevated ammonia level -Continue folate repletion -Depakote as adjusted per behavioral health   6)Dysphagia -Multifactorial including lithium toxicity, psychiatric disorder refusing oral intake -First oral intake 09/25/2022   -Per ethic committee trial of [name] tube place and enteric feeding if it fails we will proceed with comfort care   - Recent EGD June 2023, cannot see results, but per GI had gastritis and Candida  esophagitis - unfortunately no signs of improvement still - see GOC - evaluated by SLP - continue offering diet as able; poor candidate for NGT and PEG candidate; not appropriate for TPN (No clear goals for long-term solution) -Pulled out [name] tube     Leukocytosis -likely reactive-resolved -Chest x-ray on 09/17/2022 without acute findings -Get blood cultures, check UA -Hold off on antibiotics   Prolonged QT interval - Likely related to elevated lithium level - patient pulling off tele too much for meaning capture; okay to d/c - serial EKGs as he allows too - Hold QT prolonging agents when possible   Hyperlipidemia -discontinued statin, risk outweighs the benefit for this patient   Bipolar 1 disorder ([place]) - Discontinued lithium, per psych recommendation on Cogentin, IV Depakote - haldol / ativan PRN   Contamination of blood culture-resolved as of 09/09/2022 - 1/4 bottles noted with GPC from 12/20 but apparently nothing seen on media per micro. Has remained afebrile with minimal leukocytosis (considered reactive from other processes) - Will Repeat blood culture if becomes febrile      DVT prophylaxis:Lovenox Code Status: Full Family Communication: Tried calling significant other with no response 1/12 Disposition Plan:  Status is: Inpatient Remains inpatient appropriate because: Need for ongoing close monitoring and IV medications.   Consultants:  Palliative Neurology Behavioral health Ethics  Procedures:  None  Antimicrobials:  Anti-infectives (  From admission, onward)    Start     Dose/Rate Route Frequency Ordered Stop   09/06/22 0900  voriconazole (VFEND) tablet 200 mg  Status:  Discontinued        200 mg Oral  Once 09/05/22 1941 09/07/22 0815       Subjective: Patient seen and evaluated today and is nonverbal.  He tolerates minimal oral intake.  Girlfriend could not be contacted.  Objective: Vitals:   09/28/22 0641 09/28/22 1330 09/28/22 2027  09/29/22 0500  BP:  110/72 113/66   Pulse:  62 77   Resp:  15 16   Temp:  97.8 F (36.6 C) 97.9 F (36.6 C)   TempSrc:  Oral Axillary   SpO2:  100% 100%   Weight: 47.8 kg   47.1 kg  Height:        Intake/Output Summary (Last 24 hours) at 09/29/2022 0948 Last data filed at 09/28/2022 1400 Gross per 24 hour  Intake 60 ml  Output --  Net 60 ml   Filed Weights   09/27/22 0500 09/28/22 0641 09/29/22 0500  Weight: 51.3 kg 47.8 kg 47.1 kg    Examination:  General exam: Appears calm and comfortable, nonverbal Respiratory system: Clear to auscultation. Respiratory effort normal. Cardiovascular system: S1 & S2 heard, RRR.  Gastrointestinal system: Abdomen is soft Central nervous system: Alert and awake Extremities: No edema Skin: No significant lesions noted    Data Reviewed: I have personally reviewed following labs and imaging studies  CBC: Recent Labs  Lab 09/27/22 0422  WBC 4.6  HGB 14.0  HCT 47.1  MCV 80.2  PLT 179   Basic Metabolic Panel: Recent Labs  Lab 09/23/22 0459 09/24/22 0734 09/25/22 0337 09/26/22 0337 09/27/22 0422  NA 136 134* 136 135 139  K 4.2 4.6 4.3 4.3 3.8  CL 101 100 100 98 102  CO2 26 25 27 24 27   GLUCOSE 99 84 81 86 108*  BUN 9 10 14 15 13   CREATININE 0.67 0.69 0.68 0.81 0.72  CALCIUM 9.1 9.1 9.2 9.3 9.1  MG 1.8  --   --  1.9 1.8   GFR: Estimated Creatinine Clearance: 63.8 mL/min (by C-G formula based on SCr of 0.72 mg/dL). Liver Function Tests: Recent Labs  Lab 09/23/22 0459 09/24/22 0734 09/25/22 0337 09/26/22 0337 09/27/22 0422  AST 26 25 76* 73* 55*  ALT 22 20 63* 82* 68*  ALKPHOS 68 68 74 75 67  BILITOT 0.6 0.2* 0.3 <0.1* 0.4  PROT 6.6 6.6 6.9 7.3 7.0  ALBUMIN 2.9* 2.9* 3.0* 3.1* 2.9*   No results for input(s): "LIPASE", "AMYLASE" in the last 168 hours. Recent Labs  Lab 09/24/22 0734 09/25/22 0337 09/27/22 0628  AMMONIA 37* 49* 41*   Coagulation Profile: No results for input(s): "INR", "PROTIME" in the last  168 hours. Cardiac Enzymes: No results for input(s): "CKTOTAL", "CKMB", "CKMBINDEX", "TROPONINI" in the last 168 hours. BNP (last 3 results) No results for input(s): "PROBNP" in the last 8760 hours. HbA1C: No results for input(s): "HGBA1C" in the last 72 hours. CBG: Recent Labs  Lab 09/24/22 0753 09/24/22 1420 09/25/22 1153 09/25/22 1817 09/26/22 0132  GLUCAP 91 114* 91 116* 106*   Lipid Profile: No results for input(s): "CHOL", "HDL", "LDLCALC", "TRIG", "CHOLHDL", "LDLDIRECT" in the last 72 hours. Thyroid Function Tests: No results for input(s): "TSH", "T4TOTAL", "FREET4", "T3FREE", "THYROIDAB" in the last 72 hours. Anemia Panel: No results for input(s): "VITAMINB12", "FOLATE", "FERRITIN", "TIBC", "IRON", "RETICCTPCT" in the last 72 hours.  Sepsis Labs: No results for input(s): "PROCALCITON", "LATICACIDVEN" in the last 168 hours.  No results found for this or any previous visit (from the past 240 hour(s)).       Radiology Studies: No results found.      Scheduled Meds:  amphetamine-dextroamphetamine  10 mg Oral Q breakfast   benztropine  0.5 mg Oral BID   Or   benztropine mesylate  0.5 mg Intramuscular BID   divalproex  250 mg Oral Q8H   enoxaparin (LOVENOX) injection  40 mg Subcutaneous [id number]   folic acid  1 mg Oral Daily   megestrol  200 mg Oral BID   nystatin   Topical BID   Continuous Infusions:  dextrose 5 % and 0.9% NaCl 75 mL/hr at 09/29/22 0518   feeding supplement (OSMOLITE 1.2 CAL) 70 mL/hr at 09/28/22 0541     LOS: 23 days    Time spent: 35 minutes    Janera Peugh Darleen Crocker, DO [facility]  If 7PM-7AM, please contact night-coverage [email or web address] 09/29/2022, 9:48 AM

## 2022-09-30 DIAGNOSIS — G9341 Metabolic encephalopathy: Secondary | ICD-10-CM | POA: Diagnosis not present

## 2022-09-30 MED ORDER — QUETIAPINE FUMARATE 25 MG PO TABS
25.0000 mg | ORAL_TABLET | Freq: Every day | ORAL | Status: DC
  Administered 2022-09-30: 25 mg via ORAL
  Filled 2022-09-30: qty 1

## 2022-09-30 NOTE — Progress Notes (Signed)
PROGRESS NOTE    [name]  UGQ:[id number] DOB: October 24, 1960 DOA: 09/05/2022 PCP: [facility]   Brief Narrative:    [name] is a 62 yo male with PMH dementia, bipolar 1 disorder, HLD, HTN, CAD, CVA who presented with confusion and dysphagia. With further workup he was also found to be agitated/impulsive, and unable to be redirected.  He required Haldol and Ativan.  His significant other was concerned about his inability to swallow and he was brought for further workup. Because of the difficulty eating/dysphagia, he was initially initiated on a stroke workup.  However, further workup also was notable for an elevated lithium level.  Given his presenting symptoms, it was felt that they were attributed to lithium toxicity.  Poison control was called and patient was started on fluids and admitted. Despite aggressive measures, he showed little improvement during hospitalization.  Given his poor physical reserve and declining functional status leading up to hospitalization, he appeared to be more consistent with failure to thrive.  Goals of care discussions were held with his significant other who is his main caretaker and decision maker.  He was made DNR and continued on medical treatment in the hopes for some improvement however his prognosis was made clear that it appears to be worsening daily.  Palliative continues to have ongoing discussions with the girlfriend and ethics is involved.    09/18/22 -Had face-to-face Conference with patient's girlfriend on 09/13/2022 -Patient girlfriend requested that I do Not call her to talk to her about patient's poor prognosis anymore, because it makes her sad.- -Patient's girlfriend is accusing the medical team of "prematurely giving up on patient" --She tells me that she is not ready to make any decisions about possible de-escalation of care until the middle of January at the earliest -oral intake remains very  poor -Hypoglycemia with blood sugar of 69 noted despite continuous IV dextrose drip-- -will increase IV dextrose rate -Overall prognosis remains poor given very poor oral intake due to persistent metabolic encephalopathy superimposed on advanced dementia   09/19/22 -Remain calm comfortable, remained calm, comfortable, sleepy -Refused to eat or to be fed   09/20/2022 -Agitated confused overnight, Ativan and Haldol was used overnight -Appreciate palliative care following, and consulted ethics committee Current recommendation is neuroconsultation, attempt of Dobbhoff placement and initiation of tube feeding Fails we will proceed with comfort care     09/21/2022 -Some, minimal interaction between him and the nursing staff noted this morning with some oral intake -Plan remains for Dobbhoff placement per ethics committee recommendations Nutrition to be consulted for tube feeds -Psych has been consulted for evaluation -Pending neuro eval   09/22/2022 -Patient received Dobbhoff feeding tube placement by IR on 09/21/2022 -Nutrition has started tube feeding -Neurology has evaluated the patient-looking for reversible causes -Agitated overnight received Ativan around 3 AM Unresponsive non-interactive this a.m.   09/23/2022 -Patient was seen and examined -Agitated earlier per nursing staff -Dobbhoff tube still in place, tube feeds,     09/24/2022 -A bit more responsive to the nursing staff at bedside was encouraging oral intake -Drop-offs still present -tolerating tube feeds     09/25/2022 -Opens his eyes but does not follow command, nonverbal, noncommunicating -Per nursing staff agitated earlier this morning, has had p.o. intake for first time today -Dobbhoff feeding tube still in place   09/26/22 -Patient is nonverbal and appears to have pulled out feeding tube on 1/9 -Appears to tolerating some oral intake -Trial of lactulose today due  to elevated ammonia levels -Depakote recently increased  by behavioral health -Trial of Megace to see if this helps to improve appetite -Continue folate repletion -Appears to be having more of a failure to thrive picture and is appropriate for hospice  09/27/22 -Calorie count initiated to help see if pt meeting caloric needs -As per Neurology note, patient is an appropriate candidate for hospice. Since next of kin cannot be contacted at this point, attempts will be made to transfer to facility for hospice care.   09/28/22-09/29/22 -DSS evaluating for emergency guardianship at which point patient can be discharged once a facility has been obtained.  09/30/22 Patient has obtained emergency guardianship and is now awaiting placement.  Still requires sitter at bedside, plan to start Seroquel to assist with behavioral disturbances.  Assessment & Plan:   Principal Problem:   Acute metabolic encephalopathy Active Problems:   Bipolar 1 disorder (HCC)   Dysphagia   Lithium toxicity   Goals of care, counseling/discussion   Severe/Advanced Dementia with behavioral disturbance (HCC)   FTT (failure to thrive) in adult   Hypernatremia   Hyperlipidemia   Prolonged QT interval   Protein-calorie malnutrition, severe  Assessment and Plan:   1)Lithium Toxicity Resolved   - Lithium level 1.51 on admission -- lithium level normalized and dropped -No change in mentation,-not interactive, unresponsive   -Status post psych/TTS evaluation - appreciate tele-psych rec's; he has been taken off lithium and started on depakote; continue p.o. for now and if necessary,  transitioned to IV   - [name]: [name], unresponsive, not interactive, poor p.o. intake - poor QOL with decline as evidenced by severe cachexia;  -  DNR -palliative following     2) Advanced dementia with significant cognitive and memory deficits-encephalopathic -progressive decline-in setting of dementia   -Neurology consulted: Reviewing and evaluating for reversible causes-unlikely change  in mentation at this point -Psych/CTS evaluation, adjusting medication - unlikely to reverse his condition at this point   -patient significant other [name]  recalls that the patient has had significant memory and cognitive decline over the last several years -Overall 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 -The last time he was able to put a couple words together in a phrase was apparently around June 2023 when he said that the garage where he used to do his Dealer work was on [address] -otherwise over the last 12 months patient may answer with one-word answers, significant cognitive decline -Patient lost over 40 pounds of weight due to poor oral intake -Recount initiated on 1/11     3)Goals of care, counseling/discussion - Long conversation held with Landrum on 09/10/2022 and again on 09/13/2022, September 19, 2022   -Ongoing discussion daily with palliative care team -Ethic committee consulted, ongoing discussion For committee and Ms. Verner Chol -recommended trial of Dobbhoff feeding tube placement and tube feeding -      -No improvement-minimally interactive, impulsive,-continue to pull out IVs telemetry lines -As needed Ativan, Haldol -No PO nutritional intake -IV fluid limited as he is pulling the line out   -Palliative care team, and ethics committee consulted: Recommended: Attempt Dobbhoff tube and tube feeds if fails progress to comfort care Recommended Neuro consultation   - patient has been hospitalized for days at that point and he has  shown no signs of improvement.  He remains minimally interactive and impulsive, constantly trying to climb out of bed.  He has pulled IV out as well as telemetry off repetitively.   -He also has had extremely minimal nutritional intake since admission.     -WE do not find an to be a good candidate for G- tube placement as he will inevitably pull this out as well and PEG tube placement also considered medically futile and inappropriate in context of his multiple comorbidities, poor quality of life, and progressive functional decline   - Discussed with Sallye Ober that he does not appear to have the physical reserve to overcome the events that have led up to this hospitalization.  He may also not survive this hospitalization.       - [name] agrees with DNR status at this time. She does wish for ongoing attempts/treatment to see if he improves, but should he continue to decline (which I expect), then a natural death will be allowed. He is currently not a rehab candidate unless were to show tremendous improvement and rather he appears more appropriate for inpatient vs residential hospice depending on course of events over the next few days -Overall prognosis remains poor/grave -Family contact--   Deneise Lever [phone] -As per [name] .... No living family members.  -Palliative working on contacting Ms. Scales       4)Hypernatremia -resolved   5)Acute metabolic encephalopathy in setting of baseline advanced/severe dementia with significant cognitive and memory deficits at baseline - Etiology attributed to probable lithium toxicity - CT head unremarkable for acute findings.  Chronic left frontal lobe infarct appreciated -MRI brain also negative for acute stroke -Patient is reported to have some altered mentation at baseline as well; per girlfriend,  he is mostly nonverbal at baseline----please see #2 above -Continue sitter -Continue assist of nursing staff -Trial of lactulose for elevated ammonia level -Continue folate repletion -Depakote as adjusted per behavioral health   6)Dysphagia -Multifactorial including lithium toxicity, psychiatric disorder refusing oral intake -First oral intake 09/25/2022   -Per ethic committee trial of  Dobbhoff tube place and enteric feeding if it fails we will proceed with comfort care   - Recent EGD June 2023, cannot see results, but per GI had gastritis and Candida esophagitis - unfortunately no signs of improvement still - see GOC - evaluated by SLP - continue offering diet as able; poor candidate for NGT and PEG candidate; not appropriate for TPN (No clear goals for long-term solution) -Pulled out Dobbhoff tube     Leukocytosis -likely reactive-resolved -Chest x-ray on 09/17/2022 without acute findings -Get blood cultures, check UA -Hold off on antibiotics   Prolonged QT interval - Likely related to elevated lithium level - patient pulling off tele too much for meaning capture; okay to d/c - serial EKGs as he allows too - Hold QT prolonging agents when possible   Hyperlipidemia -discontinued statin, risk outweighs the benefit for this patient   Bipolar 1 disorder (HCC) - Discontinued lithium, per psych recommendation on Cogentin, IV Depakote - haldol / ativan PRN   Contamination of blood culture-resolved as of 09/09/2022 - 1/4 bottles noted with GPC from 12/20 but apparently nothing seen on media per micro. Has remained afebrile with minimal leukocytosis (considered reactive from other processes) - Will Repeat blood culture if becomes febrile      DVT prophylaxis:Lovenox Code Status: Full Family Communication: Tried calling significant other with no response 1/12 Disposition Plan:  Status is: Inpatient Remains inpatient  appropriate because: Need for ongoing close monitoring and IV medications.   Consultants:  Palliative Neurology Behavioral health Ethics  Procedures:  None  Antimicrobials:  Anti-infectives (From admission, onward)    Start     Dose/Rate Route Frequency Ordered Stop   09/06/22 0900  voriconazole (VFEND) tablet 200 mg  Status:  Discontinued        200 mg Oral  Once 09/05/22 1941 09/07/22 0815       Subjective: Patient seen and  evaluated today and is nonverbal.  He tolerates minimal oral intake.  Girlfriend could not be contacted.  Objective: Vitals:   09/28/22 2027 09/29/22 0500 09/29/22 1548 09/30/22 0559  BP: 113/66  99/61 106/66  Pulse: 77  (!) 55 69  Resp: 16  16 18   Temp: 97.9 F (36.6 C)  97.8 F (36.6 C) (!) 97 F (36.1 C)  TempSrc: Axillary  Axillary   SpO2: 100%  100%   Weight:  47.1 kg  47 kg  Height:        Intake/Output Summary (Last 24 hours) at 09/30/2022 1026 Last data filed at 09/30/2022 1000 Gross per 24 hour  Intake 1538 ml  Output --  Net 1538 ml   Filed Weights   09/28/22 0641 09/29/22 0500 09/30/22 0559  Weight: 47.8 kg 47.1 kg 47 kg    Examination:  General exam: Appears calm and comfortable, nonverbal Respiratory system: Clear to auscultation. Respiratory effort normal. Cardiovascular system: S1 & S2 heard, RRR.  Gastrointestinal system: Abdomen is soft Central nervous system: Alert and awake Extremities: No edema Skin: No significant lesions noted    Data Reviewed: I have personally reviewed following labs and imaging studies  CBC: Recent Labs  Lab 09/27/22 0422  WBC 4.6  HGB 14.0  HCT 47.1  MCV 80.2  PLT 179   Basic Metabolic Panel: Recent Labs  Lab 09/24/22 0734 09/25/22 0337 09/26/22 0337 09/27/22 0422  NA 134* 136 135 139  K 4.6 4.3 4.3 3.8  CL 100 100 98 102  CO2 25 27 24 27   GLUCOSE 84 81 86 108*  BUN 10 14 15 13   CREATININE 0.69 0.68 0.81 0.72  CALCIUM 9.1 9.2 9.3 9.1  MG  --   --  1.9 1.8   GFR: Estimated Creatinine Clearance: 63.6 mL/min (by C-G formula based on SCr of 0.72 mg/dL). Liver Function Tests: Recent Labs  Lab 09/24/22 0734 09/25/22 0337 09/26/22 0337 09/27/22 0422  AST 25 76* 73* 55*  ALT 20 63* 82* 68*  ALKPHOS 68 74 75 67  BILITOT 0.2* 0.3 <0.1* 0.4  PROT 6.6 6.9 7.3 7.0  ALBUMIN 2.9* 3.0* 3.1* 2.9*   No results for input(s): "LIPASE", "AMYLASE" in the last 168 hours. Recent Labs  Lab 09/24/22 0734  09/25/22 0337 09/27/22 0628  AMMONIA 37* 49* 41*   Coagulation Profile: No results for input(s): "INR", "PROTIME" in the last 168 hours. Cardiac Enzymes: No results for input(s): "CKTOTAL", "CKMB", "CKMBINDEX", "TROPONINI" in the last 168 hours. BNP (last 3 results) No results for input(s): "PROBNP" in the last 8760 hours. HbA1C: No results for input(s): "HGBA1C" in the last 72 hours. CBG: Recent Labs  Lab 09/24/22 0753 09/24/22 1420 09/25/22 1153 09/25/22 1817 09/26/22 0132  GLUCAP 91 114* 91 116* 106*   Lipid Profile: No results for input(s): "CHOL", "HDL", "LDLCALC", "TRIG", "CHOLHDL", "LDLDIRECT" in the last 72 hours. Thyroid Function Tests: No results for input(s): "TSH", "T4TOTAL", "FREET4", "T3FREE", "THYROIDAB" in the last 72 hours. Anemia Panel: No results  for input(s): "VITAMINB12", "FOLATE", "FERRITIN", "TIBC", "IRON", "RETICCTPCT" in the last 72 hours. Sepsis Labs: No results for input(s): "PROCALCITON", "LATICACIDVEN" in the last 168 hours.  No results found for this or any previous visit (from the past 240 hour(s)).       Radiology Studies: No results found.      Scheduled Meds:  amphetamine-dextroamphetamine  10 mg Oral Q breakfast   benztropine  0.5 mg Oral BID   Or   benztropine mesylate  0.5 mg Intramuscular BID   divalproex  250 mg Oral Q8H   enoxaparin (LOVENOX) injection  40 mg Subcutaneous Q24H   folic acid  1 mg Oral Daily   megestrol  200 mg Oral BID   nystatin   Topical BID   Continuous Infusions:  dextrose 5 % and 0.9% NaCl 75 mL/hr at 09/30/22 0444   feeding supplement (OSMOLITE 1.2 CAL) 70 mL/hr at 09/28/22 0541     LOS: 24 days    Time spent: 35 minutes    Taaj Hurlbut Hoover Brunette, DO [facility]  If 7PM-7AM, please contact night-coverage [email or web address] 09/30/2022, 10:26 AM

## 2022-10-01 DIAGNOSIS — Z7189 Other specified counseling: Secondary | ICD-10-CM | POA: Diagnosis not present

## 2022-10-01 DIAGNOSIS — E43 Unspecified severe protein-calorie malnutrition: Secondary | ICD-10-CM | POA: Diagnosis not present

## 2022-10-01 DIAGNOSIS — F03918 Unspecified dementia, unspecified severity, with other behavioral disturbance: Secondary | ICD-10-CM | POA: Diagnosis not present

## 2022-10-01 DIAGNOSIS — G9341 Metabolic encephalopathy: Secondary | ICD-10-CM | POA: Diagnosis not present

## 2022-10-01 LAB — VITAMIN B1: Vitamin B1 (Thiamine): 40.6 nmol/L — ABNORMAL LOW (ref 66.5–200.0)

## 2022-10-01 MED ORDER — QUETIAPINE FUMARATE 25 MG PO TABS
25.0000 mg | ORAL_TABLET | Freq: Two times a day (BID) | ORAL | Status: DC
  Administered 2022-10-01 (×2): 25 mg via ORAL
  Filled 2022-10-01 (×2): qty 1

## 2022-10-01 NOTE — Progress Notes (Signed)
PROGRESS NOTE    [name]  EVO:[id number] DOB: June 08, 1961 DOA: 09/05/2022 PCP: [facility]   Brief Narrative:    [name] is a 62 yo male with PMH dementia, bipolar 1 disorder, HLD, HTN, CAD, CVA who presented with confusion and dysphagia. With further workup he was also found to be agitated/impulsive, and unable to be redirected.  He required Haldol and Ativan.  His significant other was concerned about his inability to swallow and he was brought for further workup. Because of the difficulty eating/dysphagia, he was initially initiated on a stroke workup.  However, further workup also was notable for an elevated lithium level.  Given his presenting symptoms, it was felt that they were attributed to lithium toxicity.  Poison control was called and patient was started on fluids and admitted. Despite aggressive measures, he showed little improvement during hospitalization.  Given his poor physical reserve and declining functional status leading up to hospitalization, he appeared to be more consistent with failure to thrive.  Goals of care discussions were held with his significant other who is his main caretaker and decision maker.  He was made DNR and continued on medical treatment in the hopes for some improvement however his prognosis was made clear that it appears to be worsening daily.  Palliative continues to have ongoing discussions with the girlfriend and ethics is involved.    09/18/22 -Had face-to-face Conference with patient's girlfriend on 09/13/2022 -Patient girlfriend requested that I do Not call her to talk to her about patient's poor prognosis anymore, because it makes her sad.- -Patient's girlfriend is accusing the medical team of "prematurely giving up on patient" --She tells me that she is not ready to make any decisions about possible de-escalation of care until the middle of January at the earliest -oral intake remains very  poor -Hypoglycemia with blood sugar of 69 noted despite continuous IV dextrose drip-- -will increase IV dextrose rate -Overall prognosis remains poor given very poor oral intake due to persistent metabolic encephalopathy superimposed on advanced dementia   09/19/22 -Remain calm comfortable, remained calm, comfortable, sleepy -Refused to eat or to be fed   09/20/2022 -Agitated confused overnight, Ativan and Haldol was used overnight -Appreciate palliative care following, and consulted ethics committee Current recommendation is neuroconsultation, attempt of Dobbhoff placement and initiation of tube feeding Fails we will proceed with comfort care     09/21/2022 -Some, minimal interaction between him and the nursing staff noted this morning with some oral intake -Plan remains for Dobbhoff placement per ethics committee recommendations Nutrition to be consulted for tube feeds -Psych has been consulted for evaluation -Pending neuro eval   09/22/2022 -Patient received Dobbhoff feeding tube placement by IR on 09/21/2022 -Nutrition has started tube feeding -Neurology has evaluated the patient-looking for reversible causes -Agitated overnight received Ativan around 3 AM Unresponsive non-interactive this a.m.   09/23/2022 -Patient was seen and examined -Agitated earlier per nursing staff -Dobbhoff tube still in place, tube feeds,     09/24/2022 -A bit more responsive to the nursing staff at bedside was encouraging oral intake -Drop-offs still present -tolerating tube feeds     09/25/2022 -Opens his eyes but does not follow command, nonverbal, noncommunicating -Per nursing staff agitated earlier this morning, has had p.o. intake for first time today -Dobbhoff feeding tube still in place   09/26/22 -Patient is nonverbal and appears to have pulled out feeding tube on 1/9 -Appears to tolerating some oral intake -Trial of lactulose today due  to elevated ammonia levels -Depakote recently increased  by behavioral health -Trial of Megace to see if this helps to improve appetite -Continue folate repletion -Appears to be having more of a failure to thrive picture and is appropriate for hospice  09/27/22 -Calorie count initiated to help see if pt meeting caloric needs -As per Neurology note, patient is an appropriate candidate for hospice. Since next of kin cannot be contacted at this point, attempts will be made to transfer to facility for hospice care.   09/28/22-09/29/22 -DSS evaluating for emergency guardianship at which point patient can be discharged once a facility has been obtained.  09/30/22-10/01/22 Patient has obtained emergency guardianship and is now awaiting placement.  Still requires sitter at bedside, plan to start Seroquel to assist with behavioral disturbances.  Assessment & Plan:   Principal Problem:   Acute metabolic encephalopathy Active Problems:   Bipolar 1 disorder (HCC)   Dysphagia   Lithium toxicity   Goals of care, counseling/discussion   Severe/Advanced Dementia with behavioral disturbance (HCC)   FTT (failure to thrive) in adult   Hypernatremia   Hyperlipidemia   Prolonged QT interval   Protein-calorie malnutrition, severe  Assessment and Plan:   1)Lithium Toxicity Resolved   - Lithium level 1.51 on admission -- lithium level normalized and dropped -No change in mentation,-not interactive, unresponsive   -Status post psych/TTS evaluation - appreciate tele-psych rec's; he has been taken off lithium and started on depakote; continue p.o. for now and if necessary,  transitioned to IV   - [name]: Cachectic, unresponsive, not interactive, poor p.o. intake - poor QOL with decline as evidenced by severe cachexia;  -  DNR -palliative following     2) Advanced dementia with significant cognitive and memory deficits-encephalopathic -progressive decline-in setting of dementia   -Neurology consulted: Reviewing and evaluating for reversible causes-unlikely  change in mentation at this point -Psych/CTS evaluation, adjusting medication - unlikely to reverse his condition at this point   -patient significant other [name]  recalls that the patient has had significant memory and cognitive decline over the last several years -Overall 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 Verner Chol -recommended trial of Dobbhoff feeding tube placement and tube feeding -      -No improvement-minimally interactive, impulsive,-continue to pull out IVs telemetry lines -As needed Ativan, Haldol -No PO nutritional intake -IV fluid limited as he is pulling the line out   -Palliative care team, and ethics committee consulted: Recommended: Attempt Dobbhoff tube and tube feeds if fails progress to comfort care Recommended Neuro consultation   - patient has been hospitalized for days at that point and he has  shown no signs of improvement.  He remains minimally interactive and impulsive, constantly trying to climb out of bed.  He has pulled IV out as well as telemetry off repetitively.   -He also has had extremely minimal nutritional intake since  admission.    -WE do not find an to be a good candidate for G- tube placement as he will inevitably pull this out as well and PEG tube placement also considered medically futile and inappropriate in context of his multiple comorbidities, poor quality of life, and progressive functional decline   - Discussed with Sallye Ober that he does not appear to have the physical reserve to overcome the events that have led up to this hospitalization.  He may also not survive this hospitalization.       - [name] agrees with DNR status at this time. She does wish for ongoing attempts/treatment to see if he improves, but should he continue to decline (which I expect), then a natural death will be allowed. He is currently not a rehab candidate unless were to show tremendous improvement and rather he appears more appropriate for inpatient vs residential hospice depending on course of events over the next few days -Overall prognosis remains poor/grave -Family contact--   Deneise Lever [phone] -As per [name] .... No living family members.  -Palliative working on contacting Ms. Scales       4)Hypernatremia -resolved   5)Acute metabolic encephalopathy in setting of baseline advanced/severe dementia with significant cognitive and memory deficits at baseline - Etiology attributed to probable lithium toxicity - CT head unremarkable for acute findings.  Chronic left frontal lobe infarct appreciated -MRI brain also negative for acute stroke -Patient is reported to have some altered mentation at baseline as well; per girlfriend,  he is mostly nonverbal at baseline----please see #2 above -Continue sitter -Continue assist of nursing staff -Trial of lactulose for elevated ammonia level -Continue folate repletion -Depakote as adjusted per behavioral health   6)Dysphagia -Multifactorial including lithium toxicity, psychiatric disorder refusing oral intake -First oral intake 09/25/2022   -Per ethic  committee trial of Dobbhoff tube place and enteric feeding if it fails we will proceed with comfort care   - Recent EGD June 2023, cannot see results, but per GI had gastritis and Candida esophagitis - unfortunately no signs of improvement still - see GOC - evaluated by SLP - continue offering diet as able; poor candidate for NGT and PEG candidate; not appropriate for TPN (No clear goals for long-term solution) -Pulled out Dobbhoff tube     Leukocytosis -likely reactive-resolved -Chest x-ray on 09/17/2022 without acute findings -Get blood cultures, check UA -Hold off on antibiotics   Prolonged QT interval - Likely related to elevated lithium level - patient pulling off tele too much for meaning capture; okay to d/c - serial EKGs as he allows too - Hold QT prolonging agents when possible   Hyperlipidemia -discontinued statin, risk outweighs the benefit for this patient   Bipolar 1 disorder (HCC) - Discontinued lithium, per psych recommendation on Cogentin, IV Depakote - haldol / ativan PRN   Contamination of blood culture-resolved as of 09/09/2022 - 1/4 bottles noted with GPC from 12/20 but apparently nothing seen on media per micro. Has remained afebrile with minimal leukocytosis (considered reactive from other processes) - Will Repeat blood culture if becomes febrile      DVT prophylaxis:Lovenox Code Status: Full Family Communication: Tried calling significant other with no response 1/12 Disposition Plan:  Status is: Inpatient Remains inpatient  appropriate because: Need for ongoing close monitoring and IV medications.   Consultants:  Palliative Neurology Behavioral health Ethics  Procedures:  None  Antimicrobials:  Anti-infectives (From admission, onward)    Start     Dose/Rate Route Frequency Ordered Stop   09/06/22 0900  voriconazole (VFEND) tablet 200 mg  Status:  Discontinued        200 mg Oral  Once 09/05/22 1941 09/07/22 0815        Subjective: Patient seen and evaluated today and is nonverbal.  He tolerates minimal oral intake.  Girlfriend could not be contacted.  Objective: Vitals:   09/30/22 1500 09/30/22 2107 10/01/22 0500 10/01/22 0903  BP: (!) 88/51 113/78 113/89 (!) 133/105  Pulse: (!) 53 78 69 96  Resp: 15 18 17    Temp: 98.9 F (37.2 C) 98.1 F (36.7 C) 98.3 F (36.8 C) 98.3 F (36.8 C)  TempSrc: Axillary  Axillary Axillary  SpO2: 99% 90% 97%   Weight:   54.3 kg   Height:        Intake/Output Summary (Last 24 hours) at 10/01/2022 0959 Last data filed at 10/01/2022 0900 Gross per 24 hour  Intake 2656.65 ml  Output 1 ml  Net 2655.65 ml   Filed Weights   09/29/22 0500 09/30/22 0559 10/01/22 0500  Weight: 47.1 kg 47 kg 54.3 kg    Examination:  General exam: Appears calm and comfortable, nonverbal Respiratory system: Clear to auscultation. Respiratory effort normal. Cardiovascular system: S1 & S2 heard, RRR.  Gastrointestinal system: Abdomen is soft Central nervous system: Alert and awake Extremities: No edema Skin: No significant lesions noted    Data Reviewed: I have personally reviewed following labs and imaging studies  CBC: Recent Labs  Lab 09/27/22 0422  WBC 4.6  HGB 14.0  HCT 47.1  MCV 80.2  PLT 179   Basic Metabolic Panel: Recent Labs  Lab 09/25/22 0337 09/26/22 0337 09/27/22 0422  NA 136 135 139  K 4.3 4.3 3.8  CL 100 98 102  CO2 27 24 27   GLUCOSE 81 86 108*  BUN 14 15 13   CREATININE 0.68 0.81 0.72  CALCIUM 9.2 9.3 9.1  MG  --  1.9 1.8   GFR: Estimated Creatinine Clearance: 73.5 mL/min (by C-G formula based on SCr of 0.72 mg/dL). Liver Function Tests: Recent Labs  Lab 09/25/22 0337 09/26/22 0337 09/27/22 0422  AST 76* 73* 55*  ALT 63* 82* 68*  ALKPHOS 74 75 67  BILITOT 0.3 <0.1* 0.4  PROT 6.9 7.3 7.0  ALBUMIN 3.0* 3.1* 2.9*   No results for input(s): "LIPASE", "AMYLASE" in the last 168 hours. Recent Labs  Lab 09/25/22 0337 09/27/22 0628   AMMONIA 49* 41*   Coagulation Profile: No results for input(s): "INR", "PROTIME" in the last 168 hours. Cardiac Enzymes: No results for input(s): "CKTOTAL", "CKMB", "CKMBINDEX", "TROPONINI" in the last 168 hours. BNP (last 3 results) No results for input(s): "PROBNP" in the last 8760 hours. HbA1C: No results for input(s): "HGBA1C" in the last 72 hours. CBG: Recent Labs  Lab 09/24/22 1420 09/25/22 1153 09/25/22 1817 09/26/22 0132  GLUCAP 114* 91 116* 106*   Lipid Profile: No results for input(s): "CHOL", "HDL", "LDLCALC", "TRIG", "CHOLHDL", "LDLDIRECT" in the last 72 hours. Thyroid Function Tests: No results for input(s): "TSH", "T4TOTAL", "FREET4", "T3FREE", "THYROIDAB" in the last 72 hours. Anemia Panel: No results for input(s): "VITAMINB12", "FOLATE", "FERRITIN", "TIBC", "IRON", "RETICCTPCT" in the last 72 hours. Sepsis Labs: No results for input(s): "PROCALCITON", "LATICACIDVEN" in the  last 168 hours.  No results found for this or any previous visit (from the past 240 hour(s)).       Radiology Studies: No results found.      Scheduled Meds:  amphetamine-dextroamphetamine  10 mg Oral Q breakfast   benztropine  0.5 mg Oral BID   Or   benztropine mesylate  0.5 mg Intramuscular BID   divalproex  250 mg Oral Q8H   enoxaparin (LOVENOX) injection  40 mg Subcutaneous Z61W   folic acid  1 mg Oral Daily   megestrol  200 mg Oral BID   nystatin   Topical BID   QUEtiapine  25 mg Oral BID   Continuous Infusions:  dextrose 5 % and 0.9% NaCl 75 mL/hr at 10/01/22 0311     LOS: 25 days    Time spent: 35 minutes    Shantrice Rodenberg Darleen Crocker, DO [facility]  If 7PM-7AM, please contact night-coverage [email or web address] 10/01/2022, 9:59 AM

## 2022-10-01 NOTE — TOC Progression Note (Signed)
Transition of Care [facility]) - Progression Note    Patient Details  Name: [name] MRN: [id number] Date of Birth: 05/22/1961  [facility] CM/SW [place], [place] Phone Number: 10/01/2022, 11:34 AM  Clinical Narrative:    Pt back with sitter at this time. DSS office is closed today for holiday. TOC to follow up with DSS SW Chance for updates on if Medicaid application was started. TOC to follow.   Expected Discharge Plan: [place] Barriers to Discharge: Continued Medical Work up  Expected Discharge Plan and Services In-house Referral: Clinical Social Work   Post Acute Care Choice: [place] Living arrangements for the past 2 months: [place] Determinants of Health (SDOH) Interventions SDOH Screenings   Tobacco Use: Medium Risk (09/06/2022)    Readmission Risk Interventions    09/07/2022   12:35 PM  Readmission Risk Prevention Plan  Medication Screening Complete  Transportation Screening Complete

## 2022-10-01 NOTE — Progress Notes (Addendum)
Nutrition Follow up  DOCUMENTATION CODES:   Severe malnutrition in context of chronic illness  underweight  INTERVENTION:   Dysphagia 2 diet with nectar thick liquids  Add Magic cup with meals  Calorie count initiated x 48 hrs - completed. Results outlined below.  Assist with feeding all meals   NUTRITION DIAGNOSIS:   Severe Malnutrition related to chronic illness, acute illness (acute metabolic encephalopathy in setting of dementia, dysphagia) as evidenced by energy intake < or equal to 50% for > or equal to 5 days, severe fat depletion, severe muscle depletion, energy intake < 75% for > or equal to 1 month.  - ongoing   GOAL:  Patient will meet greater than or equal to 90% of their needs (if feasible given his confusion) - not met  MONITOR:  PO intake, Labs, TF tolerance, Weight trends  ASSESSMENT: Patient is a 62 yo male with hx of dementia, HTN, CVA who presented  with confusion and dysphagia. Acute metabolic encephalopathy.   1/12 Calorie count- results from 4 meals. Breakfast -100% + 4 oz choco ice cream Lunch-100% + 4 oz sherbet Dinner-100% (nectar-tea and magic cup) Breakfast-75% + 50% magic cup   Total estimated calories/ protein -2240 kcal/ 108 gr Average calorie/protein intake per meal- 560 kcal, 27 gr protein  He is meeting ~ 88% energy and 81% protein needs based on current intake.  1/15 Calorie count completed. Intake from 5 additional meals reported. Breakfast-x 2  (450 kcal, 25 gr protein) Lunch x 2  (480 kcal, 28 gr protein) Dinner x 1 (600 kcal, 20 gr protein)   Patient did not eat as well over the weekend- average intake for 5 meals: 306 kcal, 15 gr protein per meal. Meets- 48% energy, 45% protein needs. Fluid intake limited as well - average 120 ml each meal.   Patient at high risk for dehydration and additional weight loss based on current available data. Needs assistance with feeding and encouragement between meals to increase hydration.   If  patient can graduate to thin liquids Ensure Plus could be added. Possibly ST could assess for thin liquid upgrade if appropriate. He is not eating the magic cups as well as he did initially.   Weights reviewed. Last weight 12/20-59.4 kg. Will request new weight for assessment. Current weight obtained 49.5 kg which reflects severe loss of 10 kg (17%) < 1 month.   Medications: reviewed.      Latest Ref Rng & Units 09/27/2022    4:22 AM 09/26/2022    3:37 AM 09/25/2022    3:37 AM  BMP  Glucose 70 - 99 mg/dL 108  86  81   BUN 8 - 23 mg/dL 13  15  14    Creatinine 0.61 - 1.24 mg/dL 0.72  0.81  0.68   Sodium 135 - 145 mmol/L 139  135  136   Potassium 3.5 - 5.1 mmol/L 3.8  4.3  4.3   Chloride 98 - 111 mmol/L 102  98  100   CO2 22 - 32 mmol/L 27  24  27    Calcium 8.9 - 10.3 mg/dL 9.1  9.3  9.2         Diet Order:   Diet Order             DIET DYS 2 Room service appropriate? Yes; Fluid consistency: Nectar Thick  Diet effective now                   EDUCATION NEEDS:  Not  appropriate for education at this time  Skin:  Skin Assessment: Reviewed RN Assessment  Last BM:  1/14 large  Height:   Ht Readings from Last 1 Encounters:  09/05/22 5\' 8"  (1.727 m)    Weight:   Wt Readings from Last 1 Encounters:  10/01/22 54.3 kg    Ideal Body Weight:   70 kg  BMI:  Body mass index is 18.2 kg/m.  Estimated Nutritional Needs:   Kcal:  1900-2100  Protein:  100-105 gr  Fluid:  1800 ml daily  Colman Cater MS,RD,CSG,LDN Contact: Shea Evans

## 2022-10-01 NOTE — Progress Notes (Signed)
Pt became increasingly restless as night progressed.  Attempted to intervene with decreased stimulation, food, fluids, music.  Incontinent cares provided.  Pt hitting at siderails, eventually throwing legs over rails several times without being able to be redirected.  Ativan given with good relief.  Pt continues to be "fidgety" but is currently not trying to crawl over bedrails.  Sitter remains at bedside.  Tylenol given as pt was noted to be rubbing on knee.  Pt frequently rubs at objects with hands, however wanted to ensure pt comfort.  Pt tolerating food/fluids without difficulty swallowing.  Incontinent brief in place.

## 2022-10-01 NOTE — Progress Notes (Signed)
Palliative: Mr. Ki, [place], is lying quietly in bed.  He appears acutely/chronically ill and very frail.  He will briefly open when his name is called.  He does not attempt to answer orientation questions.  He cannot make his basic needs known.  There is no family at bedside this time.  Bedside nursing staff is present giving meds and attending to needs.    Mr. Lukes readily and easily opens his mouth to be fed.  There are no overt signs and symptoms of aspiration.  Mr. Hardage has been placed under temporary/emergency guardianship with [place].   Call to significant other, [name] Scales, to advise her of temporary/emergency guardianship.  3  Conference with attending, bedside nursing staff, transition care team related to patient condition, needs, care, disposition.  Plan: Continue to treat the treatable but no CPR or intubation.  As of 1/12 DSS social worker, Chance Cecille Rubin, is agreeable to long-term care placement with Medicaid and hopefully hospice care. Temporary/emergency guardianship with [place]. DSS CSW, Chance Cecille Rubin -[phone], is now guardian for Mr. Garcialopez.   Demographics updated.  68 minutes   Quinn Axe, NP Palliative medicine team Team phone [phone] Greater than 50% of this time was spent counseling and coordinating care related to the above assessment and plan.

## 2022-10-01 NOTE — Progress Notes (Addendum)
   10/01/22 0903  Vitals  Temp 98.3 F (36.8 C)  Temp Source Axillary  BP (!) 133/105  MAP (mmHg) 113  BP Location Right Leg  BP Method Automatic  Patient Position (if appropriate) Lying  Pulse Rate 96  Level of Consciousness  Level of Consciousness Alert  MEWS COLOR  MEWS Score Color Green  MEWS Score  MEWS Temp 0  MEWS Systolic 0  MEWS Pulse 0  MEWS RR 0  MEWS LOC 0  MEWS Score 0  Provider Notification  Provider Name/Title Dr Manuella Ghazi  Date Provider Notified 10/01/22  Time Provider Notified [phone]  Method of Notification Face-to-face (and page)  Notification Reason Other (Comment) (patient  was found in floor,had crawled over the bed. B/p 133/105..)  Provider response At bedside  Date of Provider Response 10/01/22  Time of Provider Response [phone]   Patient was found on floor this am, had crawled over the bed. Vital signs taken,Dr Manuella Ghazi notified. No open wounds or cuts,abrasions,no injury noted.Charge Nurse Velta Addison RN notified, and asked to call huddle for fall. Orders received .Plan of care on going.

## 2022-10-01 NOTE — Progress Notes (Addendum)
I called DSS,Guardian Chance Cecille Rubin, to notify DSS of Mr [name]'s fall. The Department of social services was closed due to [place] message was left on voicemail.

## 2022-10-02 DIAGNOSIS — G9341 Metabolic encephalopathy: Secondary | ICD-10-CM | POA: Diagnosis not present

## 2022-10-02 MED ORDER — QUETIAPINE FUMARATE 25 MG PO TABS
50.0000 mg | ORAL_TABLET | Freq: Two times a day (BID) | ORAL | Status: DC
  Administered 2022-10-02 (×2): 50 mg via ORAL
  Filled 2022-10-02 (×3): qty 2

## 2022-10-02 NOTE — Progress Notes (Signed)
PROGRESS NOTE    [name]  EVO:[id number] DOB: June 08, 1961 DOA: 09/05/2022 PCP: [facility]   Brief Narrative:    [name] is a 62 yo male with PMH dementia, bipolar 1 disorder, HLD, HTN, CAD, CVA who presented with confusion and dysphagia. With further workup he was also found to be agitated/impulsive, and unable to be redirected.  He required Haldol and Ativan.  His significant other was concerned about his inability to swallow and he was brought for further workup. Because of the difficulty eating/dysphagia, he was initially initiated on a stroke workup.  However, further workup also was notable for an elevated lithium level.  Given his presenting symptoms, it was felt that they were attributed to lithium toxicity.  Poison control was called and patient was started on fluids and admitted. Despite aggressive measures, he showed little improvement during hospitalization.  Given his poor physical reserve and declining functional status leading up to hospitalization, he appeared to be more consistent with failure to thrive.  Goals of care discussions were held with his significant other who is his main caretaker and decision maker.  He was made DNR and continued on medical treatment in the hopes for some improvement however his prognosis was made clear that it appears to be worsening daily.  Palliative continues to have ongoing discussions with the girlfriend and ethics is involved.    09/18/22 -Had face-to-face Conference with patient's girlfriend on 09/13/2022 -Patient girlfriend requested that I do Not call her to talk to her about patient's poor prognosis anymore, because it makes her sad.- -Patient's girlfriend is accusing the medical team of "prematurely giving up on patient" --She tells me that she is not ready to make any decisions about possible de-escalation of care until the middle of January at the earliest -oral intake remains very  poor -Hypoglycemia with blood sugar of 69 noted despite continuous IV dextrose drip-- -will increase IV dextrose rate -Overall prognosis remains poor given very poor oral intake due to persistent metabolic encephalopathy superimposed on advanced dementia   09/19/22 -Remain calm comfortable, remained calm, comfortable, sleepy -Refused to eat or to be fed   09/20/2022 -Agitated confused overnight, Ativan and Haldol was used overnight -Appreciate palliative care following, and consulted ethics committee Current recommendation is neuroconsultation, attempt of Dobbhoff placement and initiation of tube feeding Fails we will proceed with comfort care     09/21/2022 -Some, minimal interaction between him and the nursing staff noted this morning with some oral intake -Plan remains for Dobbhoff placement per ethics committee recommendations Nutrition to be consulted for tube feeds -Psych has been consulted for evaluation -Pending neuro eval   09/22/2022 -Patient received Dobbhoff feeding tube placement by IR on 09/21/2022 -Nutrition has started tube feeding -Neurology has evaluated the patient-looking for reversible causes -Agitated overnight received Ativan around 3 AM Unresponsive non-interactive this a.m.   09/23/2022 -Patient was seen and examined -Agitated earlier per nursing staff -Dobbhoff tube still in place, tube feeds,     09/24/2022 -A bit more responsive to the nursing staff at bedside was encouraging oral intake -Drop-offs still present -tolerating tube feeds     09/25/2022 -Opens his eyes but does not follow command, nonverbal, noncommunicating -Per nursing staff agitated earlier this morning, has had p.o. intake for first time today -Dobbhoff feeding tube still in place   09/26/22 -Patient is nonverbal and appears to have pulled out feeding tube on 1/9 -Appears to tolerating some oral intake -Trial of lactulose today due  to elevated ammonia levels -Depakote recently increased  by behavioral health -Trial of Megace to see if this helps to improve appetite -Continue folate repletion -Appears to be having more of a failure to thrive picture and is appropriate for hospice  09/27/22 -Calorie count initiated to help see if pt meeting caloric needs -As per Neurology note, patient is an appropriate candidate for hospice. Since next of kin cannot be contacted at this point, attempts will be made to transfer to facility for hospice care.   09/28/22-09/29/22 -DSS evaluating for emergency guardianship at which point patient can be discharged once a facility has been obtained.  09/30/22-10/01/22 Patient has obtained emergency guardianship and is now awaiting placement.  Still requires sitter at bedside, plan to start Seroquel to assist with behavioral disturbances.  10/02/22 Plan to increase Seroquel dosing to 50 mg twice daily due to ongoing agitation and ongoing need for sitter.  Assessment & Plan:   Principal Problem:   Acute metabolic encephalopathy Active Problems:   Bipolar 1 disorder (HCC)   Dysphagia   Lithium toxicity   Goals of care, counseling/discussion   Severe/Advanced Dementia with behavioral disturbance (HCC)   FTT (failure to thrive) in adult   Hypernatremia   Hyperlipidemia   Prolonged QT interval   Protein-calorie malnutrition, severe  Assessment and Plan:   1)Lithium Toxicity Resolved   - Lithium level 1.51 on admission -- lithium level normalized and dropped -No change in mentation,-not interactive, unresponsive   -Status post psych/TTS evaluation - appreciate tele-psych rec's; he has been taken off lithium and started on depakote; continue p.o. for now and if necessary,  transitioned to IV   - [name]: Cachectic, unresponsive, not interactive, poor p.o. intake - poor QOL with decline as evidenced by severe cachexia;  -  DNR -palliative following     2) Advanced dementia with significant cognitive and memory  deficits-encephalopathic -progressive decline-in setting of dementia   -Neurology consulted: Reviewing and evaluating for reversible causes-unlikely change in mentation at this point -Psych/CTS evaluation, adjusting medication - unlikely to reverse his condition at this point   -patient significant other [name]  recalls that the patient has had significant memory and cognitive decline over the last several years -Overall 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 -The last time he was able to put a couple words together in a phrase was apparently around June 2023 when he said that the garage where he used to do his Dealer work was on [address] -otherwise over the last 12 months patient may answer with one-word answers, significant cognitive decline -Patient lost over 40 pounds of weight due to poor oral intake -Recount initiated on 1/11     3)Goals of care, counseling/discussion - Long conversation held with [place] on 09/10/2022 and again on 09/13/2022, September 19, 2022   -Ongoing discussion daily with palliative care team -Ethic committee consulted, ongoing discussion For committee and Ms. Verner Chol -recommended trial of Dobbhoff feeding tube placement and tube feeding -      -No improvement-minimally interactive, impulsive,-continue to pull out IVs telemetry lines -As needed Ativan, Haldol -No PO nutritional intake -IV fluid limited as he is pulling the line out   -Palliative care team, and ethics committee consulted: Recommended: Attempt Dobbhoff tube and tube feeds if fails progress  to comfort care Recommended Neuro consultation   - patient has been hospitalized for days at that point and he has shown no signs of improvement.  He remains minimally interactive and impulsive, constantly  trying to climb out of bed.  He has pulled IV out as well as telemetry off repetitively.   -He also has had extremely minimal nutritional intake since admission.    -WE do not find an to be a good candidate for G- tube placement as he will inevitably pull this out as well and PEG tube placement also considered medically futile and inappropriate in context of his multiple comorbidities, poor quality of life, and progressive functional decline   - Discussed with Sallye Ober that he does not appear to have the physical reserve to overcome the events that have led up to this hospitalization.  He may also not survive this hospitalization.       - [name] agrees with DNR status at this time. She does wish for ongoing attempts/treatment to see if he improves, but should he continue to decline (which I expect), then a natural death will be allowed. He is currently not a rehab candidate unless were to show tremendous improvement and rather he appears more appropriate for inpatient vs residential hospice depending on course of events over the next few days -Overall prognosis remains poor/grave -Family contact--   Deneise Lever [phone] -As per [name] .... No living family members.  -Palliative working on contacting Ms. Scales       4)Hypernatremia -resolved   5)Acute metabolic encephalopathy in setting of baseline advanced/severe dementia with significant cognitive and memory deficits at baseline - Etiology attributed to probable lithium toxicity - CT head unremarkable for acute findings.  Chronic left frontal lobe infarct appreciated -MRI brain also negative for acute stroke -Patient is reported to have some altered mentation at baseline as well; per girlfriend,  he is mostly nonverbal at baseline----please see #2 above -Continue sitter -Continue assist of nursing staff -Trial of lactulose for elevated ammonia level -Continue folate repletion -Depakote as adjusted per behavioral health    6)Dysphagia -Multifactorial including lithium toxicity, psychiatric disorder refusing oral intake -First oral intake 09/25/2022   -Per ethic committee trial of Dobbhoff tube place and enteric feeding if it fails we will proceed with comfort care   - Recent EGD June 2023, cannot see results, but per GI had gastritis and Candida esophagitis - unfortunately no signs of improvement still - see GOC - evaluated by SLP - continue offering diet as able; poor candidate for NGT and PEG candidate; not appropriate for TPN (No clear goals for long-term solution) -Pulled out Dobbhoff tube     Leukocytosis -likely reactive-resolved -Chest x-ray on 09/17/2022 without acute findings -Get blood cultures, check UA -Hold off on antibiotics   Prolonged QT interval - Likely related to elevated lithium level - patient pulling off tele too much for meaning capture; okay to d/c - serial EKGs as he allows too - Hold QT prolonging agents when possible   Hyperlipidemia -discontinued statin, risk outweighs the benefit for this patient   Bipolar 1 disorder (HCC) - Discontinued lithium, per psych recommendation on Cogentin, IV Depakote - haldol / ativan PRN   Contamination of blood culture-resolved as of 09/09/2022 - 1/4 bottles noted with GPC from 12/20 but apparently nothing seen on media per micro. Has remained afebrile with minimal leukocytosis (considered reactive from other processes) - Will Repeat blood culture if becomes febrile      DVT prophylaxis:Lovenox  Code Status: Full Family Communication: Tried calling significant other with no response 1/12 Disposition Plan:  Status is: Inpatient Remains inpatient appropriate because: Need for ongoing close monitoring and IV medications.   Consultants:  Palliative Neurology Behavioral health Ethics  Procedures:  None  Antimicrobials:  Anti-infectives (From admission, onward)    Start     Dose/Rate Route Frequency Ordered Stop   09/06/22  0900  voriconazole (VFEND) tablet 200 mg  Status:  Discontinued        200 mg Oral  Once 09/05/22 1941 09/07/22 0815       Subjective: Patient seen and evaluated today and is nonverbal.  He tolerates minimal oral intake.  Girlfriend could not be contacted.  Objective: Vitals:   10/01/22 2055 10/02/22 0447 10/02/22 0513 10/02/22 0846  BP: 104/85 117/84  (!) 135/97  Pulse: 74 (!) 58  89  Resp: 19 18  16   Temp: 98 F (36.7 C) 98 F (36.7 C)  98.4 F (36.9 C)  TempSrc:    Axillary  SpO2: 97% 98%  100%  Weight:   49.1 kg   Height:        Intake/Output Summary (Last 24 hours) at 10/02/2022 1000 Last data filed at 10/02/2022 0700 Gross per 24 hour  Intake 410 ml  Output --  Net 410 ml   Filed Weights   09/30/22 0559 10/01/22 0500 10/02/22 0513  Weight: 47 kg 54.3 kg 49.1 kg    Examination:  General exam: Appears calm and comfortable, nonverbal Respiratory system: Clear to auscultation. Respiratory effort normal. Cardiovascular system: S1 & S2 heard, RRR.  Gastrointestinal system: Abdomen is soft Central nervous system: Alert and awake Extremities: No edema Skin: No significant lesions noted    Data Reviewed: I have personally reviewed following labs and imaging studies  CBC: Recent Labs  Lab 09/27/22 0422  WBC 4.6  HGB 14.0  HCT 47.1  MCV 80.2  PLT 154   Basic Metabolic Panel: Recent Labs  Lab 09/26/22 0337 09/27/22 0422  NA 135 139  K 4.3 3.8  CL 98 102  CO2 24 27  GLUCOSE 86 108*  BUN 15 13  CREATININE 0.81 0.72  CALCIUM 9.3 9.1  MG 1.9 1.8   GFR: Estimated Creatinine Clearance: 66.5 mL/min (by C-G formula based on SCr of 0.72 mg/dL). Liver Function Tests: Recent Labs  Lab 09/26/22 0337 09/27/22 0422  AST 73* 55*  ALT 82* 68*  ALKPHOS 75 67  BILITOT <0.1* 0.4  PROT 7.3 7.0  ALBUMIN 3.1* 2.9*   No results for input(s): "LIPASE", "AMYLASE" in the last 168 hours. Recent Labs  Lab 09/27/22 0628  AMMONIA 41*   Coagulation  Profile: No results for input(s): "INR", "PROTIME" in the last 168 hours. Cardiac Enzymes: No results for input(s): "CKTOTAL", "CKMB", "CKMBINDEX", "TROPONINI" in the last 168 hours. BNP (last 3 results) No results for input(s): "PROBNP" in the last 8760 hours. HbA1C: No results for input(s): "HGBA1C" in the last 72 hours. CBG: Recent Labs  Lab 09/25/22 1153 09/25/22 1817 09/26/22 0132  GLUCAP 91 116* 106*   Lipid Profile: No results for input(s): "CHOL", "HDL", "LDLCALC", "TRIG", "CHOLHDL", "LDLDIRECT" in the last 72 hours. Thyroid Function Tests: No results for input(s): "TSH", "T4TOTAL", "FREET4", "T3FREE", "THYROIDAB" in the last 72 hours. Anemia Panel: No results for input(s): "VITAMINB12", "FOLATE", "FERRITIN", "TIBC", "IRON", "RETICCTPCT" in the last 72 hours. Sepsis Labs: No results for input(s): "PROCALCITON", "LATICACIDVEN" in the last 168 hours.  No results found for this or any  previous visit (from the past 240 hour(s)).       Radiology Studies: No results found.      Scheduled Meds:  amphetamine-dextroamphetamine  10 mg Oral Q breakfast   benztropine  0.5 mg Oral BID   Or   benztropine mesylate  0.5 mg Intramuscular BID   divalproex  250 mg Oral Q8H   enoxaparin (LOVENOX) injection  40 mg Subcutaneous Q24H   folic acid  1 mg Oral Daily   megestrol  200 mg Oral BID   nystatin   Topical BID   QUEtiapine  50 mg Oral BID   Continuous Infusions:  dextrose 5 % and 0.9% NaCl 75 mL/hr at 10/02/22 0604     LOS: 26 days    Time spent: 35 minutes    Natilie Krabbenhoft Hoover Brunette, DO [facility]  If 7PM-7AM, please contact night-coverage [email or web address] 10/02/2022, 10:00 AM

## 2022-10-02 NOTE — Progress Notes (Signed)
Pt disoriented x 4 and mute. Pt agitated and trying to climb out of bed at beginning of shift. Pt tried to bite and hit me when attempting to fix IV. PRN ativan given with little success. PRN haldol given an hour later and was effective. Pt started to become agitated again in a.m. PRN haldol given at 0500.

## 2022-10-02 NOTE — TOC Progression Note (Signed)
Transition of Care [facility]) - Progression Note    Patient Details  Name: [name] MRN: [id number] Date of Birth: 1961/02/09  [facility]) CM/SW [place], [place] Phone Number: 10/02/2022, 12:59 PM  Clinical Narrative:    CSW spoke to Chance with DSS who states that pts medicaid application has been completed at this time. CSW update treatment team of this. Pt is still with sitter at this time. TOC to continue following.   Expected Discharge Plan: [place] Barriers to Discharge: Continued Medical Work up  Expected Discharge Plan and Services In-house Referral: Clinical Social Work   Post Acute Care Choice: [place] Living arrangements for the past 2 months: [place] Determinants of Health (SDOH) Interventions SDOH Screenings   Tobacco Use: Medium Risk (09/06/2022)    Readmission Risk Interventions    09/07/2022   12:35 PM  Readmission Risk Prevention Plan  Medication Screening Complete  Transportation Screening Complete

## 2022-10-03 DIAGNOSIS — G9341 Metabolic encephalopathy: Secondary | ICD-10-CM | POA: Diagnosis not present

## 2022-10-03 LAB — CBC
HCT: 40.9 % (ref 39.0–52.0)
Hemoglobin: 12.5 g/dL — ABNORMAL LOW (ref 13.0–17.0)
MCH: 24 pg — ABNORMAL LOW (ref 26.0–34.0)
MCHC: 30.6 g/dL (ref 30.0–36.0)
MCV: 78.7 fL — ABNORMAL LOW (ref 80.0–100.0)
Platelets: 214 10*3/uL (ref 150–400)
RBC: 5.2 MIL/uL (ref 4.22–5.81)
RDW: 15.9 % — ABNORMAL HIGH (ref 11.5–15.5)
WBC: 3.1 10*3/uL — ABNORMAL LOW (ref 4.0–10.5)
nRBC: 0 % (ref 0.0–0.2)

## 2022-10-03 LAB — COMPREHENSIVE METABOLIC PANEL
ALT: 47 U/L — ABNORMAL HIGH (ref 0–44)
AST: 34 U/L (ref 15–41)
Albumin: 2.9 g/dL — ABNORMAL LOW (ref 3.5–5.0)
Alkaline Phosphatase: 62 U/L (ref 38–126)
Anion gap: 7 (ref 5–15)
BUN: 10 mg/dL (ref 8–23)
CO2: 24 mmol/L (ref 22–32)
Calcium: 9 mg/dL (ref 8.9–10.3)
Chloride: 108 mmol/L (ref 98–111)
Creatinine, Ser: 0.67 mg/dL (ref 0.61–1.24)
GFR, Estimated: >60 mL/min (ref >60)
Glucose, Bld: 92 mg/dL (ref 70–99)
Potassium: 4.6 mmol/L (ref 3.5–5.1)
Sodium: 139 mmol/L (ref 135–145)
Total Bilirubin: 0.3 mg/dL (ref 0.3–1.2)
Total Protein: 6.7 g/dL (ref 6.5–8.1)

## 2022-10-03 LAB — MAGNESIUM: Magnesium: 1.8 mg/dL (ref 1.7–2.4)

## 2022-10-03 MED ORDER — ORAL CARE MOUTH RINSE: 15.0000 mL | OROMUCOSAL | Status: DC | PRN

## 2022-10-03 MED ORDER — QUETIAPINE FUMARATE 25 MG PO TABS
100.0000 mg | ORAL_TABLET | Freq: Two times a day (BID) | ORAL | Status: DC
  Administered 2022-10-03 – 2022-10-04 (×3): 100 mg via ORAL
  Filled 2022-10-03 (×2): qty 4

## 2022-10-03 MED ORDER — ORAL CARE MOUTH RINSE
15.0000 mL | OROMUCOSAL | Status: DC
  Administered 2022-10-03 – 2022-10-27 (×61): 15 mL via OROMUCOSAL

## 2022-10-03 NOTE — Progress Notes (Signed)
Despite redirection, pt attempting to get out of bed, is pulling at IV, and swung at this RN. Haldol given. Bryson Corona Edd Fabian

## 2022-10-03 NOTE — Progress Notes (Addendum)
Pt has been restless and attempting to get out of bed all night. He has attempted to hit staff. Haldol given x 2 and ativan given x 1 on this shift. Not much relief was achieved. Pt was offered applesauce and ensure in which he was able to eat/drink all. He had 3 applesauce containers and one ensure chocolate that were fed to him by this RN. Sitter remains at bedside. Bryson Corona Edd Fabian

## 2022-10-03 NOTE — Progress Notes (Signed)
Will not speak when asked name, etc.  Stays on side most of time and is resistive to care when being straightened.  After care, was restless and tried to get out of bed according to sitter.  Ate whole container of applesauce with med pass and no swallowing difficulty noted.

## 2022-10-03 NOTE — Progress Notes (Signed)
PROGRESS NOTE    [name]  EVO:[id number] DOB: June 08, 1961 DOA: 09/05/2022 PCP: [facility]   Brief Narrative:    [name] is a 62 yo male with PMH dementia, bipolar 1 disorder, HLD, HTN, CAD, CVA who presented with confusion and dysphagia. With further workup he was also found to be agitated/impulsive, and unable to be redirected.  He required Haldol and Ativan.  His significant other was concerned about his inability to swallow and he was brought for further workup. Because of the difficulty eating/dysphagia, he was initially initiated on a stroke workup.  However, further workup also was notable for an elevated lithium level.  Given his presenting symptoms, it was felt that they were attributed to lithium toxicity.  Poison control was called and patient was started on fluids and admitted. Despite aggressive measures, he showed little improvement during hospitalization.  Given his poor physical reserve and declining functional status leading up to hospitalization, he appeared to be more consistent with failure to thrive.  Goals of care discussions were held with his significant other who is his main caretaker and decision maker.  He was made DNR and continued on medical treatment in the hopes for some improvement however his prognosis was made clear that it appears to be worsening daily.  Palliative continues to have ongoing discussions with the girlfriend and ethics is involved.    09/18/22 -Had face-to-face Conference with patient's girlfriend on 09/13/2022 -Patient girlfriend requested that I do Not call her to talk to her about patient's poor prognosis anymore, because it makes her sad.- -Patient's girlfriend is accusing the medical team of "prematurely giving up on patient" --She tells me that she is not ready to make any decisions about possible de-escalation of care until the middle of January at the earliest -oral intake remains very  poor -Hypoglycemia with blood sugar of 69 noted despite continuous IV dextrose drip-- -will increase IV dextrose rate -Overall prognosis remains poor given very poor oral intake due to persistent metabolic encephalopathy superimposed on advanced dementia   09/19/22 -Remain calm comfortable, remained calm, comfortable, sleepy -Refused to eat or to be fed   09/20/2022 -Agitated confused overnight, Ativan and Haldol was used overnight -Appreciate palliative care following, and consulted ethics committee Current recommendation is neuroconsultation, attempt of Dobbhoff placement and initiation of tube feeding Fails we will proceed with comfort care     09/21/2022 -Some, minimal interaction between him and the nursing staff noted this morning with some oral intake -Plan remains for Dobbhoff placement per ethics committee recommendations Nutrition to be consulted for tube feeds -Psych has been consulted for evaluation -Pending neuro eval   09/22/2022 -Patient received Dobbhoff feeding tube placement by IR on 09/21/2022 -Nutrition has started tube feeding -Neurology has evaluated the patient-looking for reversible causes -Agitated overnight received Ativan around 3 AM Unresponsive non-interactive this a.m.   09/23/2022 -Patient was seen and examined -Agitated earlier per nursing staff -Dobbhoff tube still in place, tube feeds,     09/24/2022 -A bit more responsive to the nursing staff at bedside was encouraging oral intake -Drop-offs still present -tolerating tube feeds     09/25/2022 -Opens his eyes but does not follow command, nonverbal, noncommunicating -Per nursing staff agitated earlier this morning, has had p.o. intake for first time today -Dobbhoff feeding tube still in place   09/26/22 -Patient is nonverbal and appears to have pulled out feeding tube on 1/9 -Appears to tolerating some oral intake -Trial of lactulose today due  to elevated ammonia levels -Depakote recently increased  by behavioral health -Trial of Megace to see if this helps to improve appetite -Continue folate repletion -Appears to be having more of a failure to thrive picture and is appropriate for hospice  09/27/22 -Calorie count initiated to help see if pt meeting caloric needs -As per Neurology note, patient is an appropriate candidate for hospice. Since next of kin cannot be contacted at this point, attempts will be made to transfer to facility for hospice care.   09/28/22-09/29/22 -DSS evaluating for emergency guardianship at which point patient can be discharged once a facility has been obtained.  09/30/22-10/01/22 Patient has obtained emergency guardianship and is now awaiting placement.  Still requires sitter at bedside, plan to start Seroquel to assist with behavioral disturbances.  10/02/22 Plan to increase Seroquel dosing to 50 mg twice daily due to ongoing agitation and ongoing need for sitter.  10/03/22 Increase Seroquel to 100 mg twice daily with ongoing agitation noted overnight.  Assessment & Plan:   Principal Problem:   Acute metabolic encephalopathy Active Problems:   Bipolar 1 disorder (HCC)   Dysphagia   Lithium toxicity   Goals of care, counseling/discussion   Severe/Advanced Dementia with behavioral disturbance (HCC)   FTT (failure to thrive) in adult   Hypernatremia   Hyperlipidemia   Prolonged QT interval   Protein-calorie malnutrition, severe  Assessment and Plan:   1)Lithium Toxicity Resolved   - Lithium level 1.51 on admission -- lithium level normalized and dropped -No change in mentation,-not interactive, unresponsive   -Status post psych/TTS evaluation - appreciate tele-psych rec's; he has been taken off lithium and started on depakote; continue p.o. for now and if necessary,  transitioned to IV   - [name]: [name], unresponsive, not interactive, poor p.o. intake - poor QOL with decline as evidenced by severe cachexia;  -  DNR -palliative following      2) Advanced dementia with significant cognitive and memory deficits-encephalopathic -progressive decline-in setting of dementia   -Neurology consulted: Reviewing and evaluating for reversible causes-unlikely change in mentation at this point -Psych/CTS evaluation, adjusting medication - unlikely to reverse his condition at this point   -patient significant other [name]  recalls that the patient has had significant memory and cognitive decline over the last several years -Overall 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 -The last time he was able to put a couple words together in a phrase was apparently around June 2023 when he said that the garage where he used to do his Dealer work was on [address] -otherwise over the last 12 months patient may answer with one-word answers, significant cognitive decline -Patient lost over 40 pounds of weight due to poor oral intake -Recount initiated on 1/11     3)Goals of care, counseling/discussion - Long conversation held with [place] on 09/10/2022 and again on 09/13/2022, September 19, 2022   -Ongoing discussion daily with palliative care team -Ethic committee consulted, ongoing discussion For committee and Ms. Verner Chol -recommended trial of Dobbhoff feeding tube placement and tube feeding -      -No improvement-minimally interactive, impulsive,-continue to pull out IVs telemetry lines -As needed Ativan, Haldol -No PO nutritional intake -IV fluid limited as he is pulling the line out   -Palliative care team,  and ethics committee consulted: Recommended: Attempt Dobbhoff tube and tube feeds if fails progress to comfort care Recommended Neuro consultation   - patient has been hospitalized for days at that point and he has shown no signs of improvement.   He remains minimally interactive and impulsive, constantly trying to climb out of bed.  He has pulled IV out as well as telemetry off repetitively.   -He also has had extremely minimal nutritional intake since admission.    -WE do not find an to be a good candidate for G- tube placement as he will inevitably pull this out as well and PEG tube placement also considered medically futile and inappropriate in context of his multiple comorbidities, poor quality of life, and progressive functional decline   - Discussed with Sallye Ober that he does not appear to have the physical reserve to overcome the events that have led up to this hospitalization.  He may also not survive this hospitalization.       - [name] agrees with DNR status at this time. She does wish for ongoing attempts/treatment to see if he improves, but should he continue to decline (which I expect), then a natural death will be allowed. He is currently not a rehab candidate unless were to show tremendous improvement and rather he appears more appropriate for inpatient vs residential hospice depending on course of events over the next few days -Overall prognosis remains poor/grave -Family contact--   Deneise Lever [phone] -As per [name] .... No living family members.  -Palliative working on contacting Ms. Scales       4)Hypernatremia -resolved   5)Acute metabolic encephalopathy in setting of baseline advanced/severe dementia with significant cognitive and memory deficits at baseline - Etiology attributed to probable lithium toxicity - CT head unremarkable for acute findings.  Chronic left frontal lobe infarct appreciated -MRI brain also negative for acute stroke -Patient is reported to have some altered mentation at baseline as well; per girlfriend,  he is mostly nonverbal at baseline----please see #2 above -Continue sitter -Continue assist of nursing staff -Trial of lactulose for elevated ammonia level -Continue  folate repletion -Depakote as adjusted per behavioral health   6)Dysphagia -Multifactorial including lithium toxicity, psychiatric disorder refusing oral intake -First oral intake 09/25/2022   -Per ethic committee trial of Dobbhoff tube place and enteric feeding if it fails we will proceed with comfort care   - Recent EGD June 2023, cannot see results, but per GI had gastritis and Candida esophagitis - unfortunately no signs of improvement still - see GOC - evaluated by SLP - continue offering diet as able; poor candidate for NGT and PEG candidate; not appropriate for TPN (No clear goals for long-term solution) -Pulled out Dobbhoff tube     Leukocytosis -likely reactive-resolved -Chest x-ray on 09/17/2022 without acute findings -Get blood cultures, check UA -Hold off on antibiotics   Prolonged QT interval - Likely related to elevated lithium level - patient pulling off tele too much for meaning capture; okay to d/c - serial EKGs as he allows too - Hold QT prolonging agents when possible   Hyperlipidemia -discontinued statin, risk outweighs the benefit for this patient   Bipolar 1 disorder (HCC) - Discontinued lithium, per psych recommendation on Cogentin, IV Depakote - haldol / ativan PRN   Contamination of blood culture-resolved as of 09/09/2022 - 1/4 bottles noted with GPC from 12/20 but apparently nothing seen on media per micro. Has remained afebrile with minimal leukocytosis (considered reactive from other processes) -  Will Repeat blood culture if becomes febrile      DVT prophylaxis:Lovenox Code Status: Full Family Communication: Tried calling significant other with no response 1/12 Disposition Plan:  Status is: Inpatient Remains inpatient appropriate because: Need for ongoing close monitoring and IV medications.   Consultants:  Palliative Neurology Behavioral health Ethics  Procedures:  None  Antimicrobials:  Anti-infectives (From admission, onward)     Start     Dose/Rate Route Frequency Ordered Stop   09/06/22 0900  voriconazole (VFEND) tablet 200 mg  Status:  Discontinued        200 mg Oral  Once 09/05/22 1941 09/07/22 0815       Subjective: Patient seen and evaluated today and is nonverbal.  He tolerates minimal oral intake.  Girlfriend could not be contacted.  Objective: Vitals:   10/02/22 1556 10/02/22 1622 10/02/22 2043 10/03/22 0500  BP: 102/61  118/89   Pulse: 80  60   Resp: 18  19   Temp: 98.4 F (36.9 C)     TempSrc: Axillary     SpO2:  95%    Weight:    50.7 kg  Height:        Intake/Output Summary (Last 24 hours) at 10/03/2022 0940 Last data filed at 10/03/2022 0424 Gross per 24 hour  Intake 2576.63 ml  Output 900 ml  Net 1676.63 ml   Filed Weights   10/01/22 0500 10/02/22 0513 10/03/22 0500  Weight: 54.3 kg 49.1 kg 50.7 kg    Examination:  General exam: Appears calm and comfortable, nonverbal Respiratory system: Clear to auscultation. Respiratory effort normal. Cardiovascular system: S1 & S2 heard, RRR.  Gastrointestinal system: Abdomen is soft Central nervous system: Alert and awake Extremities: No edema Skin: No significant lesions noted    Data Reviewed: I have personally reviewed following labs and imaging studies  CBC: Recent Labs  Lab 09/27/22 0422 10/03/22 0410  WBC 4.6 3.1*  HGB 14.0 12.5*  HCT 47.1 40.9  MCV 80.2 78.7*  PLT 179 214   Basic Metabolic Panel: Recent Labs  Lab 09/27/22 0422 10/03/22 0410  NA 139 139  K 3.8 4.6  CL 102 108  CO2 27 24  GLUCOSE 108* 92  BUN 13 10  CREATININE 0.72 0.67  CALCIUM 9.1 9.0  MG 1.8 1.8   GFR: Estimated Creatinine Clearance: 68.7 mL/min (by C-G formula based on SCr of 0.67 mg/dL). Liver Function Tests: Recent Labs  Lab 09/27/22 0422 10/03/22 0410  AST 55* 34  ALT 68* 47*  ALKPHOS 67 62  BILITOT 0.4 0.3  PROT 7.0 6.7  ALBUMIN 2.9* 2.9*   No results for input(s): "LIPASE", "AMYLASE" in the last 168 hours. Recent Labs   Lab 09/27/22 0628  AMMONIA 41*   Coagulation Profile: No results for input(s): "INR", "PROTIME" in the last 168 hours. Cardiac Enzymes: No results for input(s): "CKTOTAL", "CKMB", "CKMBINDEX", "TROPONINI" in the last 168 hours. BNP (last 3 results) No results for input(s): "PROBNP" in the last 8760 hours. HbA1C: No results for input(s): "HGBA1C" in the last 72 hours. CBG: No results for input(s): "GLUCAP" in the last 168 hours.  Lipid Profile: No results for input(s): "CHOL", "HDL", "LDLCALC", "TRIG", "CHOLHDL", "LDLDIRECT" in the last 72 hours. Thyroid Function Tests: No results for input(s): "TSH", "T4TOTAL", "FREET4", "T3FREE", "THYROIDAB" in the last 72 hours. Anemia Panel: No results for input(s): "VITAMINB12", "FOLATE", "FERRITIN", "TIBC", "IRON", "RETICCTPCT" in the last 72 hours. Sepsis Labs: No results for input(s): "PROCALCITON", "LATICACIDVEN" in the last 168 hours.  No results found for this or any previous visit (from the past 240 hour(s)).       Radiology Studies: No results found.      Scheduled Meds:  amphetamine-dextroamphetamine  10 mg Oral Q breakfast   benztropine  0.5 mg Oral BID   Or   benztropine mesylate  0.5 mg Intramuscular BID   divalproex  250 mg Oral Q8H   enoxaparin (LOVENOX) injection  40 mg Subcutaneous G83M   folic acid  1 mg Oral Daily   megestrol  200 mg Oral BID   nystatin   Topical BID   mouth rinse  15 mL Mouth Rinse 4 times per day   QUEtiapine  100 mg Oral BID   Continuous Infusions:  dextrose 5 % and 0.9% NaCl 75 mL/hr at 10/03/22 0424     LOS: 27 days    Time spent: 35 minutes    Denzell Colasanti Darleen Crocker, DO [facility]  If 7PM-7AM, please contact night-coverage [email or web address] 10/03/2022, 9:40 AM

## 2022-10-04 DIAGNOSIS — Z7189 Other specified counseling: Secondary | ICD-10-CM | POA: Diagnosis not present

## 2022-10-04 DIAGNOSIS — E43 Unspecified severe protein-calorie malnutrition: Secondary | ICD-10-CM | POA: Diagnosis not present

## 2022-10-04 DIAGNOSIS — R627 Adult failure to thrive: Secondary | ICD-10-CM | POA: Diagnosis not present

## 2022-10-04 DIAGNOSIS — G9341 Metabolic encephalopathy: Secondary | ICD-10-CM | POA: Diagnosis not present

## 2022-10-04 LAB — GLUCOSE, CAPILLARY: Glucose-Capillary: 69 mg/dL — ABNORMAL LOW (ref 70–99)

## 2022-10-04 MED ORDER — QUETIAPINE FUMARATE 25 MG PO TABS
150.0000 mg | ORAL_TABLET | Freq: Two times a day (BID) | ORAL | Status: DC
  Administered 2022-10-04: 150 mg via ORAL
  Filled 2022-10-04 (×2): qty 6

## 2022-10-04 NOTE — Progress Notes (Signed)
Pt is disoriented x4 and has been asleep most of this shift. PT was able to take medication this morning but has refused the afternoon doses. NT was able to feed him 50% of breakfast but reports that he refused to eat lunch. Sitter has remained bedside.

## 2022-10-04 NOTE — Progress Notes (Signed)
Palliative: [name] is lying quietly in bed.  He appears acutely/chronically ill and very frail.  He does not respond in any meaningful way to voice or touch.  He clearly cannot make his basic needs known.  There is no family at bedside at this time.  Bedside nursing staff is present for safety.   Unfortunately, it seems that [name] is not eating enough to sustain himself.  He has been sustained on 75 mL/h of IV fluid for several days now.  [name] now has a guardian through [place].  His DSS social worker is [name].  [name] tells me that [name] has INTERIM guardianship with the court date scheduled for February 7.  All DSS has interim guardianship they are unable to make life and death choices such as hospice care.   We talk about [name] poor by mouth intake both for food and drink.  We talked about IV fluids sustaining him.  [name] shares that all of [name] family died in their late 14s and early 37s.  He has no family left.  She tells me that his significant other of 27 years, [name], may be open to hospice type care.  [name] shares that [name] is often unable to answer phone calls and return messages in a timely manner because she works in a warehouse.  Conference with attending, bedside nursing staff, transition of care team related to patient condition, needs, goals of care, disposition.  Plan: At this point continue to treat the treatable but no CPR or intubation.  Time for outcomes.  Interim guardianship with [place] with the court date of 2/7.   24 minutes  Tyler Axe, NP Palliative medicine team Team phone [phone] Greater than 50% of this time was spent counseling and coordinating care related to the above assessment and plan.

## 2022-10-04 NOTE — Progress Notes (Signed)
PROGRESS NOTE    [name]  EVO:[id number] DOB: June 08, 1961 DOA: 09/05/2022 PCP: [facility]   Brief Narrative:    [name] is a 62 yo male with PMH dementia, bipolar 1 disorder, HLD, HTN, CAD, CVA who presented with confusion and dysphagia. With further workup he was also found to be agitated/impulsive, and unable to be redirected.  He required Haldol and Ativan.  His significant other was concerned about his inability to swallow and he was brought for further workup. Because of the difficulty eating/dysphagia, he was initially initiated on a stroke workup.  However, further workup also was notable for an elevated lithium level.  Given his presenting symptoms, it was felt that they were attributed to lithium toxicity.  Poison control was called and patient was started on fluids and admitted. Despite aggressive measures, he showed little improvement during hospitalization.  Given his poor physical reserve and declining functional status leading up to hospitalization, he appeared to be more consistent with failure to thrive.  Goals of care discussions were held with his significant other who is his main caretaker and decision maker.  He was made DNR and continued on medical treatment in the hopes for some improvement however his prognosis was made clear that it appears to be worsening daily.  Palliative continues to have ongoing discussions with the girlfriend and ethics is involved.    09/18/22 -Had face-to-face Conference with patient's girlfriend on 09/13/2022 -Patient girlfriend requested that I do Not call her to talk to her about patient's poor prognosis anymore, because it makes her sad.- -Patient's girlfriend is accusing the medical team of "prematurely giving up on patient" --She tells me that she is not ready to make any decisions about possible de-escalation of care until the middle of January at the earliest -oral intake remains very  poor -Hypoglycemia with blood sugar of 69 noted despite continuous IV dextrose drip-- -will increase IV dextrose rate -Overall prognosis remains poor given very poor oral intake due to persistent metabolic encephalopathy superimposed on advanced dementia   09/19/22 -Remain calm comfortable, remained calm, comfortable, sleepy -Refused to eat or to be fed   09/20/2022 -Agitated confused overnight, Ativan and Haldol was used overnight -Appreciate palliative care following, and consulted ethics committee Current recommendation is neuroconsultation, attempt of Dobbhoff placement and initiation of tube feeding Fails we will proceed with comfort care     09/21/2022 -Some, minimal interaction between him and the nursing staff noted this morning with some oral intake -Plan remains for Dobbhoff placement per ethics committee recommendations Nutrition to be consulted for tube feeds -Psych has been consulted for evaluation -Pending neuro eval   09/22/2022 -Patient received Dobbhoff feeding tube placement by IR on 09/21/2022 -Nutrition has started tube feeding -Neurology has evaluated the patient-looking for reversible causes -Agitated overnight received Ativan around 3 AM Unresponsive non-interactive this a.m.   09/23/2022 -Patient was seen and examined -Agitated earlier per nursing staff -Dobbhoff tube still in place, tube feeds,     09/24/2022 -A bit more responsive to the nursing staff at bedside was encouraging oral intake -Drop-offs still present -tolerating tube feeds     09/25/2022 -Opens his eyes but does not follow command, nonverbal, noncommunicating -Per nursing staff agitated earlier this morning, has had p.o. intake for first time today -Dobbhoff feeding tube still in place   09/26/22 -Patient is nonverbal and appears to have pulled out feeding tube on 1/9 -Appears to tolerating some oral intake -Trial of lactulose today due  to elevated ammonia levels -Depakote recently increased  by behavioral health -Trial of Megace to see if this helps to improve appetite -Continue folate repletion -Appears to be having more of a failure to thrive picture and is appropriate for hospice  09/27/22 -Calorie count initiated to help see if pt meeting caloric needs -As per Neurology note, patient is an appropriate candidate for hospice. Since next of kin cannot be contacted at this point, attempts will be made to transfer to facility for hospice care.   09/28/22-09/29/22 -DSS evaluating for emergency guardianship at which point patient can be discharged once a facility has been obtained.  09/30/22-10/01/22 Patient has obtained emergency guardianship and is now awaiting placement.  Still requires sitter at bedside, plan to start Seroquel to assist with behavioral disturbances.  10/02/22 Plan to increase Seroquel dosing to 50 mg twice daily due to ongoing agitation and ongoing need for sitter.  10/03/22 Increase Seroquel to 100 mg twice daily with ongoing agitation noted overnight.  10/04/22 Increase Seroquel to 150 mg twice daily and continue sitter.  Assessment & Plan:   Principal Problem:   Acute metabolic encephalopathy Active Problems:   Bipolar 1 disorder (HCC)   Dysphagia   Lithium toxicity   Goals of care, counseling/discussion   Severe/Advanced Dementia with behavioral disturbance (HCC)   FTT (failure to thrive) in adult   Hypernatremia   Hyperlipidemia   Prolonged QT interval   Protein-calorie malnutrition, severe  Assessment and Plan:   1)Lithium Toxicity Resolved   - Lithium level 1.51 on admission -- lithium level normalized and dropped -No change in mentation,-not interactive, unresponsive   -Status post psych/TTS evaluation - appreciate tele-psych rec's; he has been taken off lithium and started on depakote; continue p.o. for now and if necessary,  transitioned to IV   - [name]: Cachectic, unresponsive, not interactive, poor p.o. intake - poor QOL with  decline as evidenced by severe cachexia;  -  DNR -palliative following     2) Advanced dementia with significant cognitive and memory deficits-encephalopathic -progressive decline-in setting of dementia   -Neurology consulted: Reviewing and evaluating for reversible causes-unlikely change in mentation at this point -Psych/CTS evaluation, adjusting medication - unlikely to reverse his condition at this point   -patient significant other [name]  recalls that the patient has had significant memory and cognitive decline over the last several years -Overall 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 -The last time he was able to put a couple words together in a phrase was apparently around June 2023 when he said that the garage where he used to do his Dealer work was on [address] -otherwise over the last 12 months patient may answer with one-word answers, significant cognitive decline -Patient lost over 40 pounds of weight due to poor oral intake -Recount initiated on 1/11     3)Goals of care, counseling/discussion - Long conversation held with [place] on 09/10/2022 and again on 09/13/2022, September 19, 2022   -Ongoing discussion daily with palliative care team -Ethic committee consulted, ongoing discussion For committee and Ms. Verner Chol -recommended trial of Dobbhoff feeding tube placement and tube feeding -      -No improvement-minimally interactive, impulsive,-continue to pull out IVs telemetry lines -As needed Ativan, Haldol -No PO nutritional intake -IV fluid limited  as he is pulling the line out   -Palliative care team, and ethics committee consulted: Recommended: Attempt Dobbhoff tube and tube feeds if fails progress to comfort care Recommended Neuro consultation   - patient has been  hospitalized for days at that point and he has shown no signs of improvement.  He remains minimally interactive and impulsive, constantly trying to climb out of bed.  He has pulled IV out as well as telemetry off repetitively.   -He also has had extremely minimal nutritional intake since admission.    -WE do not find an to be a good candidate for G- tube placement as he will inevitably pull this out as well and PEG tube placement also considered medically futile and inappropriate in context of his multiple comorbidities, poor quality of life, and progressive functional decline   - Discussed with Barbaraann Share that he does not appear to have the physical reserve to overcome the events that have led up to this hospitalization.  He may also not survive this hospitalization.       - [name] agrees with DNR status at this time. She does wish for ongoing attempts/treatment to see if he improves, but should he continue to decline (which I expect), then a natural death will be allowed. He is currently not a rehab candidate unless were to show tremendous improvement and rather he appears more appropriate for inpatient vs residential hospice depending on course of events over the next few days -Overall prognosis remains poor/grave -Family contact--   Richardean Chimera [phone] -As per [name] .... No living family members.  -Palliative working on contacting Ms. Scales       4)Hypernatremia -resolved   5)Acute metabolic encephalopathy in setting of baseline advanced/severe dementia with significant cognitive and memory deficits at baseline - Etiology attributed to probable lithium toxicity - CT head unremarkable for acute findings.  Chronic left frontal lobe infarct appreciated -MRI brain also negative for acute stroke -Patient is reported to have some altered mentation at baseline as well; per girlfriend,  he is mostly nonverbal at baseline----please see #2 above -Continue sitter -Continue assist of  nursing staff -Trial of lactulose for elevated ammonia level -Continue folate repletion -Depakote as adjusted per behavioral health   6)Dysphagia -Multifactorial including lithium toxicity, psychiatric disorder refusing oral intake -First oral intake 09/25/2022   -Per ethic committee trial of Dobbhoff tube place and enteric feeding if it fails we will proceed with comfort care   - Recent EGD June 2023, cannot see results, but per GI had gastritis and Candida esophagitis - unfortunately no signs of improvement still - see GOC - evaluated by SLP - continue offering diet as able; poor candidate for NGT and PEG candidate; not appropriate for TPN (No clear goals for long-term solution) -Pulled out Dobbhoff tube     Leukocytosis -likely reactive-resolved -Chest x-ray on 09/17/2022 without acute findings -Get blood cultures, check UA -Hold off on antibiotics   Prolonged QT interval - Likely related to elevated lithium level - patient pulling off tele too much for meaning capture; okay to d/c - serial EKGs as he allows too - Hold QT prolonging agents when possible   Hyperlipidemia -discontinued statin, risk outweighs the benefit for this patient   Bipolar 1 disorder ([place]) - Discontinued lithium, per psych recommendation on Cogentin, IV Depakote - haldol / ativan PRN   Contamination of blood culture-resolved as of 09/09/2022 - 1/4 bottles noted with GPC from 12/20 but apparently nothing seen on media per micro.  Has remained afebrile with minimal leukocytosis (considered reactive from other processes) - Will Repeat blood culture if becomes febrile      DVT prophylaxis:Lovenox Code Status: Full Family Communication: Tried calling significant other with no response 1/12 Disposition Plan:  Status is: Inpatient Remains inpatient appropriate because: Need for ongoing close monitoring and IV medications.   Consultants:  Palliative Neurology Behavioral health Ethics  Procedures:   None  Antimicrobials:  Anti-infectives (From admission, onward)    Start     Dose/Rate Route Frequency Ordered Stop   09/06/22 0900  voriconazole (VFEND) tablet 200 mg  Status:  Discontinued        200 mg Oral  Once 09/05/22 1941 09/07/22 0815       Subjective: Patient seen and evaluated today and is nonverbal.  He tolerates minimal oral intake.  Girlfriend could not be contacted.  Objective: Vitals:   10/03/22 0519 10/03/22 1400 10/04/22 0635 10/04/22 0638  BP: (!) 136/90 111/63 107/74   Pulse: (!) 53  (!) 54   Resp: 20 18    Temp: 98.5 F (36.9 C)     TempSrc:      SpO2:  100%    Weight:    51.4 kg  Height:        Intake/Output Summary (Last 24 hours) at 10/04/2022 0959 Last data filed at 10/04/2022 0100 Gross per 24 hour  Intake 0 ml  Output 2300 ml  Net -2300 ml   Filed Weights   10/02/22 0513 10/03/22 0500 10/04/22 8333  Weight: 49.1 kg 50.7 kg 51.4 kg    Examination:  General exam: Appears calm and comfortable, nonverbal Respiratory system: Clear to auscultation. Respiratory effort normal. Cardiovascular system: S1 & S2 heard, RRR.  Gastrointestinal system: Abdomen is soft Central nervous system: Alert and awake Extremities: No edema Skin: No significant lesions noted    Data Reviewed: I have personally reviewed following labs and imaging studies  CBC: Recent Labs  Lab 10/03/22 0410  WBC 3.1*  HGB 12.5*  HCT 40.9  MCV 78.7*  PLT 214   Basic Metabolic Panel: Recent Labs  Lab 10/03/22 0410  NA 139  K 4.6  CL 108  CO2 24  GLUCOSE 92  BUN 10  CREATININE 0.67  CALCIUM 9.0  MG 1.8   GFR: Estimated Creatinine Clearance: 69.6 mL/min (by C-G formula based on SCr of 0.67 mg/dL). Liver Function Tests: Recent Labs  Lab 10/03/22 0410  AST 34  ALT 47*  ALKPHOS 62  BILITOT 0.3  PROT 6.7  ALBUMIN 2.9*   No results for input(s): "LIPASE", "AMYLASE" in the last 168 hours. No results for input(s): "AMMONIA" in the last 168  hours.  Coagulation Profile: No results for input(s): "INR", "PROTIME" in the last 168 hours. Cardiac Enzymes: No results for input(s): "CKTOTAL", "CKMB", "CKMBINDEX", "TROPONINI" in the last 168 hours. BNP (last 3 results) No results for input(s): "PROBNP" in the last 8760 hours. HbA1C: No results for input(s): "HGBA1C" in the last 72 hours. CBG: Recent Labs  Lab 10/04/22 0020  GLUCAP 69*    Lipid Profile: No results for input(s): "CHOL", "HDL", "LDLCALC", "TRIG", "CHOLHDL", "LDLDIRECT" in the last 72 hours. Thyroid Function Tests: No results for input(s): "TSH", "T4TOTAL", "FREET4", "T3FREE", "THYROIDAB" in the last 72 hours. Anemia Panel: No results for input(s): "VITAMINB12", "FOLATE", "FERRITIN", "TIBC", "IRON", "RETICCTPCT" in the last 72 hours. Sepsis Labs: No results for input(s): "PROCALCITON", "LATICACIDVEN" in the last 168 hours.  No results found for this or any previous visit (  from the past 240 hour(s)).       Radiology Studies: No results found.      Scheduled Meds:  amphetamine-dextroamphetamine  10 mg Oral Q breakfast   benztropine  0.5 mg Oral BID   Or   benztropine mesylate  0.5 mg Intramuscular BID   divalproex  250 mg Oral Q8H   enoxaparin (LOVENOX) injection  40 mg Subcutaneous Q24H   folic acid  1 mg Oral Daily   megestrol  200 mg Oral BID   nystatin   Topical BID   mouth rinse  15 mL Mouth Rinse 4 times per day   QUEtiapine  150 mg Oral BID   Continuous Infusions:  dextrose 5 % and 0.9% NaCl 75 mL/hr at 10/03/22 1012     LOS: 28 days    Time spent: 35 minutes    Doris Gruhn Hoover Brunette, DO [facility]  If 7PM-7AM, please contact night-coverage [email or web address] 10/04/2022, 9:59 AM

## 2022-10-05 DIAGNOSIS — G9341 Metabolic encephalopathy: Secondary | ICD-10-CM | POA: Diagnosis not present

## 2022-10-05 MED ORDER — PANTOPRAZOLE SODIUM 40 MG PO TBEC
40.0000 mg | DELAYED_RELEASE_TABLET | Freq: Every day | ORAL | Status: DC
  Administered 2022-10-05 – 2022-10-27 (×22): 40 mg via ORAL
  Filled 2022-10-05 (×23): qty 1

## 2022-10-05 MED ORDER — QUETIAPINE FUMARATE 25 MG PO TABS
200.0000 mg | ORAL_TABLET | Freq: Two times a day (BID) | ORAL | Status: DC
  Administered 2022-10-05 – 2022-10-09 (×9): 200 mg via ORAL
  Filled 2022-10-05 (×8): qty 8

## 2022-10-05 MED ORDER — ALUM & MAG HYDROXIDE-SIMETH 200-200-20 MG/5ML PO SUSP
30.0000 mL | Freq: Four times a day (QID) | ORAL | Status: DC | PRN
  Filled 2022-10-05: qty 30

## 2022-10-05 NOTE — Progress Notes (Signed)
Pt was alert this morning and able to tell him his name. Pt was able to ambulate in the hallway with two assist and was sat in the chair to eat breakfast. Pt ate 100% of his breakfast and ate 4 apple sauces. Pt was cleaned up by NT sitter.

## 2022-10-05 NOTE — Progress Notes (Signed)
Pt up in recliner chair from bed with assistance by Probation officer and Marianna Payment LPN .assisted his breakfast tray. Pt consumed 100% of tray including ensure and both thickened water and orange juice cups washed face and changed gown and bed linen. Pt is back in the bed sleeping quietly at this time. Actuary Regulatory affairs officer) remains at bedside.

## 2022-10-05 NOTE — Progress Notes (Signed)
PROGRESS NOTE    [name]  EVO:[id number] DOB: June 08, 1961 DOA: 09/05/2022 PCP: [facility]   Brief Narrative:    [name] is a 62 yo male with PMH dementia, bipolar 1 disorder, HLD, HTN, CAD, CVA who presented with confusion and dysphagia. With further workup he was also found to be agitated/impulsive, and unable to be redirected.  He required Haldol and Ativan.  His significant other was concerned about his inability to swallow and he was brought for further workup. Because of the difficulty eating/dysphagia, he was initially initiated on a stroke workup.  However, further workup also was notable for an elevated lithium level.  Given his presenting symptoms, it was felt that they were attributed to lithium toxicity.  Poison control was called and patient was started on fluids and admitted. Despite aggressive measures, he showed little improvement during hospitalization.  Given his poor physical reserve and declining functional status leading up to hospitalization, he appeared to be more consistent with failure to thrive.  Goals of care discussions were held with his significant other who is his main caretaker and decision maker.  He was made DNR and continued on medical treatment in the hopes for some improvement however his prognosis was made clear that it appears to be worsening daily.  Palliative continues to have ongoing discussions with the girlfriend and ethics is involved.    09/18/22 -Had face-to-face Conference with patient's girlfriend on 09/13/2022 -Patient girlfriend requested that I do Not call her to talk to her about patient's poor prognosis anymore, because it makes her sad.- -Patient's girlfriend is accusing the medical team of "prematurely giving up on patient" --She tells me that she is not ready to make any decisions about possible de-escalation of care until the middle of January at the earliest -oral intake remains very  poor -Hypoglycemia with blood sugar of 69 noted despite continuous IV dextrose drip-- -will increase IV dextrose rate -Overall prognosis remains poor given very poor oral intake due to persistent metabolic encephalopathy superimposed on advanced dementia   09/19/22 -Remain calm comfortable, remained calm, comfortable, sleepy -Refused to eat or to be fed   09/20/2022 -Agitated confused overnight, Ativan and Haldol was used overnight -Appreciate palliative care following, and consulted ethics committee Current recommendation is neuroconsultation, attempt of Dobbhoff placement and initiation of tube feeding Fails we will proceed with comfort care     09/21/2022 -Some, minimal interaction between him and the nursing staff noted this morning with some oral intake -Plan remains for Dobbhoff placement per ethics committee recommendations Nutrition to be consulted for tube feeds -Psych has been consulted for evaluation -Pending neuro eval   09/22/2022 -Patient received Dobbhoff feeding tube placement by IR on 09/21/2022 -Nutrition has started tube feeding -Neurology has evaluated the patient-looking for reversible causes -Agitated overnight received Ativan around 3 AM Unresponsive non-interactive this a.m.   09/23/2022 -Patient was seen and examined -Agitated earlier per nursing staff -Dobbhoff tube still in place, tube feeds,     09/24/2022 -A bit more responsive to the nursing staff at bedside was encouraging oral intake -Drop-offs still present -tolerating tube feeds     09/25/2022 -Opens his eyes but does not follow command, nonverbal, noncommunicating -Per nursing staff agitated earlier this morning, has had p.o. intake for first time today -Dobbhoff feeding tube still in place   09/26/22 -Patient is nonverbal and appears to have pulled out feeding tube on 1/9 -Appears to tolerating some oral intake -Trial of lactulose today due  to elevated ammonia levels -Depakote recently increased  by behavioral health -Trial of Megace to see if this helps to improve appetite -Continue folate repletion -Appears to be having more of a failure to thrive picture and is appropriate for hospice  09/27/22 -Calorie count initiated to help see if pt meeting caloric needs -As per Neurology note, patient is an appropriate candidate for hospice. Since next of kin cannot be contacted at this point, attempts will be made to transfer to facility for hospice care.   09/28/22-09/29/22 -DSS evaluating for emergency guardianship at which point patient can be discharged once a facility has been obtained.  09/30/22-10/01/22 Patient has obtained emergency guardianship and is now awaiting placement.  Still requires sitter at bedside, plan to start Seroquel to assist with behavioral disturbances.  10/02/22 Plan to increase Seroquel dosing to 50 mg twice daily due to ongoing agitation and ongoing need for sitter.  10/03/22 Increase Seroquel to 100 mg twice daily with ongoing agitation noted overnight.  10/04/22 Increase Seroquel to 150 mg twice daily and continue sitter.  10/05/22 Increase Seroquel to 200 mg twice daily and continue sitter.  TTS evaluation to see if there could be some assistance in helping manage his behavior.  Trying to avoid the need for sitter so placement can be pursued.  Assessment & Plan:   Principal Problem:   Acute metabolic encephalopathy Active Problems:   Bipolar 1 disorder (HCC)   Dysphagia   Lithium toxicity   Goals of care, counseling/discussion   Severe/Advanced Dementia with behavioral disturbance (HCC)   FTT (failure to thrive) in adult   Hypernatremia   Hyperlipidemia   Prolonged QT interval   Protein-calorie malnutrition, severe  Assessment and Plan:   1)Lithium Toxicity Resolved   - Lithium level 1.51 on admission -- lithium level normalized and dropped -No change in mentation,-not interactive, unresponsive   -Status post psych/TTS evaluation -  appreciate tele-psych rec's; he has been taken off lithium and started on depakote; continue p.o. for now and if necessary,  transitioned to IV   - [name]: [name], unresponsive, not interactive, poor p.o. 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 -The last time he was able to put a couple words together in a phrase was apparently around June 2023 when he said that the garage where he used to do his Curator work was on [employer] -otherwise over the last 12 months patient may answer with one-word answers, significant cognitive decline -Patient lost over 40 pounds of weight due to poor oral intake -Recount initiated on 1/11     3)Goals of care, counseling/discussion - Long conversation held with [place] on 09/10/2022 and again on 09/13/2022, September 19, 2022   -Ongoing discussion daily with palliative care team -Ethic committee consulted, ongoing discussion For  committee and Ms. Verner Chol -recommended trial of Dobbhoff feeding tube placement and tube feeding -      -No improvement-minimally interactive, impulsive,-continue to pull out IVs telemetry lines -As needed Ativan, Haldol -No PO nutritional intake -IV fluid limited as  he is pulling the line out   -Palliative care team, and ethics committee consulted: Recommended: Attempt Dobbhoff tube and tube feeds if fails progress to comfort care Recommended Neuro consultation   - patient has been hospitalized for days at that point and he has shown no signs of improvement.  He remains minimally interactive and impulsive, constantly trying to climb out of bed.  He has pulled IV out as well as telemetry off repetitively.   -He also has had extremely minimal nutritional intake since admission.    -WE do not find an to be a good candidate for G- tube placement as he will inevitably pull this out as well and PEG tube placement also considered medically futile and inappropriate in context of his multiple comorbidities, poor quality of life, and progressive functional decline   - Discussed with Barbaraann Share that he does not appear to have the physical reserve to overcome the events that have led up to this hospitalization.  He may also not survive this hospitalization.       - [name] agrees with DNR status at this time. She does wish for ongoing attempts/treatment to see if he improves, but should he continue to decline (which I expect), then a natural death will be allowed. He is currently not a rehab candidate unless were to show tremendous improvement and rather he appears more appropriate for inpatient vs residential hospice depending on course of events over the next few days -Overall prognosis remains poor/grave -Family contact--   Richardean Chimera [phone] -As per [name] .... No living family members.  -Palliative working on contacting Ms. Scales       4)Hypernatremia -resolved   5)Acute metabolic encephalopathy in setting of baseline advanced/severe dementia with significant cognitive and memory deficits at baseline - Etiology attributed to probable lithium toxicity - CT head unremarkable for acute findings.  Chronic left frontal lobe infarct appreciated -MRI  brain also negative for acute stroke -Patient is reported to have some altered mentation at baseline as well; per girlfriend,  he is mostly nonverbal at baseline----please see #2 above -Continue sitter -Continue assist of nursing staff -Trial of lactulose for elevated ammonia level -Continue folate repletion -Depakote as adjusted per behavioral health   6)Dysphagia -Multifactorial including lithium toxicity, psychiatric disorder refusing oral intake -First oral intake 09/25/2022   -Per ethic committee trial of Dobbhoff tube place and enteric feeding if it fails we will proceed with comfort care   - Recent EGD June 2023, cannot see results, but per GI had gastritis and Candida esophagitis - unfortunately no signs of improvement still - see GOC - evaluated by SLP - continue offering diet as able; poor candidate for NGT and PEG candidate; not appropriate for TPN (No clear goals for long-term solution) -Pulled out Dobbhoff tube     Leukocytosis -likely reactive-resolved -Chest x-ray on 09/17/2022 without acute findings -Get blood cultures, check UA -Hold off on antibiotics   Prolonged QT interval - Likely related to elevated lithium level - patient pulling off tele too much for meaning capture; okay to d/c - serial EKGs as he allows too - Hold QT prolonging agents when possible   Hyperlipidemia -discontinued statin, risk outweighs the benefit for this patient   Bipolar 1 disorder (  [place]) - Discontinued lithium, per psych recommendation on Cogentin, IV Depakote - haldol / ativan PRN   Contamination of blood culture-resolved as of 09/09/2022 - 1/4 bottles noted with GPC from 12/20 but apparently nothing seen on media per micro. Has remained afebrile with minimal leukocytosis (considered reactive from other processes) - Will Repeat blood culture if becomes febrile      DVT prophylaxis:Lovenox Code Status: Full Family Communication: Tried calling significant other with no response  on multiple days Disposition Plan:  Status is: Inpatient Remains inpatient appropriate because: Need for ongoing close monitoring and IV medications.   Consultants:  Palliative Neurology Behavioral health Ethics TTS 1/19  Procedures:  None  Antimicrobials:  Anti-infectives (From admission, onward)    Start     Dose/Rate Route Frequency Ordered Stop   09/06/22 0900  voriconazole (VFEND) tablet 200 mg  Status:  Discontinued        200 mg Oral  Once 09/05/22 1941 09/07/22 0815       Subjective: Patient seen and evaluated today and he is able to verbally interact and has minimal oral intake.  He continues to remain agitated and tries to get out of bed requiring a sitter.  Objective: Vitals:   10/04/22 0635 10/04/22 0638 10/04/22 1429 10/05/22 0611  BP: 107/74  96/68 117/73  Pulse: (!) 54  60 77  Resp:   18 20  Temp:   98.7 F (37.1 C) 98.7 F (37.1 C)  TempSrc:   Oral Oral  SpO2:   100% 100%  Weight:  51.4 kg  48.7 kg  Height:        Intake/Output Summary (Last 24 hours) at 10/05/2022 0835 Last data filed at 10/05/2022 0700 Gross per 24 hour  Intake 240 ml  Output 600 ml  Net -360 ml   Filed Weights   10/03/22 0500 10/04/22 0638 10/05/22 0611  Weight: 50.7 kg 51.4 kg 48.7 kg    Examination:  General exam: Appears calm and comfortable, trying to get out of bed Respiratory system: Clear to auscultation. Respiratory effort normal. Cardiovascular system: S1 & S2 heard, RRR.  Gastrointestinal system: Abdomen is soft Central nervous system: Alert and awake Extremities: No edema Skin: No significant lesions noted    Data Reviewed: I have personally reviewed following labs and imaging studies  CBC: Recent Labs  Lab 10/03/22 0410  WBC 3.1*  HGB 12.5*  HCT 40.9  MCV 78.7*  PLT 937   Basic Metabolic Panel: Recent Labs  Lab 10/03/22 0410  NA 139  K 4.6  CL 108  CO2 24  GLUCOSE 92  BUN 10  CREATININE 0.67  CALCIUM 9.0  MG 1.8    GFR: Estimated Creatinine Clearance: 65.9 mL/min (by C-G formula based on SCr of 0.67 mg/dL). Liver Function Tests: Recent Labs  Lab 10/03/22 0410  AST 34  ALT 47*  ALKPHOS 62  BILITOT 0.3  PROT 6.7  ALBUMIN 2.9*   No results for input(s): "LIPASE", "AMYLASE" in the last 168 hours. No results for input(s): "AMMONIA" in the last 168 hours.  Coagulation Profile: No results for input(s): "INR", "PROTIME" in the last 168 hours. Cardiac Enzymes: No results for input(s): "CKTOTAL", "CKMB", "CKMBINDEX", "TROPONINI" in the last 168 hours. BNP (last 3 results) No results for input(s): "PROBNP" in the last 8760 hours. HbA1C: No results for input(s): "HGBA1C" in the last 72 hours. CBG: Recent Labs  Lab 10/04/22 0020  GLUCAP 69*    Lipid Profile: No results for input(s): "CHOL", "HDL", "LDLCALC", "  TRIG", "CHOLHDL", "LDLDIRECT" in the last 72 hours. Thyroid Function Tests: No results for input(s): "TSH", "T4TOTAL", "FREET4", "T3FREE", "THYROIDAB" in the last 72 hours. Anemia Panel: No results for input(s): "VITAMINB12", "FOLATE", "FERRITIN", "TIBC", "IRON", "RETICCTPCT" in the last 72 hours. Sepsis Labs: No results for input(s): "PROCALCITON", "LATICACIDVEN" in the last 168 hours.  No results found for this or any previous visit (from the past 240 hour(s)).       Radiology Studies: No results found.      Scheduled Meds:  amphetamine-dextroamphetamine  10 mg Oral Q breakfast   benztropine  0.5 mg Oral BID   Or   benztropine mesylate  0.5 mg Intramuscular BID   divalproex  250 mg Oral Q8H   enoxaparin (LOVENOX) injection  40 mg Subcutaneous Q24H   folic acid  1 mg Oral Daily   megestrol  200 mg Oral BID   nystatin   Topical BID   mouth rinse  15 mL Mouth Rinse 4 times per day   QUEtiapine  200 mg Oral BID   Continuous Infusions:  dextrose 5 % and 0.9% NaCl 75 mL/hr at 10/05/22 0549     LOS: 29 days    Time spent: 35 minutes    Sapir Lavey Hoover Brunette, DO [facility]  If 7PM-7AM, please contact night-coverage [email or web address] 10/05/2022, 8:35 AM

## 2022-10-06 DIAGNOSIS — G9341 Metabolic encephalopathy: Secondary | ICD-10-CM | POA: Diagnosis not present

## 2022-10-06 LAB — BASIC METABOLIC PANEL
Anion gap: 2 — ABNORMAL LOW (ref 5–15)
BUN: 13 mg/dL (ref 8–23)
CO2: 24 mmol/L (ref 22–32)
Calcium: 8.7 mg/dL — ABNORMAL LOW (ref 8.9–10.3)
Chloride: 110 mmol/L (ref 98–111)
Creatinine, Ser: 0.67 mg/dL (ref 0.61–1.24)
GFR, Estimated: >60 mL/min (ref >60)
Glucose, Bld: 97 mg/dL (ref 70–99)
Potassium: 4.3 mmol/L (ref 3.5–5.1)
Sodium: 136 mmol/L (ref 135–145)

## 2022-10-06 LAB — CBC
HCT: 38.9 % — ABNORMAL LOW (ref 39.0–52.0)
Hemoglobin: 12.2 g/dL — ABNORMAL LOW (ref 13.0–17.0)
MCH: 24.3 pg — ABNORMAL LOW (ref 26.0–34.0)
MCHC: 31.4 g/dL (ref 30.0–36.0)
MCV: 77.5 fL — ABNORMAL LOW (ref 80.0–100.0)
Platelets: 206 10*3/uL (ref 150–400)
RBC: 5.02 MIL/uL (ref 4.22–5.81)
RDW: 15.8 % — ABNORMAL HIGH (ref 11.5–15.5)
WBC: 3.8 10*3/uL — ABNORMAL LOW (ref 4.0–10.5)
nRBC: 0 % (ref 0.0–0.2)

## 2022-10-06 LAB — VALPROIC ACID LEVEL: Valproic Acid Lvl: 48 ug/mL — ABNORMAL LOW (ref 50.0–100.0)

## 2022-10-06 LAB — MAGNESIUM: Magnesium: 1.8 mg/dL (ref 1.7–2.4)

## 2022-10-06 NOTE — Consult Note (Signed)
  HPI: Per admission assessment note:  "[name] is a 62 year old male with past history of dementia, bipolar 1 disorder, HLD, HTN, CAD, CVA who presented to [facility] via family with confusion and dysphagia where patient was noted to have lithium toxicity. Lithium level 1.51 on admission. Based on predominate neuro signs and prolonged qTc patient believed to have chronic toxicity. He remains on medical unit where he continues to require 1:1 care due to continued agitation."    Assessment: NP reached out to patient's RN for tele-assessment, awaiting for chart to be placed in patient's room. Consults was placed to due to reported increased agitation. This provider spoke to [facility], regarding medication recommendation.    Medications:  Bipolar 1 disorder:     - Stop:              - Lithium 300 mg q12 hrs    - Continue:              - Depakote 250 mg q12 hrs                         - may increase every 3rd day  after obtaining labs.  - Lab ordered for 10/06/2022  Consider increasing night time dose of Depakote 250 mg to 500 mg nightly and Continue Depakote Sprinkles 250 mg  BID   Labs reviewed: AST/ALT: 34/47, Creatinine Ser 0.67- Results pending for Valproic Level orders placed 10/06/2022 Please update EKG results last updated was 09/07/22   Agitation:    - Discontinue:  - Haldol 2 mg IV q 6 hrs PRN   Agitation if Qtc is prolonged.    Patient remains under care of medical team at this time , Please consider consulting Neurology and Psychiatry will remain available for any future recommendations as needed.

## 2022-10-06 NOTE — Progress Notes (Signed)
Patient had some restlessness throughout the night, patient given PRN for restlessness at 2200 and again @ 5am , 1:1 present with  safety precautions in place.

## 2022-10-06 NOTE — Progress Notes (Signed)
PROGRESS NOTE    [name]  EVO:[id number] DOB: June 08, 1961 DOA: 09/05/2022 PCP: [facility]   Brief Narrative:    [name] is a 62 yo male with PMH dementia, bipolar 1 disorder, HLD, HTN, CAD, CVA who presented with confusion and dysphagia. With further workup he was also found to be agitated/impulsive, and unable to be redirected.  He required Haldol and Ativan.  His significant other was concerned about his inability to swallow and he was brought for further workup. Because of the difficulty eating/dysphagia, he was initially initiated on a stroke workup.  However, further workup also was notable for an elevated lithium level.  Given his presenting symptoms, it was felt that they were attributed to lithium toxicity.  Poison control was called and patient was started on fluids and admitted. Despite aggressive measures, he showed little improvement during hospitalization.  Given his poor physical reserve and declining functional status leading up to hospitalization, he appeared to be more consistent with failure to thrive.  Goals of care discussions were held with his significant other who is his main caretaker and decision maker.  He was made DNR and continued on medical treatment in the hopes for some improvement however his prognosis was made clear that it appears to be worsening daily.  Palliative continues to have ongoing discussions with the girlfriend and ethics is involved.    09/18/22 -Had face-to-face Conference with patient's girlfriend on 09/13/2022 -Patient girlfriend requested that I do Not call her to talk to her about patient's poor prognosis anymore, because it makes her sad.- -Patient's girlfriend is accusing the medical team of "prematurely giving up on patient" --She tells me that she is not ready to make any decisions about possible de-escalation of care until the middle of January at the earliest -oral intake remains very  poor -Hypoglycemia with blood sugar of 69 noted despite continuous IV dextrose drip-- -will increase IV dextrose rate -Overall prognosis remains poor given very poor oral intake due to persistent metabolic encephalopathy superimposed on advanced dementia   09/19/22 -Remain calm comfortable, remained calm, comfortable, sleepy -Refused to eat or to be fed   09/20/2022 -Agitated confused overnight, Ativan and Haldol was used overnight -Appreciate palliative care following, and consulted ethics committee Current recommendation is neuroconsultation, attempt of Dobbhoff placement and initiation of tube feeding Fails we will proceed with comfort care     09/21/2022 -Some, minimal interaction between him and the nursing staff noted this morning with some oral intake -Plan remains for Dobbhoff placement per ethics committee recommendations Nutrition to be consulted for tube feeds -Psych has been consulted for evaluation -Pending neuro eval   09/22/2022 -Patient received Dobbhoff feeding tube placement by IR on 09/21/2022 -Nutrition has started tube feeding -Neurology has evaluated the patient-looking for reversible causes -Agitated overnight received Ativan around 3 AM Unresponsive non-interactive this a.m.   09/23/2022 -Patient was seen and examined -Agitated earlier per nursing staff -Dobbhoff tube still in place, tube feeds,     09/24/2022 -A bit more responsive to the nursing staff at bedside was encouraging oral intake -Drop-offs still present -tolerating tube feeds     09/25/2022 -Opens his eyes but does not follow command, nonverbal, noncommunicating -Per nursing staff agitated earlier this morning, has had p.o. intake for first time today -Dobbhoff feeding tube still in place   09/26/22 -Patient is nonverbal and appears to have pulled out feeding tube on 1/9 -Appears to tolerating some oral intake -Trial of lactulose today due  to elevated ammonia levels -Depakote recently increased  by behavioral health -Trial of Megace to see if this helps to improve appetite -Continue folate repletion -Appears to be having more of a failure to thrive picture and is appropriate for hospice  09/27/22 -Calorie count initiated to help see if pt meeting caloric needs -As per Neurology note, patient is an appropriate candidate for hospice. Since next of kin cannot be contacted at this point, attempts will be made to transfer to facility for hospice care.   09/28/22-09/29/22 -DSS evaluating for emergency guardianship at which point patient can be discharged once a facility has been obtained.  09/30/22-10/01/22 Patient has obtained emergency guardianship and is now awaiting placement.  Still requires sitter at bedside, plan to start Seroquel to assist with behavioral disturbances.  10/02/22 Plan to increase Seroquel dosing to 50 mg twice daily due to ongoing agitation and ongoing need for sitter.  10/03/22 Increase Seroquel to 100 mg twice daily with ongoing agitation noted overnight.  10/04/22 Increase Seroquel to 150 mg twice daily and continue sitter.  10/05/22 Increase Seroquel to 200 mg twice daily and continue sitter.  TTS evaluation to see if there could be some assistance in helping manage his behavior.  Trying to avoid the need for sitter so placement can be pursued.  10/06/22: The patient was seen and examined at his bedside.  Does not answer questions.  Per sitter in the room he was communicating with her earlier.  Ate without any difficulties.  Assessment & Plan:   Principal Problem:   Acute metabolic encephalopathy Active Problems:   Bipolar 1 disorder (HCC)   Dysphagia   Lithium toxicity   Goals of care, counseling/discussion   Severe/Advanced Dementia with behavioral disturbance (HCC)   FTT (failure to thrive) in adult   Hypernatremia   Hyperlipidemia   Prolonged QT interval   Protein-calorie malnutrition, severe  Assessment and Plan:   1)Lithium  Toxicity Resolved   - Lithium level 1.51 on admission -- lithium level normalized and dropped -No change in mentation,-not interactive, unresponsive   -Status post psych/TTS evaluation - appreciate tele-psych rec's; he has been taken off lithium and started on depakote; continue p.o. for now and if necessary,  transitioned to IV   - [name]: Cachectic, unresponsive, not interactive, poor p.o. 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 Verner Chol -recommended trial of Dobbhoff feeding tube placement  and tube feeding -      -No improvement-minimally interactive, impulsive,-continue to pull out IVs telemetry lines -As needed Ativan, Haldol -No PO nutritional intake -IV fluid limited as he is pulling the line out   -Palliative care team, and ethics committee consulted: Recommended: Attempt Dobbhoff tube and tube feeds if fails progress to comfort care Recommended Neuro consultation   - patient has been hospitalized for days at that point and he has shown no signs of improvement.  He remains minimally interactive and impulsive, constantly trying to climb out of bed.  He has pulled IV out as well as telemetry off repetitively.   -He also has had extremely minimal nutritional intake since admission.    -WE do not find an to be a good candidate for G- tube placement as he will inevitably pull this out as well and PEG tube placement also considered medically futile and inappropriate in context of his multiple comorbidities, poor quality of life, and progressive functional decline   - Discussed with Barbaraann Share that he does not appear to have the physical reserve to overcome the events that have led up to this hospitalization.  He may also not survive this hospitalization.       - [name] agrees with DNR status at this time. She does wish for ongoing attempts/treatment to see if he improves, but should he continue to decline (which I expect), then a natural death will be allowed. He is currently not a rehab candidate unless were to show tremendous improvement and rather he appears more appropriate for inpatient vs residential hospice depending on course of events over the next few days -Overall prognosis remains poor/grave -Family contact--   Richardean Chimera [phone] -As per [name] .... No living family members.  -Palliative working on contacting Ms. Scales       4)Hypernatremia -resolved   5)Acute metabolic encephalopathy in setting of baseline advanced/severe dementia with  significant cognitive and memory deficits at baseline - Etiology attributed to probable lithium toxicity - CT head unremarkable for acute findings.  Chronic left frontal lobe infarct appreciated -MRI brain also negative for acute stroke -Patient is reported to have some altered mentation at baseline as well; per girlfriend,  he is mostly nonverbal at baseline----please see #2 above -Continue sitter -Continue assist of nursing staff -Trial of lactulose for elevated ammonia level -Continue folate repletion -Depakote as adjusted per behavioral health   6)Dysphagia -Multifactorial including lithium toxicity, psychiatric disorder refusing oral intake -First oral intake 09/25/2022   -Per ethic committee trial of Dobbhoff tube place and enteric feeding if it fails we will proceed with comfort care   - Recent EGD June 2023, cannot see results, but per GI had gastritis and Candida esophagitis - unfortunately no signs of improvement still - see GOC - evaluated by SLP - continue offering diet as able; poor candidate for NGT and PEG candidate; not appropriate for TPN (No clear goals for long-term solution) -Pulled out Dobbhoff tube     Leukocytosis -likely reactive-resolved -Chest x-ray on 09/17/2022 without acute findings -Get blood cultures, check UA -Hold off on antibiotics   Prolonged QT interval - Likely related to elevated lithium level - patient pulling off tele too much for meaning capture; okay  to d/c - serial EKGs as he allows too - Hold QT prolonging agents when possible   Hyperlipidemia -discontinued statin, risk outweighs the benefit for this patient   Bipolar 1 disorder (HCC) - Discontinued lithium, per psych recommendation on Cogentin, IV Depakote - haldol / ativan PRN   Contamination of blood culture-resolved as of 09/09/2022 - 1/4 bottles noted with GPC from 12/20 but apparently nothing seen on media per micro. Has remained afebrile with minimal leukocytosis (considered  reactive from other processes) - Will Repeat blood culture if becomes febrile      DVT prophylaxis:Lovenox Code Status: Full Family Communication: Tried calling significant other with no response on multiple days Disposition Plan:  Status is: Inpatient Remains inpatient appropriate because: Need for ongoing close monitoring and IV medications.   Consultants:  Palliative Neurology Behavioral health Ethics TTS 1/19  Procedures:  None  Antimicrobials:  Anti-infectives (From admission, onward)    Start     Dose/Rate Route Frequency Ordered Stop   09/06/22 0900  voriconazole (VFEND) tablet 200 mg  Status:  Discontinued        200 mg Oral  Once 09/05/22 1941 09/07/22 0815       Objective: Vitals:   10/04/22 1429 10/05/22 0611 10/05/22 2140 10/06/22 0419  BP: 96/68 117/73 122/78   Pulse: 60 77 63   Resp: 18 20 20    Temp: 98.7 F (37.1 C) 98.7 F (37.1 C) 98.6 F (37 C)   TempSrc: Oral Oral Oral   SpO2: 100% 100% 100%   Weight:  48.7 kg  49.6 kg  Height:        Intake/Output Summary (Last 24 hours) at 10/06/2022 1212 Last data filed at 10/06/2022 1100 Gross per 24 hour  Intake 1260 ml  Output 2750 ml  Net -1490 ml   Filed Weights   10/04/22 0638 10/05/22 0611 10/06/22 0419  Weight: 51.4 kg 48.7 kg 49.6 kg    Examination:  General exam: Frail appearing no acute distress.   Respiratory system: Clear to auscultation no wheeze or rales Cardiovascular system: S1 & S2 heard, regular rate and rhythm Gastrointestinal system: Abdomen soft Central nervous system: Alert and awake. Refuses to participate Extremities: No edema Skin: No significant lesions noted    Data Reviewed: I have personally reviewed following labs and imaging studies  CBC: Recent Labs  Lab 10/03/22 0410 10/06/22 0536  WBC 3.1* 3.8*  HGB 12.5* 12.2*  HCT 40.9 38.9*  MCV 78.7* 77.5*  PLT 214 206   Basic Metabolic Panel: Recent Labs  Lab 10/03/22 0410 10/06/22 0536  NA 139 136   K 4.6 4.3  CL 108 110  CO2 24 24  GLUCOSE 92 97  BUN 10 13  CREATININE 0.67 0.67  CALCIUM 9.0 8.7*  MG 1.8 1.8   GFR: Estimated Creatinine Clearance: 67.2 mL/min (by C-G formula based on SCr of 0.67 mg/dL). Liver Function Tests: Recent Labs  Lab 10/03/22 0410  AST 34  ALT 47*  ALKPHOS 62  BILITOT 0.3  PROT 6.7  ALBUMIN 2.9*   No results for input(s): "LIPASE", "AMYLASE" in the last 168 hours. No results for input(s): "AMMONIA" in the last 168 hours.  Coagulation Profile: No results for input(s): "INR", "PROTIME" in the last 168 hours. Cardiac Enzymes: No results for input(s): "CKTOTAL", "CKMB", "CKMBINDEX", "TROPONINI" in the last 168 hours. BNP (last 3 results) No results for input(s): "PROBNP" in the last 8760 hours. HbA1C: No results for input(s): "HGBA1C" in the last 72 hours. CBG: Recent  Labs  Lab 10/04/22 0020  GLUCAP 69*    Lipid Profile: No results for input(s): "CHOL", "HDL", "LDLCALC", "TRIG", "CHOLHDL", "LDLDIRECT" in the last 72 hours. Thyroid Function Tests: No results for input(s): "TSH", "T4TOTAL", "FREET4", "T3FREE", "THYROIDAB" in the last 72 hours. Anemia Panel: No results for input(s): "VITAMINB12", "FOLATE", "FERRITIN", "TIBC", "IRON", "RETICCTPCT" in the last 72 hours. Sepsis Labs: No results for input(s): "PROCALCITON", "LATICACIDVEN" in the last 168 hours.  No results found for this or any previous visit (from the past 240 hour(s)).       Radiology Studies: No results found.      Scheduled Meds:  amphetamine-dextroamphetamine  10 mg Oral Q breakfast   benztropine  0.5 mg Oral BID   Or   benztropine mesylate  0.5 mg Intramuscular BID   divalproex  250 mg Oral Q8H   enoxaparin (LOVENOX) injection  40 mg Subcutaneous Q24H   folic acid  1 mg Oral Daily   megestrol  200 mg Oral BID   nystatin   Topical BID   mouth rinse  15 mL Mouth Rinse 4 times per day   pantoprazole  40 mg Oral Daily   QUEtiapine  200 mg Oral BID    Continuous Infusions:  dextrose 5 % and 0.9% NaCl 75 mL/hr at 10/06/22 0041     LOS: 30 days    Time spent: 35 minutes    Darlin Drop, DO [facility]  If 7PM-7AM, please contact night-coverage [email or web address] 10/06/2022, 12:12 PM

## 2022-10-07 ENCOUNTER — Inpatient Hospital Stay (HOSPITAL_COMMUNITY): Payer: Medicare Other

## 2022-10-07 DIAGNOSIS — G9341 Metabolic encephalopathy: Secondary | ICD-10-CM | POA: Diagnosis not present

## 2022-10-07 LAB — CBC
HCT: 38.2 % — ABNORMAL LOW (ref 39.0–52.0)
Hemoglobin: 11.8 g/dL — ABNORMAL LOW (ref 13.0–17.0)
MCH: 24.3 pg — ABNORMAL LOW (ref 26.0–34.0)
MCHC: 30.9 g/dL (ref 30.0–36.0)
MCV: 78.8 fL — ABNORMAL LOW (ref 80.0–100.0)
Platelets: 208 10*3/uL (ref 150–400)
RBC: 4.85 MIL/uL (ref 4.22–5.81)
RDW: 15.9 % — ABNORMAL HIGH (ref 11.5–15.5)
WBC: 3.8 10*3/uL — ABNORMAL LOW (ref 4.0–10.5)
nRBC: 0 % (ref 0.0–0.2)

## 2022-10-07 LAB — COMPREHENSIVE METABOLIC PANEL
ALT: 31 U/L (ref 0–44)
AST: 25 U/L (ref 15–41)
Albumin: 2.9 g/dL — ABNORMAL LOW (ref 3.5–5.0)
Alkaline Phosphatase: 58 U/L (ref 38–126)
Anion gap: 9 (ref 5–15)
BUN: 13 mg/dL (ref 8–23)
CO2: 20 mmol/L — ABNORMAL LOW (ref 22–32)
Calcium: 8.8 mg/dL — ABNORMAL LOW (ref 8.9–10.3)
Chloride: 105 mmol/L (ref 98–111)
Creatinine, Ser: 0.74 mg/dL (ref 0.61–1.24)
GFR, Estimated: >60 mL/min (ref >60)
Glucose, Bld: 107 mg/dL — ABNORMAL HIGH (ref 70–99)
Potassium: 3.8 mmol/L (ref 3.5–5.1)
Sodium: 134 mmol/L — ABNORMAL LOW (ref 135–145)
Total Bilirubin: 0.3 mg/dL (ref 0.3–1.2)
Total Protein: 6.6 g/dL (ref 6.5–8.1)

## 2022-10-07 LAB — PHOSPHORUS: Phosphorus: 2.2 mg/dL — ABNORMAL LOW (ref 2.5–4.6)

## 2022-10-07 LAB — MAGNESIUM: Magnesium: 1.7 mg/dL (ref 1.7–2.4)

## 2022-10-07 NOTE — Progress Notes (Addendum)
PROGRESS NOTE    [name]  EVO:[id number] DOB: June 08, 1961 DOA: 09/05/2022 PCP: [facility]   Brief Narrative:    [name] is a 62 yo male with PMH dementia, bipolar 1 disorder, HLD, HTN, CAD, CVA who presented with confusion and dysphagia. With further workup he was also found to be agitated/impulsive, and unable to be redirected.  He required Haldol and Ativan.  His significant other was concerned about his inability to swallow and he was brought for further workup. Because of the difficulty eating/dysphagia, he was initially initiated on a stroke workup.  However, further workup also was notable for an elevated lithium level.  Given his presenting symptoms, it was felt that they were attributed to lithium toxicity.  Poison control was called and patient was started on fluids and admitted. Despite aggressive measures, he showed little improvement during hospitalization.  Given his poor physical reserve and declining functional status leading up to hospitalization, he appeared to be more consistent with failure to thrive.  Goals of care discussions were held with his significant other who is his main caretaker and decision maker.  He was made DNR and continued on medical treatment in the hopes for some improvement however his prognosis was made clear that it appears to be worsening daily.  Palliative continues to have ongoing discussions with the girlfriend and ethics is involved.    09/18/22 -Had face-to-face Conference with patient's girlfriend on 09/13/2022 -Patient girlfriend requested that I do Not call her to talk to her about patient's poor prognosis anymore, because it makes her sad.- -Patient's girlfriend is accusing the medical team of "prematurely giving up on patient" --She tells me that she is not ready to make any decisions about possible de-escalation of care until the middle of January at the earliest -oral intake remains very  poor -Hypoglycemia with blood sugar of 69 noted despite continuous IV dextrose drip-- -will increase IV dextrose rate -Overall prognosis remains poor given very poor oral intake due to persistent metabolic encephalopathy superimposed on advanced dementia   09/19/22 -Remain calm comfortable, remained calm, comfortable, sleepy -Refused to eat or to be fed   09/20/2022 -Agitated confused overnight, Ativan and Haldol was used overnight -Appreciate palliative care following, and consulted ethics committee Current recommendation is neuroconsultation, attempt of Dobbhoff placement and initiation of tube feeding Fails we will proceed with comfort care     09/21/2022 -Some, minimal interaction between him and the nursing staff noted this morning with some oral intake -Plan remains for Dobbhoff placement per ethics committee recommendations Nutrition to be consulted for tube feeds -Psych has been consulted for evaluation -Pending neuro eval   09/22/2022 -Patient received Dobbhoff feeding tube placement by IR on 09/21/2022 -Nutrition has started tube feeding -Neurology has evaluated the patient-looking for reversible causes -Agitated overnight received Ativan around 3 AM Unresponsive non-interactive this a.m.   09/23/2022 -Patient was seen and examined -Agitated earlier per nursing staff -Dobbhoff tube still in place, tube feeds,     09/24/2022 -A bit more responsive to the nursing staff at bedside was encouraging oral intake -Drop-offs still present -tolerating tube feeds     09/25/2022 -Opens his eyes but does not follow command, nonverbal, noncommunicating -Per nursing staff agitated earlier this morning, has had p.o. intake for first time today -Dobbhoff feeding tube still in place   09/26/22 -Patient is nonverbal and appears to have pulled out feeding tube on 1/9 -Appears to tolerating some oral intake -Trial of lactulose today due  to elevated ammonia levels -Depakote recently increased  by behavioral health -Trial of Megace to see if this helps to improve appetite -Continue folate repletion -Appears to be having more of a failure to thrive picture and is appropriate for hospice  09/27/22 -Calorie count initiated to help see if pt meeting caloric needs -As per Neurology note, patient is an appropriate candidate for hospice. Since next of kin cannot be contacted at this point, attempts will be made to transfer to facility for hospice care.   09/28/22-09/29/22 -DSS evaluating for emergency guardianship at which point patient can be discharged once a facility has been obtained.  09/30/22-10/01/22 Patient has obtained emergency guardianship and is now awaiting placement.  Still requires sitter at bedside, plan to start Seroquel to assist with behavioral disturbances.  10/02/22 Plan to increase Seroquel dosing to 50 mg twice daily due to ongoing agitation and ongoing need for sitter.  10/03/22 Increase Seroquel to 100 mg twice daily with ongoing agitation noted overnight.  10/04/22 Increase Seroquel to 150 mg twice daily and continue sitter.  10/05/22 Increase Seroquel to 200 mg twice daily and continue sitter.  TTS evaluation to see if there could be some assistance in helping manage his behavior.  Trying to avoid the need for sitter so placement can be pursued.  10/06/22: The patient was seen and examined at his bedside.  Does not answer questions.  Per sitter in the room he was communicating with her earlier.  Ate without any difficulties.  10/07/2022: More agitated today.  Requiring additional dose of IV Ativan.  Assessment & Plan:   Principal Problem:   Acute metabolic encephalopathy Active Problems:   Bipolar 1 disorder (HCC)   Dysphagia   Lithium toxicity   Goals of care, counseling/discussion   Severe/Advanced Dementia with behavioral disturbance (HCC)   FTT (failure to thrive) in adult   Hypernatremia   Hyperlipidemia   Prolonged QT interval   Protein-calorie  malnutrition, severe  Assessment and Plan:   1)Lithium Toxicity Lithium discontinued. Psychiatry following.  Lithium level 1.51 on admission -- lithium level normalized and dropped -No change in mentation,-not interactive -Status post psych/TTS evaluation - appreciate tele-psych rec's; he has been taken off lithium and started on depakote; continue p.o. for now and if necessary,  transition to IV   - [name]: Cachectic, minimally interactive, poor p.o. 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  Verner Chol -recommended trial of Dobbhoff feeding tube placement and tube feeding -      -No improvement-minimally interactive, impulsive,-continue to pull out IVs telemetry lines -As needed Ativan, Haldol -No PO nutritional intake -IV fluid limited as he is pulling the line out   -Palliative care team, and ethics committee consulted: Recommended: Attempt Dobbhoff tube and tube feeds if fails progress to comfort care Recommended Neuro consultation   - patient has been hospitalized for days at that point and he has shown no signs of improvement.  He remains minimally interactive and impulsive, constantly trying to climb out of bed.  He has pulled IV out as well as telemetry off repetitively.   -He also has had extremely minimal nutritional intake since admission.    -WE do not find an to be a good candidate for G- tube placement as he will inevitably pull this out as well and PEG tube placement also considered medically futile and inappropriate in context of his multiple comorbidities, poor quality of life, and progressive functional decline   - Discussed with Barbaraann Share that he does not appear to have the physical reserve to overcome the events that have led up to this hospitalization.  He may also not survive this hospitalization.       - [name] agrees with DNR status at this time. She does wish for ongoing attempts/treatment to see if he improves, but should he continue to decline (which I expect), then a natural death will be allowed. He is currently not a rehab candidate unless were to show tremendous improvement and rather he appears more appropriate for inpatient vs residential hospice depending on course of events over the next few days -Overall prognosis remains poor/grave -Family contact--   Richardean Chimera [phone] -As per [name] .... No living family members.  -Palliative working on contacting Ms. Scales       4)Hypernatremia -resolved   5)Acute metabolic encephalopathy  in setting of baseline advanced/severe dementia with significant cognitive and memory deficits at baseline - Etiology attributed to probable lithium toxicity - CT head unremarkable for acute findings.  Chronic left frontal lobe infarct appreciated -MRI brain also negative for acute stroke -Patient is reported to have some altered mentation at baseline as well; per girlfriend,  he is mostly nonverbal at baseline----please see #2 above -Continue sitter -Continue assist of nursing staff -Trial of lactulose for elevated ammonia level -Continue folate repletion -Depakote as adjusted per behavioral health   6)Dysphagia -Multifactorial including lithium toxicity, psychiatric disorder refusing oral intake -First oral intake 09/25/2022   -Per ethic committee trial of Dobbhoff tube place and enteric feeding if it fails we will proceed with comfort care   - Recent EGD June 2023, cannot see results, but per GI had gastritis and Candida esophagitis - unfortunately no signs of improvement still - see GOC - evaluated by SLP - continue offering diet as able; poor candidate for NGT and PEG candidate; not appropriate for TPN (No clear goals for long-term solution) -Pulled out Dobbhoff tube     Leukocytosis -likely reactive-resolved -Chest x-ray on 09/17/2022 without acute findings -Get blood cultures, check UA -Hold off on antibiotics   Resolved prolonged QT interval - Initial QTc was prolonged Repeated 12 EKG with  QTc 429 on 10/07/2022 Avoid QTc prolonging agents   Hyperlipidemia -discontinued statin, risk outweighs the benefit for this patient   Bipolar 1 disorder (HCC) - Discontinued lithium, per psych recommendation on Cogentin, IV Depakote - haldol / ativan PRN   Contamination of blood culture-resolved as of 09/09/2022 - 1/4 bottles noted with GPC from 12/20 but apparently nothing seen on media per micro. Has remained afebrile with minimal leukocytosis (considered reactive from other  processes) - Will Repeat blood culture if becomes febrile      DVT prophylaxis:Lovenox subcu daily. Code Status: Full Family Communication: Tried calling significant other with no response on multiple days Disposition Plan:  Status is: Inpatient Remains inpatient appropriate because: Need for ongoing close monitoring and IV medications.   Consultants:  Palliative Neurology Behavioral health Ethics TTS 1/19  Procedures:  None  Antimicrobials:  Anti-infectives (From admission, onward)    Start     Dose/Rate Route Frequency Ordered Stop   09/06/22 0900  voriconazole (VFEND) tablet 200 mg  Status:  Discontinued        200 mg Oral  Once 09/05/22 1941 09/07/22 0815       Objective: Vitals:   10/06/22 0419 10/06/22 2140 10/07/22 0439 10/07/22 0528  BP:  119/76  (!) 156/95  Pulse:  71  82  Resp:  18  18  Temp:  98.4 F (36.9 C)  98 F (36.7 C)  TempSrc:  Oral  Axillary  SpO2:  100%  98%  Weight: 49.6 kg  55.8 kg   Height:        Intake/Output Summary (Last 24 hours) at 10/07/2022 1423 Last data filed at 10/07/2022 1411 Gross per 24 hour  Intake 6016.15 ml  Output 2400 ml  Net 3616.15 ml   Filed Weights   10/05/22 0611 10/06/22 0419 10/07/22 0439  Weight: 48.7 kg 49.6 kg 55.8 kg    Examination:  General exam: Frail appearing no acute distress.   Respiratory system: Clear to auscultation no wheezes or rales. Cardiovascular system: S1 & S2 heard, regular rate and rhythm Gastrointestinal system: Abdomen is soft  Central nervous system: Alert and minimally interactive. Refuses to participate Extremities: No edema Skin: No significant lesions noted    Data Reviewed: I have personally reviewed following labs and imaging studies  CBC: Recent Labs  Lab 10/03/22 0410 10/06/22 0536 10/07/22 0558  WBC 3.1* 3.8* 3.8*  HGB 12.5* 12.2* 11.8*  HCT 40.9 38.9* 38.2*  MCV 78.7* 77.5* 78.8*  PLT 214 206 208   Basic Metabolic Panel: Recent Labs  Lab  10/03/22 0410 10/06/22 0536 10/07/22 0558  NA 139 136 134*  K 4.6 4.3 3.8  CL 108 110 105  CO2 24 24 20*  GLUCOSE 92 97 107*  BUN 10 13 13   CREATININE 0.67 0.67 0.74  CALCIUM 9.0 8.7* 8.8*  MG 1.8 1.8 1.7  PHOS  --   --  2.2*   GFR: Estimated Creatinine Clearance: 75.6 mL/min (by C-G formula based on SCr of 0.74 mg/dL). Liver Function Tests: Recent Labs  Lab 10/03/22 0410 10/07/22 0558  AST 34 25  ALT 47* 31  ALKPHOS 62 58  BILITOT 0.3 0.3  PROT 6.7 6.6  ALBUMIN 2.9* 2.9*   No results for input(s): "LIPASE", "AMYLASE" in the last 168 hours. No results for input(s): "AMMONIA" in the last 168 hours.  Coagulation Profile: No results for input(s): "INR", "PROTIME" in the last 168 hours. Cardiac Enzymes: No results for input(s): "CKTOTAL", "CKMB", "CKMBINDEX", "TROPONINI" in the  last 168 hours. BNP (last 3 results) No results for input(s): "PROBNP" in the last 8760 hours. HbA1C: No results for input(s): "HGBA1C" in the last 72 hours. CBG: Recent Labs  Lab 10/04/22 0020  GLUCAP 69*    Lipid Profile: No results for input(s): "CHOL", "HDL", "LDLCALC", "TRIG", "CHOLHDL", "LDLDIRECT" in the last 72 hours. Thyroid Function Tests: No results for input(s): "TSH", "T4TOTAL", "FREET4", "T3FREE", "THYROIDAB" in the last 72 hours. Anemia Panel: No results for input(s): "VITAMINB12", "FOLATE", "FERRITIN", "TIBC", "IRON", "RETICCTPCT" in the last 72 hours. Sepsis Labs: No results for input(s): "PROCALCITON", "LATICACIDVEN" in the last 168 hours.  No results found for this or any previous visit (from the past 240 hour(s)).       Radiology Studies: No results found.      Scheduled Meds:  divalproex  250 mg Oral Q8H   enoxaparin (LOVENOX) injection  40 mg Subcutaneous Q46N   folic acid  1 mg Oral Daily   megestrol  200 mg Oral BID   nystatin   Topical BID   mouth rinse  15 mL Mouth Rinse 4 times per day   pantoprazole  40 mg Oral Daily   QUEtiapine  200 mg Oral  BID   Continuous Infusions:  dextrose 5 % and 0.9% NaCl 75 mL/hr at 10/07/22 0318     LOS: 31 days    Time spent: 35 minutes    Kayleen Memos, DO [facility]  If 7PM-7AM, please contact night-coverage [email or web address] 10/07/2022, 2:23 PM

## 2022-10-07 NOTE — Progress Notes (Signed)
Noted yesterdays STAT EKG not completed.  Done at this time with 3 assist, got best rhythm possible as pt is restless and unable to lay still

## 2022-10-07 NOTE — Progress Notes (Signed)
Safety sitter at bedside, pt required frequent redirecting until about halfway through shift, has since been able to rest.  Pt noted to have increased verbal communication, and has appropriate yes/no responses.  Pt states, "no" when asked of pain, no pain assessed.  Able to reposition self in bed.  No prn meds needed at this time for comfort.  Tolerating food and fluids.  Malew[place] in place.

## 2022-10-08 DIAGNOSIS — F03918 Unspecified dementia, unspecified severity, with other behavioral disturbance: Secondary | ICD-10-CM

## 2022-10-08 DIAGNOSIS — E43 Unspecified severe protein-calorie malnutrition: Secondary | ICD-10-CM | POA: Diagnosis not present

## 2022-10-08 DIAGNOSIS — G9341 Metabolic encephalopathy: Secondary | ICD-10-CM | POA: Diagnosis not present

## 2022-10-08 DIAGNOSIS — Z7189 Other specified counseling: Secondary | ICD-10-CM | POA: Diagnosis not present

## 2022-10-08 DIAGNOSIS — R627 Adult failure to thrive: Secondary | ICD-10-CM | POA: Diagnosis not present

## 2022-10-08 LAB — BASIC METABOLIC PANEL
Anion gap: 7 (ref 5–15)
BUN: 16 mg/dL (ref 8–23)
CO2: 22 mmol/L (ref 22–32)
Calcium: 8.6 mg/dL — ABNORMAL LOW (ref 8.9–10.3)
Chloride: 104 mmol/L (ref 98–111)
Creatinine, Ser: 0.79 mg/dL (ref 0.61–1.24)
GFR, Estimated: >60 mL/min (ref >60)
Glucose, Bld: 96 mg/dL (ref 70–99)
Potassium: 4 mmol/L (ref 3.5–5.1)
Sodium: 133 mmol/L — ABNORMAL LOW (ref 135–145)

## 2022-10-08 LAB — CBC
HCT: 36 % — ABNORMAL LOW (ref 39.0–52.0)
Hemoglobin: 11.1 g/dL — ABNORMAL LOW (ref 13.0–17.0)
MCH: 24.3 pg — ABNORMAL LOW (ref 26.0–34.0)
MCHC: 30.8 g/dL (ref 30.0–36.0)
MCV: 78.9 fL — ABNORMAL LOW (ref 80.0–100.0)
Platelets: 219 10*3/uL (ref 150–400)
RBC: 4.56 MIL/uL (ref 4.22–5.81)
RDW: 15.9 % — ABNORMAL HIGH (ref 11.5–15.5)
WBC: 4.8 10*3/uL (ref 4.0–10.5)
nRBC: 0 % (ref 0.0–0.2)

## 2022-10-08 MED ORDER — SODIUM PHOSPHATES 45 MMOLE/15ML IV SOLN
30.0000 mmol | Freq: Once | INTRAVENOUS | Status: AC
  Administered 2022-10-08: 30 mmol via INTRAVENOUS
  Filled 2022-10-08: qty 10

## 2022-10-08 MED ORDER — HALOPERIDOL LACTATE 5 MG/ML IJ SOLN
INTRAMUSCULAR | Status: AC
  Filled 2022-10-08: qty 1

## 2022-10-08 MED ORDER — DIPHENHYDRAMINE HCL 50 MG/ML IJ SOLN
INTRAMUSCULAR | Status: AC
  Filled 2022-10-08: qty 1

## 2022-10-08 MED ORDER — HALOPERIDOL LACTATE 5 MG/ML IJ SOLN
5.0000 mg | Freq: Four times a day (QID) | INTRAMUSCULAR | Status: DC | PRN
  Administered 2022-10-08 (×2): 5 mg via INTRAVENOUS
  Filled 2022-10-08: qty 1

## 2022-10-08 MED ORDER — BISACODYL 10 MG RE SUPP
10.0000 mg | Freq: Once | RECTAL | Status: AC
  Administered 2022-10-08: 10 mg via RECTAL
  Filled 2022-10-08: qty 1

## 2022-10-08 MED ORDER — POLYETHYLENE GLYCOL 3350 17 G PO PACK
17.0000 g | PACK | Freq: Two times a day (BID) | ORAL | Status: DC
  Administered 2022-10-08 – 2022-10-20 (×23): 17 g via ORAL
  Filled 2022-10-08 (×27): qty 1

## 2022-10-08 MED ORDER — DIPHENHYDRAMINE HCL 50 MG/ML IJ SOLN
50.0000 mg | Freq: Once | INTRAMUSCULAR | Status: AC
  Administered 2022-10-08: 50 mg via INTRAVENOUS

## 2022-10-08 NOTE — Progress Notes (Signed)
Left message for pt guardian Chance Cecille Rubin regarding events of this morning as well as need for soft wrist restraints.  Left call back number if there are any questions.

## 2022-10-08 NOTE — Progress Notes (Signed)
Pt has remained agitated thought this shift. Safety sitter has been bed side and has had to constantly redirect pt. Pt has remained in restraints this shift. Pt has been able to take medications and has ate over half of his lunch and dinner. Pt remains combative at times and will try to kick at sitter and staff members. PT has been offered food and drink throughout shift and every two our restraint has been filled out.

## 2022-10-08 NOTE — Progress Notes (Signed)
PROGRESS NOTE    [name]  EVO:[id number] DOB: June 08, 1961 DOA: 09/05/2022 PCP: [facility]   Brief Narrative:    [name] is a 62 yo male with PMH dementia, bipolar 1 disorder, HLD, HTN, CAD, CVA who presented with confusion and dysphagia. With further workup he was also found to be agitated/impulsive, and unable to be redirected.  He required Haldol and Ativan.  His significant other was concerned about his inability to swallow and he was brought for further workup. Because of the difficulty eating/dysphagia, he was initially initiated on a stroke workup.  However, further workup also was notable for an elevated lithium level.  Given his presenting symptoms, it was felt that they were attributed to lithium toxicity.  Poison control was called and patient was started on fluids and admitted. Despite aggressive measures, he showed little improvement during hospitalization.  Given his poor physical reserve and declining functional status leading up to hospitalization, he appeared to be more consistent with failure to thrive.  Goals of care discussions were held with his significant other who is his main caretaker and decision maker.  He was made DNR and continued on medical treatment in the hopes for some improvement however his prognosis was made clear that it appears to be worsening daily.  Palliative continues to have ongoing discussions with the girlfriend and ethics is involved.    09/18/22 -Had face-to-face Conference with patient's girlfriend on 09/13/2022 -Patient girlfriend requested that I do Not call her to talk to her about patient's poor prognosis anymore, because it makes her sad.- -Patient's girlfriend is accusing the medical team of "prematurely giving up on patient" --She tells me that she is not ready to make any decisions about possible de-escalation of care until the middle of January at the earliest -oral intake remains very  poor -Hypoglycemia with blood sugar of 69 noted despite continuous IV dextrose drip-- -will increase IV dextrose rate -Overall prognosis remains poor given very poor oral intake due to persistent metabolic encephalopathy superimposed on advanced dementia   09/19/22 -Remain calm comfortable, remained calm, comfortable, sleepy -Refused to eat or to be fed   09/20/2022 -Agitated confused overnight, Ativan and Haldol was used overnight -Appreciate palliative care following, and consulted ethics committee Current recommendation is neuroconsultation, attempt of Dobbhoff placement and initiation of tube feeding Fails we will proceed with comfort care     09/21/2022 -Some, minimal interaction between him and the nursing staff noted this morning with some oral intake -Plan remains for Dobbhoff placement per ethics committee recommendations Nutrition to be consulted for tube feeds -Psych has been consulted for evaluation -Pending neuro eval   09/22/2022 -Patient received Dobbhoff feeding tube placement by IR on 09/21/2022 -Nutrition has started tube feeding -Neurology has evaluated the patient-looking for reversible causes -Agitated overnight received Ativan around 3 AM Unresponsive non-interactive this a.m.   09/23/2022 -Patient was seen and examined -Agitated earlier per nursing staff -Dobbhoff tube still in place, tube feeds,     09/24/2022 -A bit more responsive to the nursing staff at bedside was encouraging oral intake -Drop-offs still present -tolerating tube feeds     09/25/2022 -Opens his eyes but does not follow command, nonverbal, noncommunicating -Per nursing staff agitated earlier this morning, has had p.o. intake for first time today -Dobbhoff feeding tube still in place   09/26/22 -Patient is nonverbal and appears to have pulled out feeding tube on 1/9 -Appears to tolerating some oral intake -Trial of lactulose today due  to elevated ammonia levels -Depakote recently increased  by behavioral health -Trial of Megace to see if this helps to improve appetite -Continue folate repletion -Appears to be having more of a failure to thrive picture and is appropriate for hospice  09/27/22 -Calorie count initiated to help see if pt meeting caloric needs -As per Neurology note, patient is an appropriate candidate for hospice. Since next of kin cannot be contacted at this point, attempts will be made to transfer to facility for hospice care.   09/28/22-09/29/22 -DSS evaluating for emergency guardianship at which point patient can be discharged once a facility has been obtained.  09/30/22-10/01/22 Patient has obtained emergency guardianship and is now awaiting placement.  Still requires sitter at bedside, plan to start Seroquel to assist with behavioral disturbances.  10/02/22 Plan to increase Seroquel dosing to 50 mg twice daily due to ongoing agitation and ongoing need for sitter.  10/03/22 Increase Seroquel to 100 mg twice daily with ongoing agitation noted overnight.  10/04/22 Increase Seroquel to 150 mg twice daily and continue sitter.  10/05/22 Increase Seroquel to 200 mg twice daily and continue sitter.  TTS evaluation to see if there could be some assistance in helping manage his behavior.  Trying to avoid the need for sitter so placement can be pursued.  10/06/22: The patient was seen and examined at his bedside.  Does not answer questions.  Per sitter in the room he was communicating with her earlier.  Ate without any difficulties.  10/07/2022: More agitated today.  Requiring additional dose of IV Ativan.  10/08/2022: Restless.  Still needing one-to-one sitter.  Assessment & Plan:   Principal Problem:   Acute metabolic encephalopathy Active Problems:   Bipolar 1 disorder (HCC)   Dysphagia   Lithium toxicity   Goals of care, counseling/discussion   Severe/Advanced Dementia with behavioral disturbance (HCC)   FTT (failure to thrive) in adult   Hypernatremia    Hyperlipidemia   Prolonged QT interval   Protein-calorie malnutrition, severe  Assessment and Plan:   1)Lithium Toxicity Lithium discontinued. Psychiatry following.  Lithium level 1.51 on admission -- lithium level normalized and dropped -No change in mentation,-not interactive -Status post psych/TTS evaluation - appreciate tele-psych rec's; he has been taken off lithium and started on depakote; continue p.o. for now and if necessary,  transition to IV   - [name]: Cachectic, minimally interactive, poor p.o. intake - poor QOL with decline as evidenced by severe cachexia;  -  DNR -palliative following     2) Advanced dementia with significant cognitive and memory deficits-encephalopathic -progressive decline-in setting of dementia   -Neurology consulted: Reviewing and evaluating for reversible causes-unlikely change in mentation at this point -Psych/CTS evaluation, adjusting medication - unlikely to reverse his condition at this point   -patient significant other [name]  recalls that the patient has had significant memory and cognitive decline over the last several years -Overall 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 -The last time he was able to put a couple words together in a phrase was apparently around June 2023 when he said that the garage where he used to do his Dealer work was on [address] -otherwise over the last 12 months patient may answer with one-word answers, significant cognitive decline -Patient lost over 40  pounds of weight due to poor oral intake -Recount initiated on 1/11     3)Goals of care, counseling/discussion - Long conversation held with [place] on 09/10/2022 and again on 09/13/2022, September 19, 2022   -Ongoing discussion with palliative care team -Ethic  committee consulted, ongoing discussion For committee and Ms. Verner Chol -recommended trial of Dobbhoff feeding tube placement and tube feeding -      -No improvement-minimally interactive, impulsive,-continue to pull out IVs telemetry lines -As needed Ativan, Haldol -No PO nutritional intake -IV fluid limited as he is pulling the line out   -Palliative care team, and ethics committee consulted: Recommended: Attempt Dobbhoff tube and tube feeds if fails progress to comfort care Recommended Neuro consultation   - patient has been hospitalized for days at that point and he has shown no signs of improvement.  He remains minimally interactive and impulsive, constantly trying to climb out of bed.  He has pulled IV out as well as telemetry off repetitively.   -He also has had extremely minimal nutritional intake since admission.    -WE do not find an to be a good candidate for G- tube placement as he will inevitably pull this out as well and PEG tube placement also considered medically futile and inappropriate in context of his multiple comorbidities, poor quality of life, and progressive functional decline   - Discussed with Barbaraann Share that he does not appear to have the physical reserve to overcome the events that have led up to this hospitalization.  He may also not survive this hospitalization.       - [name] agrees with DNR status at this time. She does wish for ongoing attempts/treatment to see if he improves, but should he continue to decline (which I expect), then a natural death will be allowed. He is currently not a rehab candidate unless were to show tremendous improvement and rather he appears more appropriate for inpatient vs residential hospice depending on course of events over the next few days -Overall prognosis remains poor/grave -Family contact--   Richardean Chimera [phone] -As per [name] .... No living family members.  -Palliative working on contacting Ms. Scales        4)Hypernatremia -resolved   5)Acute metabolic encephalopathy in setting of baseline advanced/severe dementia with significant cognitive and memory deficits at baseline - Etiology attributed to probable lithium toxicity - CT head unremarkable for acute findings.  Chronic left frontal lobe infarct appreciated -MRI brain also negative for acute stroke -Patient is reported to have some altered mentation at baseline as well; per girlfriend,  he is mostly nonverbal at baseline----please see #2 above -Continue sitter -Continue assist of nursing staff -Trial of lactulose for elevated ammonia level -Continue folate repletion -Depakote as adjusted per behavioral health   6)Dysphagia -Multifactorial including lithium toxicity, psychiatric disorder refusing oral intake -First oral intake 09/25/2022   -Per ethic committee trial of Dobbhoff tube place and enteric feeding if it fails we will proceed with comfort care   - Recent EGD June 2023, cannot see results, but per GI had gastritis and Candida esophagitis - unfortunately no signs of improvement still - see GOC - evaluated by SLP - continue offering diet as able; poor candidate for NGT and PEG candidate; not appropriate for TPN (No clear goals for long-term solution) -Pulled out Dobbhoff tube     Leukocytosis -likely reactive-resolved -Chest x-ray on 09/17/2022 without acute findings -Get blood cultures, check UA -Hold off on antibiotics   Resolved prolonged QT interval -  Initial QTc was prolonged Repeated 12 EKG with QTc 429 on 10/07/2022 Avoid QTc prolonging agents   Hyperlipidemia -discontinued statin, risk outweighs the benefit for this patient   Bipolar 1 disorder ([place]) - Discontinued lithium, per psych recommendation on Cogentin, IV Depakote - haldol / ativan PRN   Contamination of blood culture-resolved as of 09/09/2022 - 1/4 bottles noted with GPC from 12/20 but apparently nothing seen on media per micro. Has remained afebrile  with minimal leukocytosis (considered reactive from other processes) - Will Repeat blood culture if becomes febrile      DVT prophylaxis:Lovenox subcu daily. Code Status: Full Family Communication: Tried calling significant other with no response on multiple days Disposition Plan:  Status is: Inpatient Remains inpatient appropriate because: Need for ongoing close monitoring and IV medications.   Consultants:  Palliative Neurology Behavioral health Ethics TTS 1/19  Procedures:  None  Antimicrobials:  Anti-infectives (From admission, onward)    Start     Dose/Rate Route Frequency Ordered Stop   09/06/22 0900  voriconazole (VFEND) tablet 200 mg  Status:  Discontinued        200 mg Oral  Once 09/05/22 1941 09/07/22 0815       Objective: Vitals:   10/07/22 1707 10/07/22 2315 10/08/22 0427 10/08/22 0456  BP: 108/71 (!) 146/97  (!) 132/92  Pulse: 97 60  84  Resp: 20 20  18   Temp:  97.6 F (36.4 C)  97.8 F (36.6 C)  TempSrc:  Oral    SpO2: 100% 97%  92%  Weight:   53.8 kg   Height:        Intake/Output Summary (Last 24 hours) at 10/08/2022 1631 Last data filed at 10/08/2022 1400 Gross per 24 hour  Intake 3233.61 ml  Output 900 ml  Net 2333.61 ml   Filed Weights   10/06/22 0419 10/07/22 0439 10/08/22 0427  Weight: 49.6 kg 55.8 kg 53.8 kg    Examination: No significant change from prior exam.  General exam: Frail appearing no acute distress.   Respiratory system: Clear to auscultation no wheezes or rales. Cardiovascular system: S1 & S2 heard, regular rate and rhythm Gastrointestinal system: Abdomen is soft  Central nervous system: Alert and minimally interactive. Refuses to participate Extremities: No edema Skin: No significant lesions noted    Data Reviewed: I have personally reviewed following labs and imaging studies  CBC: Recent Labs  Lab 10/03/22 0410 10/06/22 0536 10/07/22 0558 10/08/22 0856  WBC 3.1* 3.8* 3.8* 4.8  HGB 12.5* 12.2* 11.8*  11.1*  HCT 40.9 38.9* 38.2* 36.0*  MCV 78.7* 77.5* 78.8* 78.9*  PLT 214 206 208 073   Basic Metabolic Panel: Recent Labs  Lab 10/03/22 0410 10/06/22 0536 10/07/22 0558 10/08/22 0856  NA 139 136 134* 133*  K 4.6 4.3 3.8 4.0  CL 108 110 105 104  CO2 24 24 20* 22  GLUCOSE 92 97 107* 96  BUN 10 13 13 16   CREATININE 0.67 0.67 0.74 0.79  CALCIUM 9.0 8.7* 8.8* 8.6*  MG 1.8 1.8 1.7  --   PHOS  --   --  2.2*  --    GFR: Estimated Creatinine Clearance: 72.9 mL/min (by C-G formula based on SCr of 0.79 mg/dL). Liver Function Tests: Recent Labs  Lab 10/03/22 0410 10/07/22 0558  AST 34 25  ALT 47* 31  ALKPHOS 62 58  BILITOT 0.3 0.3  PROT 6.7 6.6  ALBUMIN 2.9* 2.9*   No results for input(s): "LIPASE", "AMYLASE" in the last 168  hours. No results for input(s): "AMMONIA" in the last 168 hours.  Coagulation Profile: No results for input(s): "INR", "PROTIME" in the last 168 hours. Cardiac Enzymes: No results for input(s): "CKTOTAL", "CKMB", "CKMBINDEX", "TROPONINI" in the last 168 hours. BNP (last 3 results) No results for input(s): "PROBNP" in the last 8760 hours. HbA1C: No results for input(s): "HGBA1C" in the last 72 hours. CBG: Recent Labs  Lab 10/04/22 0020  GLUCAP 69*    Lipid Profile: No results for input(s): "CHOL", "HDL", "LDLCALC", "TRIG", "CHOLHDL", "LDLDIRECT" in the last 72 hours. Thyroid Function Tests: No results for input(s): "TSH", "T4TOTAL", "FREET4", "T3FREE", "THYROIDAB" in the last 72 hours. Anemia Panel: No results for input(s): "VITAMINB12", "FOLATE", "FERRITIN", "TIBC", "IRON", "RETICCTPCT" in the last 72 hours. Sepsis Labs: No results for input(s): "PROCALCITON", "LATICACIDVEN" in the last 168 hours.  No results found for this or any previous visit (from the past 240 hour(s)).       Radiology Studies: DG Abd 1 View  Result Date: 10/08/2022 CLINICAL DATA:  Constipation EXAM: ABDOMEN - 1 VIEW COMPARISON:  None Available. FINDINGS:  Nonobstructive bowel gas pattern. Moderate volume stool throughout the colon and within the rectal vault. No free intraperitoneal gas. No organomegaly. Bilateral inguinal hernia repair with mesh. No acute bone abnormality. IMPRESSION: 1. Moderate volume stool. Nonobstructive bowel gas pattern. Electronically Signed   By: Helyn Numbers M.D.   On: 10/08/2022 01:39        Scheduled Meds:  divalproex  250 mg Oral Q8H   enoxaparin (LOVENOX) injection  40 mg Subcutaneous Q24H   folic acid  1 mg Oral Daily   megestrol  200 mg Oral BID   nystatin   Topical BID   mouth rinse  15 mL Mouth Rinse 4 times per day   pantoprazole  40 mg Oral Daily   polyethylene glycol  17 g Oral BID   QUEtiapine  200 mg Oral BID   Continuous Infusions:  dextrose 5 % and 0.9% NaCl 75 mL/hr at 10/08/22 0329     LOS: 32 days    Time spent: 35 minutes    Darlin Drop, DO [facility]  If 7PM-7AM, please contact night-coverage [email or web address] 10/08/2022, 4:31 PM

## 2022-10-08 NOTE — Progress Notes (Signed)
Provider notified of no documented BM since 1/14, order received for ABD xray.

## 2022-10-08 NOTE — Progress Notes (Signed)
Palliative: Chart review completed.  Overnight events noted. [name] is lying in bed in a dark room with bedside nursing staff present attending to needs.  He appears acutely/chronically ill and very frail.  He is alert, will make but not keep eye contact.  He does not respond to my questions.  When asked simple questions he will nod yes and no.  He is able to take ice cream without overt signs and symptoms of aspiration.  Conference with attending, bedside nursing staff, transition of care team related to patient condition, needs, goals of care, disposition.  Plan: At this point continue to treat the treatable but no CPR or intubation.  DSS is working for long-term care placement with hospice care.  [name] now has a guardian through [place].  His DSS social worker is [place].  Chance tells me that [name] has INTERIM guardianship with the court date scheduled for February 7.  As DSS has interim guardianship,  they are unable to make life and death choices such as hospice care.   90 minutes Quinn Axe, NP Palliative medicine team Team phone [phone] Greater than 50% of this time was spent counseling and coordinating care related to the above assessment and plan.
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
## 2022-10-08 NOTE — Progress Notes (Signed)
Pt has slept/rested majority of shift.  Became restless this am.  Noted that IV has infiltrated.  IV removed, warm compress applied to rt forearm.  New IV access obtained.  Pt able to converse with this nurse at times.  States he was from [place], used to have a dog named Butch, then later was able to give his birthday day and month, did state 1970 for year.  Pt able to hold donut to eat and does verbalize that he is hungry.  Miralax initiated for constipation.  Sitter at bedside to redirect patient.  Mitts left off for part of shift, pt skin on hands dry and cracked.  Oral cares provided.

## 2022-10-09 DIAGNOSIS — E43 Unspecified severe protein-calorie malnutrition: Secondary | ICD-10-CM | POA: Diagnosis not present

## 2022-10-09 DIAGNOSIS — Z7189 Other specified counseling: Secondary | ICD-10-CM | POA: Diagnosis not present

## 2022-10-09 DIAGNOSIS — G9341 Metabolic encephalopathy: Secondary | ICD-10-CM | POA: Diagnosis not present

## 2022-10-09 DIAGNOSIS — R627 Adult failure to thrive: Secondary | ICD-10-CM | POA: Diagnosis not present

## 2022-10-09 MED ORDER — QUETIAPINE FUMARATE 100 MG PO TABS
400.0000 mg | ORAL_TABLET | Freq: Two times a day (BID) | ORAL | Status: DC
  Administered 2022-10-09 – 2022-10-27 (×37): 400 mg via ORAL
  Filled 2022-10-09 (×37): qty 4

## 2022-10-09 MED ORDER — DIPHENHYDRAMINE HCL 50 MG/ML IJ SOLN
25.0000 mg | Freq: Once | INTRAMUSCULAR | Status: AC
  Administered 2022-10-09: 25 mg via INTRAVENOUS
  Filled 2022-10-09: qty 1

## 2022-10-09 MED ORDER — HALOPERIDOL LACTATE 5 MG/ML IJ SOLN
2.0000 mg | Freq: Once | INTRAMUSCULAR | Status: AC
  Administered 2022-10-09: 2 mg via INTRAVENOUS

## 2022-10-09 MED ORDER — HALOPERIDOL LACTATE 5 MG/ML IJ SOLN
INTRAMUSCULAR | Status: AC
  Filled 2022-10-09: qty 1

## 2022-10-09 NOTE — Progress Notes (Signed)
Pt asleep and in soft wrist restraints at start of shift.  Pt able to wake up for hs meds, fluids and ice cream.  Suppository given as pt continued to have no BM.  Approx 2330, pt stating he had to use the bathroom.  Restraints removed, IVF paused, pt assisted to [facility].  Pt had large hard stool.  After cares provided, assisted pt back to bed and fluids given.  Pt fell asleep prior to leaving room, shortly before midnight.  Pt slept without restraints or mitts, with sitter at side, until 0500.  Pt woke up attempting to get OOB.  Walked with pt in hall.  Noted pt kept scratching at skin on legs, arms, head.  Gave patient lotion to apply.  Provider notified, orders received for benadryl IV x 1 dose.  Pt given ice cream and fluids.  Sat with pt, pt laid back down at 0545.  At this time, able to leave restraints and mitts off with sitter at side.  IVF left off at this time to prevent pulling at tubing until pt able to be more comfortable.  Provider notified that restraints off.  Will continue to monitor.

## 2022-10-09 NOTE — TOC Progression Note (Signed)
Transition of Care [facility]) - Progression Note    Patient Details  Name: [name] MRN: [id number] Date of Birth: April 11, 1961  [facility] CM/SW Contact  Shade Flood, LCSW Phone Number: 10/09/2022, 1:41 PM  Clinical Narrative:     TOC following. Palliative APNP informed TOC that pt's sig other and his legal guardian are in agreement with referral for residential hospice. TOC sent referral to [facility]. Will follow.  Expected Discharge Plan: [place] Barriers to Discharge: Continued Medical Work up  Expected Discharge Plan and Services In-house Referral: Clinical Social Work   Post Acute Care Choice: [place] Living arrangements for the past 2 months: [place] Determinants of Health (SDOH) Interventions SDOH Screenings   Tobacco Use: Medium Risk (09/06/2022)    Readmission Risk Interventions    09/07/2022   12:35 PM  Readmission Risk Prevention Plan  Medication Screening Complete  Transportation Screening Complete

## 2022-10-09 NOTE — Progress Notes (Signed)
Palliative: [name] is lying quietly in bed in a dark room.  He appears acutely/chronically ill and quite frail.  He is alert, briefly making but not keeping eye contact.  He does not respond verbally to questions.  When asked if he would like some ice cream he is able to nod his head affirmatively.  I do not believe that he can make his basic needs known.  Phone call from [name] significant other of 27+ years, [name].  She tells me that she has been sick lately and working a lot.  She shares that she is in contact with DSS social worker, [name].  We talk about [name]'s acute and chronic health concerns.  I shared that [name] is not eating and drinking enough to sustain himself.  During a previous conversation [name] shared that [name] had lost weight, that she would feed him, he would chew the food, but then spit it out.  We talked about the benefits of residential hospice.  [name] is tearful during this conversation but states agreement that things are changing for [name] after over a month in the hospital.  I [name] that we will reach out to DSS social worker for direction of care.  At this point [name] is agreeable to residential hospice care.  Call to interim guardian, [name].  [name] asks for return call in a few minutes.  A few minutes later [name] arrives to the bedside for a face-to-face meeting.  We talked about [name]'s acute and chronic health concerns.  We talk about the benefits of residential hospice care for comfort and dignity.  [name] shares that DSS can sign [name] into hospice care, but they cannot endorse starting comfort care while hospitalized.    Conference with attending, bedside nursing staff, transition of care team related to patient condition, needs, goals of care, disposition.  Plan: Requesting comfort and dignity, residential hospice care at [facility].     Prognosis:   Anticipate 10 days or less without  life-sustaining fluids and medications.        70 minutes, extended time Tyler Axe, NP Palliative medicine team Team phone [phone] Greater than 50% of this time was spent counseling and coordinating care related to the above assessment and plan.

## 2022-10-09 NOTE — Progress Notes (Signed)
PROGRESS NOTE    [name]  EVO:[id number] DOB: June 08, 1961 DOA: 09/05/2022 PCP: [facility]   Brief Narrative:    [name] is a 62 yo male with PMH dementia, bipolar 1 disorder, HLD, HTN, CAD, CVA who presented with confusion and dysphagia. With further workup he was also found to be agitated/impulsive, and unable to be redirected.  He required Haldol and Ativan.  His significant other was concerned about his inability to swallow and he was brought for further workup. Because of the difficulty eating/dysphagia, he was initially initiated on a stroke workup.  However, further workup also was notable for an elevated lithium level.  Given his presenting symptoms, it was felt that they were attributed to lithium toxicity.  Poison control was called and patient was started on fluids and admitted. Despite aggressive measures, he showed little improvement during hospitalization.  Given his poor physical reserve and declining functional status leading up to hospitalization, he appeared to be more consistent with failure to thrive.  Goals of care discussions were held with his significant other who is his main caretaker and decision maker.  He was made DNR and continued on medical treatment in the hopes for some improvement however his prognosis was made clear that it appears to be worsening daily.  Palliative continues to have ongoing discussions with the girlfriend and ethics is involved.    09/18/22 -Had face-to-face Conference with patient's girlfriend on 09/13/2022 -Patient girlfriend requested that I do Not call her to talk to her about patient's poor prognosis anymore, because it makes her sad.- -Patient's girlfriend is accusing the medical team of "prematurely giving up on patient" --She tells me that she is not ready to make any decisions about possible de-escalation of care until the middle of January at the earliest -oral intake remains very  poor -Hypoglycemia with blood sugar of 69 noted despite continuous IV dextrose drip-- -will increase IV dextrose rate -Overall prognosis remains poor given very poor oral intake due to persistent metabolic encephalopathy superimposed on advanced dementia   09/19/22 -Remain calm comfortable, remained calm, comfortable, sleepy -Refused to eat or to be fed   09/20/2022 -Agitated confused overnight, Ativan and Haldol was used overnight -Appreciate palliative care following, and consulted ethics committee Current recommendation is neuroconsultation, attempt of Dobbhoff placement and initiation of tube feeding Fails we will proceed with comfort care     09/21/2022 -Some, minimal interaction between him and the nursing staff noted this morning with some oral intake -Plan remains for Dobbhoff placement per ethics committee recommendations Nutrition to be consulted for tube feeds -Psych has been consulted for evaluation -Pending neuro eval   09/22/2022 -Patient received Dobbhoff feeding tube placement by IR on 09/21/2022 -Nutrition has started tube feeding -Neurology has evaluated the patient-looking for reversible causes -Agitated overnight received Ativan around 3 AM Unresponsive non-interactive this a.m.   09/23/2022 -Patient was seen and examined -Agitated earlier per nursing staff -Dobbhoff tube still in place, tube feeds,     09/24/2022 -A bit more responsive to the nursing staff at bedside was encouraging oral intake -Drop-offs still present -tolerating tube feeds     09/25/2022 -Opens his eyes but does not follow command, nonverbal, noncommunicating -Per nursing staff agitated earlier this morning, has had p.o. intake for first time today -Dobbhoff feeding tube still in place   09/26/22 -Patient is nonverbal and appears to have pulled out feeding tube on 1/9 -Appears to tolerating some oral intake -Trial of lactulose today due  to elevated ammonia levels -Depakote recently increased  by behavioral health -Trial of Megace to see if this helps to improve appetite -Continue folate repletion -Appears to be having more of a failure to thrive picture and is appropriate for hospice  09/27/22 -Calorie count initiated to help see if pt meeting caloric needs -As per Neurology note, patient is an appropriate candidate for hospice. Since next of kin cannot be contacted at this point, attempts will be made to transfer to facility for hospice care.   09/28/22-09/29/22 -DSS evaluating for emergency guardianship at which point patient can be discharged once a facility has been obtained.  09/30/22-10/01/22 Patient has obtained emergency guardianship and is now awaiting placement.  Still requires sitter at bedside, plan to start Seroquel to assist with behavioral disturbances.  10/02/22 Plan to increase Seroquel dosing to 50 mg twice daily due to ongoing agitation and ongoing need for sitter.  10/03/22 Increase Seroquel to 100 mg twice daily with ongoing agitation noted overnight.  10/04/22 Increase Seroquel to 150 mg twice daily and continue sitter.  10/05/22 Increase Seroquel to 200 mg twice daily and continue sitter.  TTS evaluation to see if there could be some assistance in helping manage his behavior.  Trying to avoid the need for sitter so placement can be pursued.  10/06/22: The patient was seen and examined at his bedside.  Does not answer questions.  Per sitter in the room he was communicating with her earlier.  Ate without any difficulties.  10/07/2022: More agitated today.  Requiring additional dose of IV Ativan.  10/08/2022: Restless.  Still needing one-to-one sitter.  10/09/2022: Patient still restless and needing one-to-one sitter.  Increase Seroquel to 400 mg twice daily.  Consider change to Risperdal if needed.  Assessment & Plan:   Principal Problem:   Acute metabolic encephalopathy Active Problems:   Bipolar 1 disorder (HCC)   Dysphagia   Lithium toxicity   Goals  of care, counseling/discussion   Severe/Advanced Dementia with behavioral disturbance (HCC)   FTT (failure to thrive) in adult   Hypernatremia   Hyperlipidemia   Prolonged QT interval   Protein-calorie malnutrition, severe  Assessment and Plan:   1)Lithium Toxicity Lithium discontinued. Psychiatry following.  Lithium level 1.51 on admission -- lithium level normalized and dropped -No change in mentation,-not interactive -Status post psych/TTS evaluation - appreciate tele-psych rec's; he has been taken off lithium and started on depakote; continue p.o. for now and if necessary,  transition to IV   - [name]: Cachectic, minimally interactive, poor p.o. intake - poor QOL with decline as evidenced by severe cachexia;  -  DNR -palliative following     2) Advanced dementia with significant cognitive and memory deficits-encephalopathic -progressive decline-in setting of dementia   -Neurology consulted: Reviewing and evaluating for reversible causes-unlikely change in mentation at this point -Psych/CTS evaluation, adjusting medication - unlikely to reverse his condition at this point   -patient significant other [name]  recalls that the patient has had significant memory and cognitive decline over the last several years -Overall 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 Dossie Arbour -recommended trial of Dobbhoff feeding tube placement and tube feeding -      -No improvement-minimally interactive, impulsive,-continue to pull out IVs telemetry lines -As needed Ativan, Haldol -No PO nutritional intake -IV fluid limited as he is pulling the line out   -Palliative care team, and ethics committee consulted: Recommended: Attempt Dobbhoff tube and tube feeds if fails progress to comfort care Recommended Neuro consultation   - patient has been hospitalized for days at that point and he has shown no signs of improvement.  He remains minimally interactive and impulsive, constantly trying to climb out of bed.  He has pulled IV out as well as telemetry off repetitively.   -He also has had extremely minimal nutritional intake since admission.    -WE do not find an to be a good candidate for G- tube placement as he will inevitably pull this out as well and PEG tube placement also considered medically futile and inappropriate in context of his multiple comorbidities, poor quality of life, and progressive functional decline   - Discussed with Sallye Ober that he does not appear to have the physical reserve to overcome the events that have led up to this hospitalization.  He may also not survive this hospitalization.       - [name] agrees with DNR status at this time. She does wish for ongoing attempts/treatment to see if he improves, but should he continue to decline (which I expect), then a natural death will be allowed. He is currently not a rehab candidate unless were to show tremendous improvement and rather he appears more appropriate for inpatient vs residential hospice depending on course of events over the next few days -Overall prognosis remains poor/grave -Family  contact--   Deneise Lever [phone] -As per [name] .... No living family members.  -Palliative working on contacting Ms. Scales       4)Hypernatremia -resolved   5)Acute metabolic encephalopathy in setting of baseline advanced/severe dementia with significant cognitive and memory deficits at baseline - Etiology attributed to probable lithium toxicity - CT head unremarkable for acute findings.  Chronic left frontal lobe infarct appreciated -MRI brain also negative for acute stroke -Patient is reported to have some altered mentation at baseline as well; per girlfriend,  he is mostly nonverbal at baseline----please see #2 above -Continue sitter -Continue assist of nursing staff -Trial of lactulose for elevated ammonia level -Continue folate repletion -Depakote as adjusted per behavioral health   6)Dysphagia -Multifactorial including lithium toxicity, psychiatric disorder refusing oral intake -First oral intake 09/25/2022   -Per ethic committee trial of Dobbhoff tube place and enteric feeding if it fails we will proceed with comfort care   - Recent EGD June 2023, cannot see results, but per GI had gastritis and Candida esophagitis - unfortunately no signs of improvement still - see GOC - evaluated by SLP - continue offering diet as able; poor candidate for NGT and PEG candidate; not appropriate for TPN (No clear goals for long-term solution) -Pulled out Dobbhoff tube     Leukocytosis -likely  reactive-resolved -Chest x-ray on 09/17/2022 without acute findings -Get blood cultures, check UA -Hold off on antibiotics   Resolved prolonged QT interval - Initial QTc was prolonged Repeated 12 EKG with QTc 429 on 10/07/2022 Avoid QTc prolonging agents   Hyperlipidemia -discontinued statin, risk outweighs the benefit for this patient   Bipolar 1 disorder ([place]) - Discontinued lithium, per psych recommendation on Cogentin, IV Depakote - haldol / ativan PRN   Contamination  of blood culture-resolved as of 09/09/2022 - 1/4 bottles noted with GPC from 12/20 but apparently nothing seen on media per micro. Has remained afebrile with minimal leukocytosis (considered reactive from other processes) - Will Repeat blood culture if becomes febrile      DVT prophylaxis:Lovenox subcu daily. Code Status: Full Family Communication: Tried calling significant other with no response on multiple days Disposition Plan:  Status is: Inpatient Remains inpatient appropriate because: Need for ongoing close monitoring and IV medications.   Consultants:  Palliative Neurology Behavioral health Ethics TTS 1/19  Procedures:  None  Antimicrobials:  Anti-infectives (From admission, onward)    Start     Dose/Rate Route Frequency Ordered Stop   09/06/22 0900  voriconazole (VFEND) tablet 200 mg  Status:  Discontinued        200 mg Oral  Once 09/05/22 1941 09/07/22 0815       Objective: Vitals:   10/08/22 0456 10/08/22 1815 10/08/22 2123 10/09/22 0500  BP: (!) 132/92 126/75 (!) 123/102   Pulse: 84 61 68   Resp: 18 18 16    Temp: 97.8 F (36.6 C)     TempSrc:      SpO2: 92% 97% 100%   Weight:    51 kg  Height:        Intake/Output Summary (Last 24 hours) at 10/09/2022 1355 Last data filed at 10/09/2022 1300 Gross per 24 hour  Intake 1952.38 ml  Output 1900 ml  Net 52.38 ml   Filed Weights   10/07/22 0439 10/08/22 0427 10/09/22 0500  Weight: 55.8 kg 53.8 kg 51 kg    Examination: No significant change from prior exam.  General exam: Frail appearing no acute distress.   Respiratory system: Clear to auscultation no wheezes or rales. Cardiovascular system: S1 & S2 heard, regular rate and rhythm Gastrointestinal system: Abdomen is soft  Central nervous system: Alert and minimally interactive. Refuses to participate Extremities: No edema Skin: No significant lesions noted    Data Reviewed: I have personally reviewed following labs and imaging  studies  CBC: Recent Labs  Lab 10/03/22 0410 10/06/22 0536 10/07/22 0558 10/08/22 0856  WBC 3.1* 3.8* 3.8* 4.8  HGB 12.5* 12.2* 11.8* 11.1*  HCT 40.9 38.9* 38.2* 36.0*  MCV 78.7* 77.5* 78.8* 78.9*  PLT 214 206 208 191   Basic Metabolic Panel: Recent Labs  Lab 10/03/22 0410 10/06/22 0536 10/07/22 0558 10/08/22 0856  NA 139 136 134* 133*  K 4.6 4.3 3.8 4.0  CL 108 110 105 104  CO2 24 24 20* 22  GLUCOSE 92 97 107* 96  BUN 10 13 13 16   CREATININE 0.67 0.67 0.74 0.79  CALCIUM 9.0 8.7* 8.8* 8.6*  MG 1.8 1.8 1.7  --   PHOS  --   --  2.2*  --    GFR: Estimated Creatinine Clearance: 69.1 mL/min (by C-G formula based on SCr of 0.79 mg/dL). Liver Function Tests: Recent Labs  Lab 10/03/22 0410 10/07/22 0558  AST 34 25  ALT 47* 31  ALKPHOS 62 58  BILITOT 0.3  0.3  PROT 6.7 6.6  ALBUMIN 2.9* 2.9*   No results for input(s): "LIPASE", "AMYLASE" in the last 168 hours. No results for input(s): "AMMONIA" in the last 168 hours.  Coagulation Profile: No results for input(s): "INR", "PROTIME" in the last 168 hours. Cardiac Enzymes: No results for input(s): "CKTOTAL", "CKMB", "CKMBINDEX", "TROPONINI" in the last 168 hours. BNP (last 3 results) No results for input(s): "PROBNP" in the last 8760 hours. HbA1C: No results for input(s): "HGBA1C" in the last 72 hours. CBG: Recent Labs  Lab 10/04/22 0020  GLUCAP 69*    Lipid Profile: No results for input(s): "CHOL", "HDL", "LDLCALC", "TRIG", "CHOLHDL", "LDLDIRECT" in the last 72 hours. Thyroid Function Tests: No results for input(s): "TSH", "T4TOTAL", "FREET4", "T3FREE", "THYROIDAB" in the last 72 hours. Anemia Panel: No results for input(s): "VITAMINB12", "FOLATE", "FERRITIN", "TIBC", "IRON", "RETICCTPCT" in the last 72 hours. Sepsis Labs: No results for input(s): "PROCALCITON", "LATICACIDVEN" in the last 168 hours.  No results found for this or any previous visit (from the past 240 hour(s)).       Radiology  Studies: DG Abd 1 View  Result Date: 10/08/2022 CLINICAL DATA:  Constipation EXAM: ABDOMEN - 1 VIEW COMPARISON:  None Available. FINDINGS: Nonobstructive bowel gas pattern. Moderate volume stool throughout the colon and within the rectal vault. No free intraperitoneal gas. No organomegaly. Bilateral inguinal hernia repair with mesh. No acute bone abnormality. IMPRESSION: 1. Moderate volume stool. Nonobstructive bowel gas pattern. Electronically Signed   By: Helyn Numbers M.D.   On: 10/08/2022 01:39        Scheduled Meds:  divalproex  250 mg Oral Q8H   enoxaparin (LOVENOX) injection  40 mg Subcutaneous Q24H   folic acid  1 mg Oral Daily   megestrol  200 mg Oral BID   nystatin   Topical BID   mouth rinse  15 mL Mouth Rinse 4 times per day   pantoprazole  40 mg Oral Daily   polyethylene glycol  17 g Oral BID   QUEtiapine  400 mg Oral BID   Continuous Infusions:  dextrose 5 % and 0.9% NaCl Stopped (10/08/22 2320)     LOS: 33 days    Time spent: 35 minutes    Yair Dusza Hoover Brunette, DO [facility]  If 7PM-7AM, please contact night-coverage [email or web address] 10/09/2022, 1:55 PM

## 2022-10-09 NOTE — Progress Notes (Signed)
Pt resting calm in bed at this time. Sitter at bedside states pt has been resting and not agitated. Soft wrist restraints removed at this time.

## 2022-10-10 DIAGNOSIS — E43 Unspecified severe protein-calorie malnutrition: Secondary | ICD-10-CM | POA: Diagnosis not present

## 2022-10-10 DIAGNOSIS — R627 Adult failure to thrive: Secondary | ICD-10-CM | POA: Diagnosis not present

## 2022-10-10 DIAGNOSIS — Z7189 Other specified counseling: Secondary | ICD-10-CM | POA: Diagnosis not present

## 2022-10-10 DIAGNOSIS — G9341 Metabolic encephalopathy: Secondary | ICD-10-CM | POA: Diagnosis not present

## 2022-10-10 MED ORDER — DIVALPROEX SODIUM 125 MG PO CSDR
500.0000 mg | DELAYED_RELEASE_CAPSULE | Freq: Every day | ORAL | Status: DC
  Administered 2022-10-10 – 2022-10-13 (×4): 500 mg via ORAL
  Filled 2022-10-10 (×4): qty 4

## 2022-10-10 MED ORDER — DIVALPROEX SODIUM 125 MG PO CSDR
250.0000 mg | DELAYED_RELEASE_CAPSULE | Freq: Two times a day (BID) | ORAL | Status: DC
  Administered 2022-10-11 – 2022-10-13 (×5): 250 mg via ORAL
  Filled 2022-10-10 (×5): qty 2

## 2022-10-10 NOTE — Progress Notes (Signed)
Pt has been alert to self and has been able to take most oral medication. Pt has ate at least 50 % of his meals throughout the day. Pt has only needed PRN medication once due to being agitated. Sitter has remained beside throughout shift.

## 2022-10-10 NOTE — Progress Notes (Signed)
Pt woke up alert, states, "where's my clothes?"  Pt states he's thirsty, drank 2 boosts and some apple juice, ate 2 cups jello and a cup applesauce.  Able to get pt situated back in bed after, covered pt up, pt states, "thank you."  Resting comfortably at this time.  Sitter at side.

## 2022-10-10 NOTE — Progress Notes (Signed)
Nutrition Follow up  DOCUMENTATION CODES:   Severe malnutrition in context of chronic illness  underweight  INTERVENTION:   Dysphagia 2 diet with nectar thick liquids  Nectar thick milk with meals  Assist with feeding all meals   NUTRITION DIAGNOSIS:   Severe Malnutrition related to chronic illness, acute illness (acute metabolic encephalopathy in setting of dementia, dysphagia) as evidenced by energy intake < or equal to 50% for > or equal to 5 days, severe fat depletion, severe muscle depletion, energy intake < 75% for > or equal to 1 month.  - ongoing   GOAL:  Patient will meet greater than or equal to 90% of their needs (if feasible given his confusion) - not met  MONITOR:  PO intake, Labs, TF tolerance, Weight trends  ASSESSMENT: Patient is a 62 yo male with hx of dementia, HTN, CVA who presented  with confusion and dysphagia. Acute metabolic encephalopathy.   Palliative following. Patient referred to Hospice at [facility].   Patient assisted with meals and sitter is bedside. Per recent nursing documentation completing 50-100% of meals.     Weights reviewed. Last weight 12/20-59.4 kg. Will request new weight for assessment. Current weight obtained 49.5 kg which reflects severe loss of 10 kg (17%) < 1 month.   Medications: reviewed.      Latest Ref Rng & Units 10/08/2022    8:56 AM 10/07/2022    5:58 AM 10/06/2022    5:36 AM  BMP  Glucose 70 - 99 mg/dL 96  107  97   BUN 8 - 23 mg/dL 16  13  13    Creatinine 0.61 - 1.24 mg/dL 0.79  0.74  0.67   Sodium 135 - 145 mmol/L 133  134  136   Potassium 3.5 - 5.1 mmol/L 4.0  3.8  4.3   Chloride 98 - 111 mmol/L 104  105  110   CO2 22 - 32 mmol/L 22  20  24    Calcium 8.9 - 10.3 mg/dL 8.6  8.8  8.7         Diet Order:   Diet Order             DIET DYS 2 Room service appropriate? Yes; Fluid consistency: Nectar Thick  Diet effective now                   EDUCATION NEEDS:  Not appropriate for education at this  time  Skin:  Skin Assessment: Reviewed RN Assessment  Last BM:  1/23 large  Height:   Ht Readings from Last 1 Encounters:  09/05/22 5\' 8"  (1.727 m)    Weight:   Wt Readings from Last 1 Encounters:  10/10/22 54.6 kg    Ideal Body Weight:   70 kg  BMI:  Body mass index is 18.3 kg/m.  Estimated Nutritional Needs:   Kcal:  1900-2100  Protein:  100-105 gr  Fluid:  1800 ml daily  Colman Cater MS,RD,CSG,LDN Contact: Shea Evans

## 2022-10-10 NOTE — Progress Notes (Signed)
PROGRESS NOTE    [name]  IPJ:[id number] DOB: 1961-02-25 DOA: 09/05/2022 PCP: [facility]   Brief Narrative:    [name] is a 62 yo male with PMH dementia, bipolar 1 disorder, HLD, HTN, CAD, CVA who presented with confusion and dysphagia. With further workup he was also found to be agitated/impulsive, and unable to be redirected.  He required Haldol and Ativan.  His significant other was concerned about his inability to swallow and he was brought for further workup. Because of the difficulty eating/dysphagia, he was initially initiated on a stroke workup.  However, further workup also was notable for an elevated lithium level.  Given his presenting symptoms, it was felt that they were attributed to lithium toxicity.  Poison control was called and patient was started on fluids and admitted. Despite aggressive measures, he showed little improvement during hospitalization.  Given his poor physical reserve and declining functional status leading up to hospitalization, he appeared to be more consistent with failure to thrive.  Goals of care discussions were held with his significant other who is his main caretaker and decision maker.  He was made DNR and continued on medical treatment in the hopes for some improvement however his prognosis was made clear that it appears to be worsening daily.  Palliative continues to have ongoing discussions with the girlfriend and ethics is involved.    09/18/22 -Had face-to-face Conference with patient's girlfriend on 09/13/2022 -Patient girlfriend requested that I do Not call her to talk to her about patient's poor prognosis anymore, because it makes her sad.- -Patient's girlfriend is accusing the medical team of "prematurely giving up on patient" --She tells me that she is not ready to make any decisions about possible de-escalation of care until the middle of January at the earliest -oral intake remains very  poor -Hypoglycemia with blood sugar of 69 noted despite continuous IV dextrose drip-- -will increase IV dextrose rate -Overall prognosis remains poor given very poor oral intake due to persistent metabolic encephalopathy superimposed on advanced dementia   09/19/22 -Remain calm comfortable, remained calm, comfortable, sleepy -Refused to eat or to be fed   09/20/2022 -Agitated confused overnight, Ativan and Haldol was used overnight -Appreciate palliative care following, and consulted ethics committee Current recommendation is neuroconsultation, attempt of Dobbhoff placement and initiation of tube feeding Fails we will proceed with comfort care     09/21/2022 -Some, minimal interaction between him and the nursing staff noted this morning with some oral intake -Plan remains for Dobbhoff placement per ethics committee recommendations Nutrition to be consulted for tube feeds -Psych has been consulted for evaluation -Pending neuro eval   09/22/2022 -Patient received Dobbhoff feeding tube placement by IR on 09/21/2022 -Nutrition has started tube feeding -Neurology has evaluated the patient-looking for reversible causes -Agitated overnight received Ativan around 3 AM Unresponsive non-interactive this a.m.   09/23/2022 -Patient was seen and examined -Agitated earlier per nursing staff -Dobbhoff tube still in place, tube feeds,     09/24/2022 -A bit more responsive to the nursing staff at bedside was encouraging oral intake -Drop-offs still present -tolerating tube feeds     09/25/2022 -Opens his eyes but does not follow command, nonverbal, noncommunicating -Per nursing staff agitated earlier this morning, has had p.o. intake for first time today -Dobbhoff feeding tube still in place   09/26/22 -Patient is nonverbal and appears to have pulled out feeding tube on 1/9 -Appears to tolerating some oral intake -Trial of lactulose today due  to elevated ammonia levels -Depakote recently increased  by behavioral health -Trial of Megace to see if this helps to improve appetite -Continue folate repletion -Appears to be having more of a failure to thrive picture and is appropriate for hospice  09/27/22 -Calorie count initiated to help see if pt meeting caloric needs -As per Neurology note, patient is an appropriate candidate for hospice. Since next of kin cannot be contacted at this point, attempts will be made to transfer to facility for hospice care.   09/28/22-09/29/22 -DSS evaluating for emergency guardianship at which point patient can be discharged once a facility has been obtained.  09/30/22-10/01/22 Patient has obtained emergency guardianship and is now awaiting placement.  Still requires sitter at bedside, plan to start Seroquel to assist with behavioral disturbances.  10/02/22 Plan to increase Seroquel dosing to 50 mg twice daily due to ongoing agitation and ongoing need for sitter.  10/03/22 Increase Seroquel to 100 mg twice daily with ongoing agitation noted overnight.  10/04/22 Increase Seroquel to 150 mg twice daily and continue sitter.  10/05/22 Increase Seroquel to 200 mg twice daily and continue sitter.  TTS evaluation to see if there could be some assistance in helping manage his behavior.  Trying to avoid the need for sitter so placement can be pursued.  10/06/22: The patient was seen and examined at his bedside.  Does not answer questions.  Per sitter in the room he was communicating with her earlier.  Ate without any difficulties.  10/07/2022: More agitated today.  Requiring additional dose of IV Ativan.  10/08/2022: Restless.  Still needing one-to-one sitter.  10/09/2022: Patient still restless and needing one-to-one sitter.  Increase Seroquel to 400 mg twice daily.  Consider change to Risperdal if needed.  10/10/22: Patient seen by hospice nurse and not noted to be a candidate for hospice facility.  Will continue to try other facilities.  Still requiring sitter at  bedside. Plan to increase Depakote dose at bedtime.  Assessment & Plan:   Principal Problem:   Acute metabolic encephalopathy Active Problems:   Bipolar 1 disorder (HCC)   Dysphagia   Lithium toxicity   Goals of care, counseling/discussion   Severe/Advanced Dementia with behavioral disturbance (HCC)   FTT (failure to thrive) in adult   Hypernatremia   Hyperlipidemia   Prolonged QT interval   Protein-calorie malnutrition, severe  Assessment and Plan:   1)Lithium Toxicity Lithium discontinued. Psychiatry following.  Lithium level 1.51 on admission -- lithium level normalized and dropped -No change in mentation,-not interactive -Status post psych/TTS evaluation - appreciate tele-psych rec's; he has been taken off lithium and started on depakote; continue p.o. for now and if necessary,  transition to IV   - [name]: Cachectic, minimally interactive, poor p.o. 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 -The last time he was able to put a couple words together in a phrase was apparently around June 2023 when he said that the garage where he used to do his Curator work was on [employer] -otherwise over the last 12  months patient may answer with one-word answers, significant cognitive decline -Patient lost over 40 pounds of weight due to poor oral intake -Recount initiated on 1/11     3)Goals of care, counseling/discussion - Long conversation held with [facility] on 09/10/2022 and again on 09/13/2022, September 19, 2022   -Ongoing discussion with palliative care team -Ethic committee consulted, ongoing discussion For committee and Ms. Dossie Arbour -recommended trial of Dobbhoff feeding tube placement and tube feeding -      -No improvement-minimally interactive, impulsive,-continue to pull out IVs telemetry lines -As needed Ativan, Haldol -No PO nutritional intake -IV fluid limited as he is pulling the line out   -Palliative care team, and ethics committee consulted: Recommended: Attempt Dobbhoff tube and tube feeds if fails progress to comfort care Recommended Neuro consultation   - patient has been hospitalized for days at that point and he has shown no signs of improvement.  He remains minimally interactive and impulsive, constantly trying to climb out of bed.  He has pulled IV out as well as telemetry off repetitively.   -He also has had extremely minimal nutritional intake since admission.    -WE do not find an to be a good candidate for G- tube placement as he will inevitably pull this out as well and PEG tube placement also considered medically futile and inappropriate in context of his multiple comorbidities, poor quality of life, and progressive functional decline   - Discussed with Sallye Ober that he does not appear to have the physical reserve to overcome the events that have led up to this hospitalization.  He may also not survive this hospitalization.       - [name] agrees with DNR status at this time. She does wish for ongoing attempts/treatment to see if he improves, but should he continue to decline (which I expect), then a natural death will be allowed. He is currently not a rehab candidate unless  were to show tremendous improvement and rather he appears more appropriate for inpatient vs residential hospice depending on course of events over the next few days -Overall prognosis remains poor/grave -Family contact--   Deneise Lever [phone] -As per [name] .... No living family members.  -Palliative working on contacting Ms. Scales       4)Hypernatremia -resolved   5)Acute metabolic encephalopathy in setting of baseline advanced/severe dementia with significant cognitive and memory deficits at baseline - Etiology attributed to probable lithium toxicity - CT head unremarkable for acute findings.  Chronic left frontal lobe infarct appreciated -MRI brain also negative for acute stroke -Patient is reported to have some altered mentation at baseline as well; per girlfriend,  he is mostly nonverbal at baseline----please see #2 above -Continue sitter -Continue assist of nursing staff -Trial of lactulose for elevated ammonia level -Continue folate repletion -Depakote as adjusted per behavioral health   6)Dysphagia -Multifactorial including lithium toxicity, psychiatric disorder refusing oral intake -First oral intake 09/25/2022   -Per ethic committee trial of Dobbhoff tube place and enteric feeding if it fails we will proceed with comfort care   - Recent EGD June 2023, cannot see results, but per GI had gastritis and Candida esophagitis - unfortunately no signs of improvement still - see GOC -  evaluated by SLP - continue offering diet as able; poor candidate for NGT and PEG candidate; not appropriate for TPN (No clear goals for long-term solution) -Pulled out Dobbhoff tube     Leukocytosis -likely reactive-resolved -Chest x-ray on 09/17/2022 without acute findings -Get blood cultures, check UA -Hold off on antibiotics   Resolved prolonged QT interval - Initial QTc was prolonged Repeated 12 EKG with QTc 429 on 10/07/2022 Avoid QTc prolonging agents   Hyperlipidemia  -discontinued statin, risk outweighs the benefit for this patient   Bipolar 1 disorder (HCC) - Discontinued lithium, per psych recommendation on Cogentin, IV Depakote - haldol / ativan PRN   Contamination of blood culture-resolved as of 09/09/2022 - 1/4 bottles noted with GPC from 12/20 but apparently nothing seen on media per micro. Has remained afebrile with minimal leukocytosis (considered reactive from other processes) - Will Repeat blood culture if becomes febrile      DVT prophylaxis:Lovenox subcu daily. Code Status: Full Family Communication: Tried calling significant other with no response on multiple days Disposition Plan:  Status is: Inpatient Remains inpatient appropriate because: Need for ongoing close monitoring and IV medications.   Consultants:  Palliative Neurology Behavioral health Ethics TTS 1/19  Procedures:  None  Antimicrobials:  Anti-infectives (From admission, onward)    Start     Dose/Rate Route Frequency Ordered Stop   09/06/22 0900  voriconazole (VFEND) tablet 200 mg  Status:  Discontinued        200 mg Oral  Once 09/05/22 1941 09/07/22 0815       Objective: Vitals:   10/09/22 1849 10/09/22 2145 10/10/22 0500 10/10/22 0658  BP: 116/74 121/85  100/66  Pulse: 71 71  99  Resp: 18 16  20   Temp: 98.1 F (36.7 C) 98.5 F (36.9 C)  98.2 F (36.8 C)  TempSrc: Axillary Oral  Oral  SpO2: 100% 100%  100%  Weight:   54.6 kg   Height:        Intake/Output Summary (Last 24 hours) at 10/10/2022 1237 Last data filed at 10/10/2022 1100 Gross per 24 hour  Intake 2088 ml  Output 1450 ml  Net 638 ml   Filed Weights   10/08/22 0427 10/09/22 0500 10/10/22 0500  Weight: 53.8 kg 51 kg 54.6 kg    Examination: No significant change from prior exam.  General exam: Frail appearing no acute distress.   Respiratory system: Clear to auscultation no wheezes or rales. Cardiovascular system: S1 & S2 heard, regular rate and rhythm Gastrointestinal  system: Abdomen is soft  Central nervous system: Alert and minimally interactive. Refuses to participate Extremities: No edema Skin: No significant lesions noted    Data Reviewed: I have personally reviewed following labs and imaging studies  CBC: Recent Labs  Lab 10/06/22 0536 10/07/22 0558 10/08/22 0856  WBC 3.8* 3.8* 4.8  HGB 12.2* 11.8* 11.1*  HCT 38.9* 38.2* 36.0*  MCV 77.5* 78.8* 78.9*  PLT 206 208 219   Basic Metabolic Panel: Recent Labs  Lab 10/06/22 0536 10/07/22 0558 10/08/22 0856  NA 136 134* 133*  K 4.3 3.8 4.0  CL 110 105 104  CO2 24 20* 22  GLUCOSE 97 107* 96  BUN 13 13 16   CREATININE 0.67 0.74 0.79  CALCIUM 8.7* 8.8* 8.6*  MG 1.8 1.7  --   PHOS  --  2.2*  --    GFR: Estimated Creatinine Clearance: 73.9 mL/min (by C-G formula based on SCr of 0.79 mg/dL). Liver Function Tests: Recent Labs  Lab  10/07/22 0558  AST 25  ALT 31  ALKPHOS 58  BILITOT 0.3  PROT 6.6  ALBUMIN 2.9*   No results for input(s): "LIPASE", "AMYLASE" in the last 168 hours. No results for input(s): "AMMONIA" in the last 168 hours.  Coagulation Profile: No results for input(s): "INR", "PROTIME" in the last 168 hours. Cardiac Enzymes: No results for input(s): "CKTOTAL", "CKMB", "CKMBINDEX", "TROPONINI" in the last 168 hours. BNP (last 3 results) No results for input(s): "PROBNP" in the last 8760 hours. HbA1C: No results for input(s): "HGBA1C" in the last 72 hours. CBG: Recent Labs  Lab 10/04/22 0020  GLUCAP 69*    Lipid Profile: No results for input(s): "CHOL", "HDL", "LDLCALC", "TRIG", "CHOLHDL", "LDLDIRECT" in the last 72 hours. Thyroid Function Tests: No results for input(s): "TSH", "T4TOTAL", "FREET4", "T3FREE", "THYROIDAB" in the last 72 hours. Anemia Panel: No results for input(s): "VITAMINB12", "FOLATE", "FERRITIN", "TIBC", "IRON", "RETICCTPCT" in the last 72 hours. Sepsis Labs: No results for input(s): "PROCALCITON", "LATICACIDVEN" in the last 168  hours.  No results found for this or any previous visit (from the past 240 hour(s)).       Radiology Studies: No results found.      Scheduled Meds:  divalproex  250 mg Oral Q8H   enoxaparin (LOVENOX) injection  40 mg Subcutaneous I71I   folic acid  1 mg Oral Daily   megestrol  200 mg Oral BID   nystatin   Topical BID   mouth rinse  15 mL Mouth Rinse 4 times per day   pantoprazole  40 mg Oral Daily   polyethylene glycol  17 g Oral BID   QUEtiapine  400 mg Oral BID   Continuous Infusions:  dextrose 5 % and 0.9% NaCl Stopped (10/08/22 2320)     LOS: 34 days    Time spent: 35 minutes    Wing Gfeller Darleen Crocker, DO [facility]  If 7PM-7AM, please contact night-coverage [email or web address] 10/10/2022, 12:37 PM

## 2022-10-10 NOTE — Progress Notes (Signed)
Palliative: [name], [name], is resting quietly in bed.  He appears acutely/chronically ill and very frail, cachectic.  He is alert, but does not attempt to answer orientation questions.  He clearly cannot make his basic needs known.  He must be fed by others.  There is no family at bedside at this time although bedside nursing staff is present attending to needs/safety sitter.  Overall, it seems that [name] is taking more by mouth intake.  It remains to be seen his ability to have meaningful recovery.  This is very difficult disposition on how to best care for [name].  Face-to-face meeting with inpatient hospice representative.  Conference with bedside nursing staff, transition of care team, local hospice representative related to patient condition, needs, goals of care, disposition.  Plan:   At this point continue to treat the treatable but no CPR or intubation.  Time for outcomes.  Transition of care team is attempting to find the best placement/care.  [name] now has a guardian through [place].  His DSS social worker is [name].  [place] has INTERIM guardianship with court date scheduled for February 7.   50 minutes  Quinn Axe, NP Palliative medicine team Team phone [phone] Greater than 50% of this time was spent counseling and coordinating care related to the above assessment and plan.
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
## 2022-10-10 NOTE — Progress Notes (Signed)
[facility]) Hospital Liaison Note  Received request from Shade Flood, [facility] for interest in the Hospice Home. Evaluated patient at bedside and report exchanged with RN. Unfortunately patient is not approved for the Hospice Home. Spoke with patient's legal guardian, Cheri Rous, to provide an update.  Please do not hesitate to call with any questions.    Thank you, Zigmund Gottron RN  [facility] Liaison [phone]

## 2022-10-10 NOTE — TOC Progression Note (Addendum)
Transition of Care [facility]) - Progression Note    Patient Details  Name: ERHARD SENSKE MRN: [id number] Date of Birth: [birth year]  Transition of [facility]) CM/SW Contact  Shade Flood, LCSW Phone Number: 10/10/2022, 1:44 PM  Clinical Narrative:     TOC following. Hospice RN from [facility] has evaluated pt and stated that he is not eligible for residential hospice at this time.   Referral sent to [facility] to request their assessment as well. Awaiting determination.  Will follow.  1505: [facility] states pt is not appropriate for inpatient hospice. TOC will follow to continue working on placement at [facility] with hospice care.  Expected Discharge Plan: [place] Barriers to Discharge: Continued Medical Work up  Expected Discharge Plan and Services In-house Referral: Clinical Social Work   Post Acute Care Choice: [facility] Living arrangements for the past 2 months: [place] Determinants of Health (SDOH) Interventions SDOH Screenings   Tobacco Use: Medium Risk (09/06/2022)    Readmission Risk Interventions    09/07/2022   12:35 PM  Readmission Risk Prevention Plan  Medication Screening Complete  Transportation Screening Complete

## 2022-10-11 DIAGNOSIS — G9341 Metabolic encephalopathy: Secondary | ICD-10-CM | POA: Diagnosis not present

## 2022-10-11 MED ORDER — ACETAMINOPHEN 500 MG PO TABS
500.0000 mg | ORAL_TABLET | Freq: Two times a day (BID) | ORAL | Status: DC
  Administered 2022-10-11 – 2022-10-27 (×34): 500 mg via ORAL
  Filled 2022-10-11 (×34): qty 1

## 2022-10-11 NOTE — Progress Notes (Signed)
Morning medications not given due to pt being agitated and now currently resting.

## 2022-10-11 NOTE — Progress Notes (Signed)
PROGRESS NOTE    [name]  EVO:[id number] DOB: June 08, 1961 DOA: 09/05/2022 PCP: [facility]   Brief Narrative:    [name] is a 62 yo male with PMH dementia, bipolar 1 disorder, HLD, HTN, CAD, CVA who presented with confusion and dysphagia. With further workup he was also found to be agitated/impulsive, and unable to be redirected.  He required Haldol and Ativan.  His significant other was concerned about his inability to swallow and he was brought for further workup. Because of the difficulty eating/dysphagia, he was initially initiated on a stroke workup.  However, further workup also was notable for an elevated lithium level.  Given his presenting symptoms, it was felt that they were attributed to lithium toxicity.  Poison control was called and patient was started on fluids and admitted. Despite aggressive measures, he showed little improvement during hospitalization.  Given his poor physical reserve and declining functional status leading up to hospitalization, he appeared to be more consistent with failure to thrive.  Goals of care discussions were held with his significant other who is his main caretaker and decision maker.  He was made DNR and continued on medical treatment in the hopes for some improvement however his prognosis was made clear that it appears to be worsening daily.  Palliative continues to have ongoing discussions with the girlfriend and ethics is involved.    09/18/22 -Had face-to-face Conference with patient's girlfriend on 09/13/2022 -Patient girlfriend requested that I do Not call her to talk to her about patient's poor prognosis anymore, because it makes her sad.- -Patient's girlfriend is accusing the medical team of "prematurely giving up on patient" --She tells me that she is not ready to make any decisions about possible de-escalation of care until the middle of January at the earliest -oral intake remains very  poor -Hypoglycemia with blood sugar of 69 noted despite continuous IV dextrose drip-- -will increase IV dextrose rate -Overall prognosis remains poor given very poor oral intake due to persistent metabolic encephalopathy superimposed on advanced dementia   09/19/22 -Remain calm comfortable, remained calm, comfortable, sleepy -Refused to eat or to be fed   09/20/2022 -Agitated confused overnight, Ativan and Haldol was used overnight -Appreciate palliative care following, and consulted ethics committee Current recommendation is neuroconsultation, attempt of Dobbhoff placement and initiation of tube feeding Fails we will proceed with comfort care     09/21/2022 -Some, minimal interaction between him and the nursing staff noted this morning with some oral intake -Plan remains for Dobbhoff placement per ethics committee recommendations Nutrition to be consulted for tube feeds -Psych has been consulted for evaluation -Pending neuro eval   09/22/2022 -Patient received Dobbhoff feeding tube placement by IR on 09/21/2022 -Nutrition has started tube feeding -Neurology has evaluated the patient-looking for reversible causes -Agitated overnight received Ativan around 3 AM Unresponsive non-interactive this a.m.   09/23/2022 -Patient was seen and examined -Agitated earlier per nursing staff -Dobbhoff tube still in place, tube feeds,     09/24/2022 -A bit more responsive to the nursing staff at bedside was encouraging oral intake -Drop-offs still present -tolerating tube feeds     09/25/2022 -Opens his eyes but does not follow command, nonverbal, noncommunicating -Per nursing staff agitated earlier this morning, has had p.o. intake for first time today -Dobbhoff feeding tube still in place   09/26/22 -Patient is nonverbal and appears to have pulled out feeding tube on 1/9 -Appears to tolerating some oral intake -Trial of lactulose today due  to elevated ammonia levels -Depakote recently increased  by behavioral health -Trial of Megace to see if this helps to improve appetite -Continue folate repletion -Appears to be having more of a failure to thrive picture and is appropriate for hospice  09/27/22 -Calorie count initiated to help see if pt meeting caloric needs -As per Neurology note, patient is an appropriate candidate for hospice. Since next of kin cannot be contacted at this point, attempts will be made to transfer to facility for hospice care.   09/28/22-09/29/22 -DSS evaluating for emergency guardianship at which point patient can be discharged once a facility has been obtained.  09/30/22-10/01/22 Patient has obtained emergency guardianship and is now awaiting placement.  Still requires sitter at bedside, plan to start Seroquel to assist with behavioral disturbances.  10/02/22 Plan to increase Seroquel dosing to 50 mg twice daily due to ongoing agitation and ongoing need for sitter.  10/03/22 Increase Seroquel to 100 mg twice daily with ongoing agitation noted overnight.  10/04/22 Increase Seroquel to 150 mg twice daily and continue sitter.  10/05/22 Increase Seroquel to 200 mg twice daily and continue sitter.  TTS evaluation to see if there could be some assistance in helping manage his behavior.  Trying to avoid the need for sitter so placement can be pursued.  10/06/22: The patient was seen and examined at his bedside.  Does not answer questions.  Per sitter in the room he was communicating with her earlier.  Ate without any difficulties.  10/07/2022: More agitated today.  Requiring additional dose of IV Ativan.  10/08/2022: Restless.  Still needing one-to-one sitter.  10/09/2022: Patient still restless and needing one-to-one sitter.  Increase Seroquel to 400 mg twice daily.  Consider change to Risperdal if needed.  10/10/22: Patient seen by hospice nurse and not noted to be a candidate for hospice facility.  Will continue to try other facilities.  Still requiring sitter at  bedside. Plan to increase Depakote dose at bedtime.  10/11/22: Patient continues to remain agitated.  Appreciate further psychiatric input with repeat consult placed.  Oral intake adequate, discontinue IV fluid.  Hospice facilities have declined patient, will look into SNF with hospice in the near future.  Assessment & Plan:   Principal Problem:   Acute metabolic encephalopathy Active Problems:   Bipolar 1 disorder (HCC)   Dysphagia   Lithium toxicity   Goals of care, counseling/discussion   Severe/Advanced Dementia with behavioral disturbance (HCC)   FTT (failure to thrive) in adult   Hypernatremia   Hyperlipidemia   Prolonged QT interval   Protein-calorie malnutrition, severe  Assessment and Plan:   1)Lithium Toxicity Lithium discontinued. Psychiatry following.  Lithium level 1.51 on admission -- lithium level normalized and dropped -No change in mentation,-not interactive -Status post psych/TTS evaluation - appreciate tele-psych rec's; he has been taken off lithium and started on depakote; continue p.o. for now and if necessary,  transition to IV   - [name]: Cachectic, minimally interactive, poor p.o. intake - poor QOL with decline as evidenced by severe cachexia;  -  DNR -palliative following     2) Advanced dementia with significant cognitive and memory deficits-encephalopathic -progressive decline-in setting of dementia   -Neurology consulted: Reviewing and evaluating for reversible causes-unlikely change in mentation at this point -Psych/CTS evaluation, adjusting medication - unlikely to reverse his condition at this point   -patient significant other [name]  recalls that the patient has had significant memory and cognitive decline over the last several years -Overall 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 Dossie Arbour -recommended trial of Dobbhoff feeding tube placement and tube feeding -      -No improvement-minimally interactive, impulsive,-continue to pull out IVs telemetry lines -As needed Ativan, Haldol -No PO nutritional intake -IV fluid limited as he is pulling the line out   -Palliative care team, and ethics committee consulted: Recommended: Attempt Dobbhoff tube and tube feeds if fails progress to comfort care Recommended Neuro consultation   - patient has been hospitalized for days at that point and he has shown no signs of improvement.  He remains minimally interactive and impulsive, constantly trying to climb out of bed.  He has pulled IV out as well as telemetry off repetitively.   -He also has had extremely minimal nutritional intake since admission.    -WE do not find an to be a good candidate for G- tube placement as he will inevitably pull this out as well and PEG tube placement also considered medically futile and inappropriate in context of his multiple comorbidities, poor quality of life, and progressive functional decline   - Discussed with Sallye Ober that he does not appear to have the physical reserve to overcome the events that have led up to this hospitalization.  He may also not survive this hospitalization.        - [name] agrees with DNR status at this time. She does wish for ongoing attempts/treatment to see if he improves, but should he continue to decline (which I expect), then a natural death will be allowed. He is currently not a rehab candidate unless were to show tremendous improvement and rather he appears more appropriate for inpatient vs residential hospice depending on course of events over the next few days -Overall prognosis remains poor/grave -Family contact--   Deneise Lever [phone] -As per [name] .... No living family members.  -Palliative working on contacting Ms. Scales       4)Hypernatremia -resolved   5)Acute metabolic encephalopathy in setting of baseline advanced/severe dementia with significant cognitive and memory deficits at baseline - Etiology attributed to probable lithium toxicity - CT head unremarkable for acute findings.  Chronic left frontal lobe infarct appreciated -MRI brain also negative for acute stroke -Patient is reported to have some altered mentation at baseline as well; per girlfriend,  he is mostly nonverbal at baseline----please see #2 above -Continue sitter -Continue assist of nursing staff -Trial of lactulose for elevated ammonia level -Continue folate repletion -Depakote as adjusted per behavioral health   6)Dysphagia -Multifactorial including lithium toxicity, psychiatric disorder refusing oral intake -First oral intake 09/25/2022   -Per ethic committee trial of Dobbhoff tube place and  enteric feeding if it fails we will proceed with comfort care   - Recent EGD June 2023, cannot see results, but per GI had gastritis and Candida esophagitis - unfortunately no signs of improvement still - see GOC - evaluated by SLP - continue offering diet as able; poor candidate for NGT and PEG candidate; not appropriate for TPN (No clear goals for long-term solution) -Pulled out Dobbhoff tube     Leukocytosis -likely  reactive-resolved -Chest x-ray on 09/17/2022 without acute findings -Get blood cultures, check UA -Hold off on antibiotics   Resolved prolonged QT interval - Initial QTc was prolonged Repeated 12 EKG with QTc 429 on 10/07/2022 Avoid QTc prolonging agents   Hyperlipidemia -discontinued statin, risk outweighs the benefit for this patient   Bipolar 1 disorder ([place]) - Discontinued lithium, per psych recommendation on Cogentin, IV Depakote - haldol / ativan PRN   Contamination of blood culture-resolved as of 09/09/2022 - 1/4 bottles noted with GPC from 12/20 but apparently nothing seen on media per micro. Has remained afebrile with minimal leukocytosis (considered reactive from other processes) - Will Repeat blood culture if becomes febrile      DVT prophylaxis:Lovenox subcu daily. Code Status: Full Family Communication: Tried calling significant other with no response on multiple days Disposition Plan:  Status is: Inpatient Remains inpatient appropriate because: Need for ongoing close monitoring and IV medications.   Consultants:  Palliative Neurology Behavioral health Ethics TTS 1/19  Procedures:  None  Antimicrobials:  Anti-infectives (From admission, onward)    Start     Dose/Rate Route Frequency Ordered Stop   09/06/22 0900  voriconazole (VFEND) tablet 200 mg  Status:  Discontinued        200 mg Oral  Once 09/05/22 1941 09/07/22 0815       Objective: Vitals:   10/10/22 1608 10/10/22 2252 10/11/22 0435 10/11/22 0500  BP: (!) 114/91 129/79 136/82   Pulse: 98 92 89   Resp: 20 20 18    Temp:  98 F (36.7 C) 97.8 F (36.6 C)   TempSrc:  Axillary Axillary   SpO2:  98% 98%   Weight:    52.6 kg  Height:    5\' 8"  (1.727 m)    Intake/Output Summary (Last 24 hours) at 10/11/2022 1035 Last data filed at 10/11/2022 0900 Gross per 24 hour  Intake 840 ml  Output 2800 ml  Net -1960 ml   Filed Weights   10/09/22 0500 10/10/22 0500 10/11/22 0500  Weight: 51 kg 54.6  kg 52.6 kg    Examination: No significant change from prior exam.  General exam: Frail appearing no acute distress.   Respiratory system: Clear to auscultation no wheezes or rales. Cardiovascular system: S1 & S2 heard, regular rate and rhythm Gastrointestinal system: Abdomen is soft  Central nervous system: Alert and minimally interactive. Refuses to participate Extremities: No edema Skin: No significant lesions noted    Data Reviewed: I have personally reviewed following labs and imaging studies  CBC: Recent Labs  Lab 10/06/22 0536 10/07/22 0558 10/08/22 0856  WBC 3.8* 3.8* 4.8  HGB 12.2* 11.8* 11.1*  HCT 38.9* 38.2* 36.0*  MCV 77.5* 78.8* 78.9*  PLT 206 208 938   Basic Metabolic Panel: Recent Labs  Lab 10/06/22 0536 10/07/22 0558 10/08/22 0856  NA 136 134* 133*  K 4.3 3.8 4.0  CL 110 105 104  CO2 24 20* 22  GLUCOSE 97 107* 96  BUN 13 13 16   CREATININE 0.67 0.74 0.79  CALCIUM 8.7* 8.8*  8.6*  MG 1.8 1.7  --   PHOS  --  2.2*  --    GFR: Estimated Creatinine Clearance: 71.2 mL/min (by C-G formula based on SCr of 0.79 mg/dL). Liver Function Tests: Recent Labs  Lab 10/07/22 0558  AST 25  ALT 31  ALKPHOS 58  BILITOT 0.3  PROT 6.6  ALBUMIN 2.9*   No results for input(s): "LIPASE", "AMYLASE" in the last 168 hours. No results for input(s): "AMMONIA" in the last 168 hours.  Coagulation Profile: No results for input(s): "INR", "PROTIME" in the last 168 hours. Cardiac Enzymes: No results for input(s): "CKTOTAL", "CKMB", "CKMBINDEX", "TROPONINI" in the last 168 hours. BNP (last 3 results) No results for input(s): "PROBNP" in the last 8760 hours. HbA1C: No results for input(s): "HGBA1C" in the last 72 hours. CBG: No results for input(s): "GLUCAP" in the last 168 hours.   Lipid Profile: No results for input(s): "CHOL", "HDL", "LDLCALC", "TRIG", "CHOLHDL", "LDLDIRECT" in the last 72 hours. Thyroid Function Tests: No results for input(s): "TSH",  "T4TOTAL", "FREET4", "T3FREE", "THYROIDAB" in the last 72 hours. Anemia Panel: No results for input(s): "VITAMINB12", "FOLATE", "FERRITIN", "TIBC", "IRON", "RETICCTPCT" in the last 72 hours. Sepsis Labs: No results for input(s): "PROCALCITON", "LATICACIDVEN" in the last 168 hours.  No results found for this or any previous visit (from the past 240 hour(s)).       Radiology Studies: No results found.      Scheduled Meds:  acetaminophen  500 mg Oral BID   divalproex  250 mg Oral Q12H   divalproex  500 mg Oral QHS   enoxaparin (LOVENOX) injection  40 mg Subcutaneous Q24H   folic acid  1 mg Oral Daily   megestrol  200 mg Oral BID   nystatin   Topical BID   mouth rinse  15 mL Mouth Rinse 4 times per day   pantoprazole  40 mg Oral Daily   polyethylene glycol  17 g Oral BID   QUEtiapine  400 mg Oral BID      LOS: 35 days    Time spent: 35 minutes    Paz Fuentes Hoover Brunette, DO [facility]  If 7PM-7AM, please contact night-coverage [email or web address] 10/11/2022, 10:35 AM

## 2022-10-11 NOTE — TOC Progression Note (Signed)
Transition of Care [facility]) - Progression Note    Patient Details  Name: [name] MRN: [id number] Date of Birth: 1960-12-30  [facility] of [facility] CM/SW Contact  Shade Flood, LCSW Phone Number: 10/11/2022, 10:45 AM  Clinical Narrative:     TOC following. Per MD, he will re-consult TTS for assistance with medication adjustments. TOC continuing to work on placement for pt to transition to once he not longer has agitation and behavioral disturbance. TOC leadership involved and assisting as well.  Will follow.  Expected Discharge Plan: [place] Barriers to Discharge: Continued Medical Work up  Expected Discharge Plan and Services In-house Referral: Clinical Social Work   Post Acute Care Choice: [place] Living arrangements for the past 2 months: [place] Determinants of Health (SDOH) Interventions SDOH Screenings   Tobacco Use: Medium Risk (09/06/2022)    Readmission Risk Interventions    09/07/2022   12:35 PM  Readmission Risk Prevention Plan  Medication Screening Complete  Transportation Screening Complete
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
## 2022-10-12 DIAGNOSIS — G9341 Metabolic encephalopathy: Secondary | ICD-10-CM | POA: Diagnosis not present

## 2022-10-12 LAB — GLUCOSE, CAPILLARY
Glucose-Capillary: 82 mg/dL (ref 70–99)
Glucose-Capillary: 98 mg/dL (ref 70–99)

## 2022-10-12 LAB — VALPROIC ACID LEVEL: Valproic Acid Lvl: 43 ug/mL — ABNORMAL LOW (ref 50.0–100.0)

## 2022-10-12 NOTE — Progress Notes (Signed)
Patient noted to be rubbing knees against bed padding and rubbing with mittens, right knee slightly red. Applied foam dressings to each knee for protection.

## 2022-10-12 NOTE — Progress Notes (Signed)
PROGRESS NOTE    [name]  EVO:[id number] DOB: June 08, 1961 DOA: 09/05/2022 PCP: [facility]   Brief Narrative:    [name] is a 62 yo male with PMH dementia, bipolar 1 disorder, HLD, HTN, CAD, CVA who presented with confusion and dysphagia. With further workup he was also found to be agitated/impulsive, and unable to be redirected.  He required Haldol and Ativan.  His significant other was concerned about his inability to swallow and he was brought for further workup. Because of the difficulty eating/dysphagia, he was initially initiated on a stroke workup.  However, further workup also was notable for an elevated lithium level.  Given his presenting symptoms, it was felt that they were attributed to lithium toxicity.  Poison control was called and patient was started on fluids and admitted. Despite aggressive measures, he showed little improvement during hospitalization.  Given his poor physical reserve and declining functional status leading up to hospitalization, he appeared to be more consistent with failure to thrive.  Goals of care discussions were held with his significant other who is his main caretaker and decision maker.  He was made DNR and continued on medical treatment in the hopes for some improvement however his prognosis was made clear that it appears to be worsening daily.  Palliative continues to have ongoing discussions with the girlfriend and ethics is involved.    09/18/22 -Had face-to-face Conference with patient's girlfriend on 09/13/2022 -Patient girlfriend requested that I do Not call her to talk to her about patient's poor prognosis anymore, because it makes her sad.- -Patient's girlfriend is accusing the medical team of "prematurely giving up on patient" --She tells me that she is not ready to make any decisions about possible de-escalation of care until the middle of January at the earliest -oral intake remains very  poor -Hypoglycemia with blood sugar of 69 noted despite continuous IV dextrose drip-- -will increase IV dextrose rate -Overall prognosis remains poor given very poor oral intake due to persistent metabolic encephalopathy superimposed on advanced dementia   09/19/22 -Remain calm comfortable, remained calm, comfortable, sleepy -Refused to eat or to be fed   09/20/2022 -Agitated confused overnight, Ativan and Haldol was used overnight -Appreciate palliative care following, and consulted ethics committee Current recommendation is neuroconsultation, attempt of Dobbhoff placement and initiation of tube feeding Fails we will proceed with comfort care     09/21/2022 -Some, minimal interaction between him and the nursing staff noted this morning with some oral intake -Plan remains for Dobbhoff placement per ethics committee recommendations Nutrition to be consulted for tube feeds -Psych has been consulted for evaluation -Pending neuro eval   09/22/2022 -Patient received Dobbhoff feeding tube placement by IR on 09/21/2022 -Nutrition has started tube feeding -Neurology has evaluated the patient-looking for reversible causes -Agitated overnight received Ativan around 3 AM Unresponsive non-interactive this a.m.   09/23/2022 -Patient was seen and examined -Agitated earlier per nursing staff -Dobbhoff tube still in place, tube feeds,     09/24/2022 -A bit more responsive to the nursing staff at bedside was encouraging oral intake -Drop-offs still present -tolerating tube feeds     09/25/2022 -Opens his eyes but does not follow command, nonverbal, noncommunicating -Per nursing staff agitated earlier this morning, has had p.o. intake for first time today -Dobbhoff feeding tube still in place   09/26/22 -Patient is nonverbal and appears to have pulled out feeding tube on 1/9 -Appears to tolerating some oral intake -Trial of lactulose today due  to elevated ammonia levels -Depakote recently increased  by behavioral health -Trial of Megace to see if this helps to improve appetite -Continue folate repletion -Appears to be having more of a failure to thrive picture and is appropriate for hospice  09/27/22 -Calorie count initiated to help see if pt meeting caloric needs -As per Neurology note, patient is an appropriate candidate for hospice. Since next of kin cannot be contacted at this point, attempts will be made to transfer to facility for hospice care.   09/28/22-09/29/22 -DSS evaluating for emergency guardianship at which point patient can be discharged once a facility has been obtained.  09/30/22-10/01/22 Patient has obtained emergency guardianship and is now awaiting placement.  Still requires sitter at bedside, plan to start Seroquel to assist with behavioral disturbances.  10/02/22 Plan to increase Seroquel dosing to 50 mg twice daily due to ongoing agitation and ongoing need for sitter.  10/03/22 Increase Seroquel to 100 mg twice daily with ongoing agitation noted overnight.  10/04/22 Increase Seroquel to 150 mg twice daily and continue sitter.  10/05/22 Increase Seroquel to 200 mg twice daily and continue sitter.  TTS evaluation to see if there could be some assistance in helping manage his behavior.  Trying to avoid the need for sitter so placement can be pursued.  10/06/22: The patient was seen and examined at his bedside.  Does not answer questions.  Per sitter in the room he was communicating with her earlier.  Ate without any difficulties.  10/07/2022: More agitated today.  Requiring additional dose of IV Ativan.  10/08/2022: Restless.  Still needing one-to-one sitter.  10/09/2022: Patient still restless and needing one-to-one sitter.  Increase Seroquel to 400 mg twice daily.  Consider change to Risperdal if needed.  10/10/22-1/25: Patient seen by hospice nurse and not noted to be a candidate for hospice facility.  Will continue to try other facilities.  Still requiring sitter at  bedside. Plan to increase Depakote dose at bedtime.  10/12/22: Awaiting repeat TTS evaluation as patient continues to have ongoing agitation issues requiring Ativan.  Sitter at bedside.  Assessment & Plan:   Principal Problem:   Acute metabolic encephalopathy Active Problems:   Bipolar 1 disorder (HCC)   Dysphagia   Lithium toxicity   Goals of care, counseling/discussion   Severe/Advanced Dementia with behavioral disturbance (HCC)   FTT (failure to thrive) in adult   Hypernatremia   Hyperlipidemia   Prolonged QT interval   Protein-calorie malnutrition, severe  Assessment and Plan:   1)Lithium Toxicity Lithium discontinued. Psychiatry following.  Lithium level 1.51 on admission -- lithium level normalized and dropped -No change in mentation,-not interactive -Status post psych/TTS evaluation - appreciate tele-psych rec's; he has been taken off lithium and started on depakote; continue p.o. for now and if necessary,  transition to IV   - [name]: Cachectic, minimally interactive, poor p.o. intake - poor QOL with decline as evidenced by severe cachexia;  -  DNR -palliative following     2) Advanced dementia with significant cognitive and memory deficits-encephalopathic -progressive decline-in setting of dementia   -Neurology consulted: Reviewing and evaluating for reversible causes-unlikely change in mentation at this point -Psych/CTS evaluation, adjusting medication - unlikely to reverse his condition at this point   -patient significant other [name]  recalls that the patient has had significant memory and cognitive decline over the last several years -Overall 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 Dossie Arbour -recommended trial of Dobbhoff feeding tube placement and tube feeding -      -No improvement-minimally interactive, impulsive,-continue to pull out IVs telemetry lines -As needed Ativan, Haldol -No PO nutritional intake -IV fluid limited as he is pulling the line out   -Palliative care team, and ethics committee consulted: Recommended: Attempt Dobbhoff tube and tube feeds if fails progress to comfort care Recommended Neuro consultation   - patient has been hospitalized for days at that point and he has shown no signs of improvement.  He remains minimally interactive and impulsive, constantly trying to climb out of bed.  He has pulled IV out as well as telemetry off repetitively.   -He also has had extremely minimal nutritional intake since admission.    -WE do not find an to be a good candidate for G- tube placement as he will inevitably pull this out as well and PEG tube placement also considered medically futile and inappropriate in context of his multiple comorbidities, poor quality of life, and progressive functional decline   - Discussed with Sallye Ober that he does not appear to have the physical reserve to overcome the events that have led up to this hospitalization.  He may also not survive this hospitalization.       - [name] agrees with DNR status at this time. She does wish for ongoing attempts/treatment to see if he  improves, but should he continue to decline (which I expect), then a natural death will be allowed. He is currently not a rehab candidate unless were to show tremendous improvement and rather he appears more appropriate for inpatient vs residential hospice depending on course of events over the next few days -Overall prognosis remains poor/grave -Family contact--   Deneise Lever [phone] -As per [name] .... No living family members.  -Palliative working on contacting Ms. Scales       4)Hypernatremia -resolved   5)Acute metabolic encephalopathy in setting of baseline advanced/severe dementia with significant cognitive and memory deficits at baseline - Etiology attributed to probable lithium toxicity - CT head unremarkable for acute findings.  Chronic left frontal lobe infarct appreciated -MRI brain also negative for acute stroke -Patient is reported to have some altered mentation at baseline as well; per girlfriend,  he is mostly nonverbal at baseline----please see #2 above -Continue sitter -Continue assist of nursing staff -Trial of lactulose for elevated ammonia level -Continue folate repletion -Depakote as adjusted per behavioral health   6)Dysphagia -Multifactorial including lithium toxicity, psychiatric disorder refusing oral intake -First oral intake 09/25/2022   -Per ethic committee trial of Dobbhoff tube place and enteric feeding if it fails we will proceed with comfort care   - Recent EGD June 2023, cannot  see results, but per GI had gastritis and Candida esophagitis - unfortunately no signs of improvement still - see GOC - evaluated by SLP - continue offering diet as able; poor candidate for NGT and PEG candidate; not appropriate for TPN (No clear goals for long-term solution) -Pulled out Dobbhoff tube     Leukocytosis -likely reactive-resolved -Chest x-ray on 09/17/2022 without acute findings -Get blood cultures, check UA -Hold off on antibiotics    Resolved prolonged QT interval - Initial QTc was prolonged Repeated 12 EKG with QTc 429 on 10/07/2022 Avoid QTc prolonging agents   Hyperlipidemia -discontinued statin, risk outweighs the benefit for this patient   Bipolar 1 disorder (HCC) - Discontinued lithium, per psych recommendation on Cogentin, IV Depakote - haldol / ativan PRN   Contamination of blood culture-resolved as of 09/09/2022 - 1/4 bottles noted with GPC from 12/20 but apparently nothing seen on media per micro. Has remained afebrile with minimal leukocytosis (considered reactive from other processes) - Will Repeat blood culture if becomes febrile      DVT prophylaxis:Lovenox subcu daily. Code Status: Full Family Communication: Tried calling significant other with no response on multiple days Disposition Plan:  Status is: Inpatient Remains inpatient appropriate because: Need for ongoing close monitoring and IV medications.   Consultants:  Palliative Neurology Behavioral health Ethics TTS 1/19  Procedures:  None  Antimicrobials:  Anti-infectives (From admission, onward)    Start     Dose/Rate Route Frequency Ordered Stop   09/06/22 0900  voriconazole (VFEND) tablet 200 mg  Status:  Discontinued        200 mg Oral  Once 09/05/22 1941 09/07/22 0815       Objective: Vitals:   10/11/22 0500 10/11/22 1853 10/11/22 2200 10/12/22 0459  BP:  (!) 154/82 107/70 134/82  Pulse:  75 68 97  Resp:  20 16 18   Temp:  98.2 F (36.8 C)  98 F (36.7 C)  TempSrc:  Axillary  Axillary  SpO2:  100% 100% 99%  Weight: 52.6 kg   52.6 kg  Height: 5\' 8"  (1.727 m)       Intake/Output Summary (Last 24 hours) at 10/12/2022 0958 Last data filed at 10/12/2022 0900 Gross per 24 hour  Intake 238 ml  Output 1000 ml  Net -762 ml   Filed Weights   10/10/22 0500 10/11/22 0500 10/12/22 0459  Weight: 54.6 kg 52.6 kg 52.6 kg    Examination: No significant change from prior exam.  General exam: Frail appearing no acute  distress.   Respiratory system: Clear to auscultation no wheezes or rales. Cardiovascular system: S1 & S2 heard, regular rate and rhythm Gastrointestinal system: Abdomen is soft  Central nervous system: Alert and minimally interactive. Refuses to participate Extremities: No edema Skin: No significant lesions noted    Data Reviewed: I have personally reviewed following labs and imaging studies  CBC: Recent Labs  Lab 10/06/22 0536 10/07/22 0558 10/08/22 0856  WBC 3.8* 3.8* 4.8  HGB 12.2* 11.8* 11.1*  HCT 38.9* 38.2* 36.0*  MCV 77.5* 78.8* 78.9*  PLT 206 208 219   Basic Metabolic Panel: Recent Labs  Lab 10/06/22 0536 10/07/22 0558 10/08/22 0856  NA 136 134* 133*  K 4.3 3.8 4.0  CL 110 105 104  CO2 24 20* 22  GLUCOSE 97 107* 96  BUN 13 13 16   CREATININE 0.67 0.74 0.79  CALCIUM 8.7* 8.8* 8.6*  MG 1.8 1.7  --   PHOS  --  2.2*  --  GFR: Estimated Creatinine Clearance: 71.2 mL/min (by C-G formula based on SCr of 0.79 mg/dL). Liver Function Tests: Recent Labs  Lab 10/07/22 0558  AST 25  ALT 31  ALKPHOS 58  BILITOT 0.3  PROT 6.6  ALBUMIN 2.9*   No results for input(s): "LIPASE", "AMYLASE" in the last 168 hours. No results for input(s): "AMMONIA" in the last 168 hours.  Coagulation Profile: No results for input(s): "INR", "PROTIME" in the last 168 hours. Cardiac Enzymes: No results for input(s): "CKTOTAL", "CKMB", "CKMBINDEX", "TROPONINI" in the last 168 hours. BNP (last 3 results) No results for input(s): "PROBNP" in the last 8760 hours. HbA1C: No results for input(s): "HGBA1C" in the last 72 hours. CBG: No results for input(s): "GLUCAP" in the last 168 hours.   Lipid Profile: No results for input(s): "CHOL", "HDL", "LDLCALC", "TRIG", "CHOLHDL", "LDLDIRECT" in the last 72 hours. Thyroid Function Tests: No results for input(s): "TSH", "T4TOTAL", "FREET4", "T3FREE", "THYROIDAB" in the last 72 hours. Anemia Panel: No results for input(s): "VITAMINB12",  "FOLATE", "FERRITIN", "TIBC", "IRON", "RETICCTPCT" in the last 72 hours. Sepsis Labs: No results for input(s): "PROCALCITON", "LATICACIDVEN" in the last 168 hours.  No results found for this or any previous visit (from the past 240 hour(s)).       Radiology Studies: No results found.      Scheduled Meds:  acetaminophen  500 mg Oral BID   divalproex  250 mg Oral Q12H   divalproex  500 mg Oral QHS   enoxaparin (LOVENOX) injection  40 mg Subcutaneous V76H   folic acid  1 mg Oral Daily   megestrol  200 mg Oral BID   nystatin   Topical BID   mouth rinse  15 mL Mouth Rinse 4 times per day   pantoprazole  40 mg Oral Daily   polyethylene glycol  17 g Oral BID   QUEtiapine  400 mg Oral BID   Continuous Infusions:     LOS: 36 days    Time spent: 35 minutes    Saran Laviolette Darleen Crocker, DO [facility]  If 7PM-7AM, please contact night-coverage [email or web address] 10/12/2022, 9:58 AM
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
## 2022-10-12 NOTE — Plan of Care (Signed)
  Problem: Education: Goal: Knowledge of disease or condition will improve Outcome: Progressing Goal: Knowledge of secondary prevention will improve (MUST DOCUMENT ALL) Outcome: Progressing   Problem: Coping: Goal: Will verbalize positive feelings about self Outcome: Progressing

## 2022-10-12 NOTE — Progress Notes (Signed)
Patient anxious and agitated this morning, swatting at staff, attempt to get OOB without assistance , given PRN ativan through IV access. Redirection not helpful in getting patient back to bed, assisted sitter to pull patient up and into bed, safety precautions in place, bed in lowest position. Will continue to monitor

## 2022-10-13 DIAGNOSIS — F03918 Unspecified dementia, unspecified severity, with other behavioral disturbance: Secondary | ICD-10-CM | POA: Diagnosis not present

## 2022-10-13 LAB — BASIC METABOLIC PANEL
Anion gap: 8 (ref 5–15)
BUN: 22 mg/dL (ref 8–23)
CO2: 24 mmol/L (ref 22–32)
Calcium: 9.1 mg/dL (ref 8.9–10.3)
Chloride: 107 mmol/L (ref 98–111)
Creatinine, Ser: 0.89 mg/dL (ref 0.61–1.24)
GFR, Estimated: >60 mL/min (ref >60)
Glucose, Bld: 87 mg/dL (ref 70–99)
Potassium: 4.4 mmol/L (ref 3.5–5.1)
Sodium: 139 mmol/L (ref 135–145)

## 2022-10-13 LAB — CBC
HCT: 37.8 % — ABNORMAL LOW (ref 39.0–52.0)
Hemoglobin: 11.8 g/dL — ABNORMAL LOW (ref 13.0–17.0)
MCH: 24.2 pg — ABNORMAL LOW (ref 26.0–34.0)
MCHC: 31.2 g/dL (ref 30.0–36.0)
MCV: 77.6 fL — ABNORMAL LOW (ref 80.0–100.0)
Platelets: 234 10*3/uL (ref 150–400)
RBC: 4.87 MIL/uL (ref 4.22–5.81)
RDW: 15.4 % (ref 11.5–15.5)
WBC: 3.3 10*3/uL — ABNORMAL LOW (ref 4.0–10.5)
nRBC: 0 % (ref 0.0–0.2)

## 2022-10-13 LAB — GLUCOSE, CAPILLARY
Glucose-Capillary: 66 mg/dL — ABNORMAL LOW (ref 70–99)
Glucose-Capillary: 80 mg/dL (ref 70–99)
Glucose-Capillary: 95 mg/dL (ref 70–99)

## 2022-10-13 LAB — MAGNESIUM: Magnesium: 2 mg/dL (ref 1.7–2.4)

## 2022-10-13 MED ORDER — DIVALPROEX SODIUM 125 MG PO CSDR
500.0000 mg | DELAYED_RELEASE_CAPSULE | Freq: Two times a day (BID) | ORAL | Status: DC
  Administered 2022-10-13 – 2022-10-14 (×2): 500 mg via ORAL
  Filled 2022-10-13 (×2): qty 4

## 2022-10-13 NOTE — Progress Notes (Signed)
Patient alert to self. He was able to take PO meds. Pt was restless and attempting to get out of bed PRN ativan given for anxiety and is effective. Safety sitter at bedside.

## 2022-10-13 NOTE — Plan of Care (Signed)
  Problem: Education: Goal: Knowledge of disease or condition will improve Outcome: Not Progressing Goal: Knowledge of secondary prevention will improve (MUST DOCUMENT ALL) Outcome: Not Progressing Goal: Knowledge of patient specific risk factors will improve (Mark N/A or DELETE if not current risk factor) Outcome: Not Progressing   Problem: Ischemic Stroke/TIA Tissue Perfusion: Goal: Complications of ischemic stroke/TIA will be minimized Outcome: Not Progressing   Problem: Coping: Goal: Will verbalize positive feelings about self Outcome: Not Progressing Goal: Will identify appropriate support needs Outcome: Not Progressing   Problem: Health Behavior/Discharge Planning: Goal: Ability to manage health-related needs will improve Outcome: Not Progressing Goal: Goals will be collaboratively established with patient/family Outcome: Not Progressing   Problem: Self-Care: Goal: Ability to participate in self-care as condition permits will improve Outcome: Not Progressing Goal: Verbalization of feelings and concerns over difficulty with self-care will improve Outcome: Not Progressing Goal: Ability to communicate needs accurately will improve Outcome: Not Progressing   Problem: Nutrition: Goal: Risk of aspiration will decrease Outcome: Not Progressing Goal: Dietary intake will improve Outcome: Not Progressing   

## 2022-10-13 NOTE — Progress Notes (Signed)
Per Michaele Offer, PMHNP pt is psych cleared. This CSW will now remove pt from the [facility] shift report. TOC will assist with any discharge needs.   Benjaman Kindler, MSW, LCSWA 10/13/2022 12:08 AM

## 2022-10-13 NOTE — Progress Notes (Signed)
PROGRESS NOTE    [name]  EVO:[id number] DOB: June 08, 1961 DOA: 09/05/2022 PCP: [facility]   Brief Narrative:    [name] is a 62 yo male with PMH dementia, bipolar 1 disorder, HLD, HTN, CAD, CVA who presented with confusion and dysphagia. With further workup he was also found to be agitated/impulsive, and unable to be redirected.  He required Haldol and Ativan.  His significant other was concerned about his inability to swallow and he was brought for further workup. Because of the difficulty eating/dysphagia, he was initially initiated on a stroke workup.  However, further workup also was notable for an elevated lithium level.  Given his presenting symptoms, it was felt that they were attributed to lithium toxicity.  Poison control was called and patient was started on fluids and admitted. Despite aggressive measures, he showed little improvement during hospitalization.  Given his poor physical reserve and declining functional status leading up to hospitalization, he appeared to be more consistent with failure to thrive.  Goals of care discussions were held with his significant other who is his main caretaker and decision maker.  He was made DNR and continued on medical treatment in the hopes for some improvement however his prognosis was made clear that it appears to be worsening daily.  Palliative continues to have ongoing discussions with the girlfriend and ethics is involved.    09/18/22 -Had face-to-face Conference with patient's girlfriend on 09/13/2022 -Patient girlfriend requested that I do Not call her to talk to her about patient's poor prognosis anymore, because it makes her sad.- -Patient's girlfriend is accusing the medical team of "prematurely giving up on patient" --She tells me that she is not ready to make any decisions about possible de-escalation of care until the middle of January at the earliest -oral intake remains very  poor -Hypoglycemia with blood sugar of 69 noted despite continuous IV dextrose drip-- -will increase IV dextrose rate -Overall prognosis remains poor given very poor oral intake due to persistent metabolic encephalopathy superimposed on advanced dementia   09/19/22 -Remain calm comfortable, remained calm, comfortable, sleepy -Refused to eat or to be fed   09/20/2022 -Agitated confused overnight, Ativan and Haldol was used overnight -Appreciate palliative care following, and consulted ethics committee Current recommendation is neuroconsultation, attempt of Dobbhoff placement and initiation of tube feeding Fails we will proceed with comfort care     09/21/2022 -Some, minimal interaction between him and the nursing staff noted this morning with some oral intake -Plan remains for Dobbhoff placement per ethics committee recommendations Nutrition to be consulted for tube feeds -Psych has been consulted for evaluation -Pending neuro eval   09/22/2022 -Patient received Dobbhoff feeding tube placement by IR on 09/21/2022 -Nutrition has started tube feeding -Neurology has evaluated the patient-looking for reversible causes -Agitated overnight received Ativan around 3 AM Unresponsive non-interactive this a.m.   09/23/2022 -Patient was seen and examined -Agitated earlier per nursing staff -Dobbhoff tube still in place, tube feeds,     09/24/2022 -A bit more responsive to the nursing staff at bedside was encouraging oral intake -Drop-offs still present -tolerating tube feeds     09/25/2022 -Opens his eyes but does not follow command, nonverbal, noncommunicating -Per nursing staff agitated earlier this morning, has had p.o. intake for first time today -Dobbhoff feeding tube still in place   09/26/22 -Patient is nonverbal and appears to have pulled out feeding tube on 1/9 -Appears to tolerating some oral intake -Trial of lactulose today due  to elevated ammonia levels -Depakote recently increased  by behavioral health -Trial of Megace to see if this helps to improve appetite -Continue folate repletion -Appears to be having more of a failure to thrive picture and is appropriate for hospice  09/27/22 -Calorie count initiated to help see if pt meeting caloric needs -As per Neurology note, patient is an appropriate candidate for hospice. Since next of kin cannot be contacted at this point, attempts will be made to transfer to facility for hospice care.   09/28/22-09/29/22 -DSS evaluating for emergency guardianship at which point patient can be discharged once a facility has been obtained.  09/30/22-10/01/22 Patient has obtained emergency guardianship and is now awaiting placement.  Still requires sitter at bedside, plan to start Seroquel to assist with behavioral disturbances.  10/02/22 Plan to increase Seroquel dosing to 50 mg twice daily due to ongoing agitation and ongoing need for sitter.  10/03/22 Increase Seroquel to 100 mg twice daily with ongoing agitation noted overnight.  10/04/22 Increase Seroquel to 150 mg twice daily and continue sitter.  10/05/22 Increase Seroquel to 200 mg twice daily and continue sitter.  TTS evaluation to see if there could be some assistance in helping manage his behavior.  Trying to avoid the need for sitter so placement can be pursued.  10/06/22: The patient was seen and examined at his bedside.  Does not answer questions.  Per sitter in the room he was communicating with her earlier.  Ate without any difficulties.  10/07/2022: More agitated today.  Requiring additional dose of IV Ativan.  10/08/2022: Restless.  Still needing one-to-one sitter.  10/09/2022: Patient still restless and needing one-to-one sitter.  Increase Seroquel to 400 mg twice daily.  Consider change to Risperdal if needed.  10/10/22-1/25: Patient seen by hospice nurse and not noted to be a candidate for hospice facility.  Will continue to try other facilities.  Still requiring sitter at  bedside. Plan to increase Depakote dose at bedtime.  10/12/22: Awaiting repeat TTS evaluation as patient continues to have ongoing agitation issues requiring Ativan.  Sitter at bedside.  10/13/22: TTS evaluation performed 1/26 with recommendations to check Depakote level which appeared to be subtherapeutic.  Increase Depakote dosing today.  Ativan ordered as needed.  Laboratory data within normal limits and sitter at bedside.  Assessment & Plan:   Principal Problem:   Severe/Advanced Dementia with behavioral disturbance (HCC) Active Problems:   Bipolar 1 disorder (HCC)   Acute metabolic encephalopathy   Dysphagia   FTT (failure to thrive) in adult  Assessment and Plan:   1)Lithium Toxicity Lithium discontinued. Psychiatry following.  Lithium level 1.51 on admission -- lithium level normalized and dropped -No change in mentation,-not interactive -Status post psych/TTS evaluation - appreciate tele-psych rec's; he has been taken off lithium and started on depakote; continue p.o. for now and if necessary,  transition to IV   - [name]: Cachectic, minimally interactive, poor p.o. intake - poor QOL with decline as evidenced by severe cachexia;  -  DNR -palliative following     2) Advanced dementia with significant cognitive and memory deficits-encephalopathic -progressive decline-in setting of dementia   -Neurology consulted: Reviewing and evaluating for reversible causes-unlikely change in mentation at this point -Psych/CTS evaluation, adjusting medication - unlikely to reverse his condition at this point   -patient significant other [name]  recalls that the patient has had significant memory and cognitive decline over the last several years -Overall 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 Verner Chol -recommended trial of Dobbhoff feeding tube placement and tube feeding -      -No improvement-minimally interactive, impulsive,-continue to pull out IVs telemetry lines -As needed Ativan, Haldol -No PO nutritional intake -IV fluid limited as he is pulling the line out   -Palliative care team, and ethics committee consulted: Recommended: Attempt Dobbhoff tube and tube feeds if fails progress to comfort care Recommended Neuro consultation   - patient has been hospitalized for days at that point and he has shown no signs of improvement.  He remains minimally interactive and impulsive, constantly trying to climb out of bed.  He has pulled IV out as well as telemetry off repetitively.   -He also has had extremely minimal nutritional intake since admission.    -WE do not find an to be a good candidate for G- tube placement as he will inevitably pull this out as well and PEG tube placement also considered medically futile and inappropriate in context of his multiple comorbidities, poor quality of life, and progressive functional decline   - Discussed with Barbaraann Share that he does not appear to have the physical reserve to overcome the events that have led up to this hospitalization.  He may also not survive this hospitalization.       - [name] agrees with DNR  status at this time. She does wish for ongoing attempts/treatment to see if he improves, but should he continue to decline (which I expect), then a natural death will be allowed. He is currently not a rehab candidate unless were to show tremendous improvement and rather he appears more appropriate for inpatient vs residential hospice depending on course of events over the next few days -Overall prognosis remains poor/grave -Family contact--   Richardean Chimera [phone] -As per [name] .... No living family members.  -Palliative working on contacting Ms. Scales       4)Hypernatremia -resolved   5)Acute metabolic encephalopathy in setting of baseline advanced/severe dementia with significant cognitive and memory deficits at baseline - Etiology attributed to probable lithium toxicity - CT head unremarkable for acute findings.  Chronic left frontal lobe infarct appreciated -MRI brain also negative for acute stroke -Patient is reported to have some altered mentation at baseline as well; per girlfriend,  he is mostly nonverbal at baseline----please see #2 above -Continue sitter -Continue assist of nursing staff -Trial of lactulose for elevated ammonia level -Continue folate repletion -Depakote as adjusted per behavioral health   6)Dysphagia -Multifactorial including lithium toxicity, psychiatric disorder refusing oral intake -First oral intake 09/25/2022   -Per ethic committee trial of Dobbhoff tube place and enteric feeding if it fails we will proceed  with comfort care   - Recent EGD June 2023, cannot see results, but per GI had gastritis and Candida esophagitis - unfortunately no signs of improvement still - see GOC - evaluated by SLP - continue offering diet as able; poor candidate for NGT and PEG candidate; not appropriate for TPN (No clear goals for long-term solution) -Pulled out Dobbhoff tube     Leukocytosis -likely reactive-resolved -Chest x-ray on 09/17/2022 without  acute findings -Get blood cultures, check UA -Hold off on antibiotics   Resolved prolonged QT interval - Initial QTc was prolonged Repeated 12 EKG with QTc 429 on 10/07/2022 Avoid QTc prolonging agents   Hyperlipidemia -discontinued statin, risk outweighs the benefit for this patient   Bipolar 1 disorder (HCC) - Discontinued lithium, per psych recommendation on Cogentin, IV Depakote - haldol / ativan PRN   Contamination of blood culture-resolved as of 09/09/2022 - 1/4 bottles noted with GPC from 12/20 but apparently nothing seen on media per micro. Has remained afebrile with minimal leukocytosis (considered reactive from other processes) - Will Repeat blood culture if becomes febrile      DVT prophylaxis:Lovenox subcu daily. Code Status: Full Family Communication: Tried calling significant other with no response on multiple days Disposition Plan:  Status is: Inpatient Remains inpatient appropriate because: Need for ongoing close monitoring and IV medications.   Consultants:  Palliative Neurology Behavioral health Ethics TTS 1/19  Procedures:  None  Antimicrobials:  Anti-infectives (From admission, onward)    Start     Dose/Rate Route Frequency Ordered Stop   09/06/22 0900  voriconazole (VFEND) tablet 200 mg  Status:  Discontinued        200 mg Oral  Once 09/05/22 1941 09/07/22 0815       Objective: Vitals:   10/12/22 0459 10/12/22 1300 10/12/22 1900 10/13/22 0437  BP: 134/82 (!) 132/97 135/84 132/78  Pulse: 97 86 77 72  Resp: 18 16 18 18   Temp: 98 F (36.7 C) 97.7 F (36.5 C) 97.6 F (36.4 C) 97.8 F (36.6 C)  TempSrc: Axillary Axillary Axillary Axillary  SpO2: 99% 100%  98%  Weight: 52.6 kg     Height:        Intake/Output Summary (Last 24 hours) at 10/13/2022 1035 Last data filed at 10/12/2022 1904 Gross per 24 hour  Intake 200 ml  Output 1050 ml  Net -850 ml   Filed Weights   10/10/22 0500 10/11/22 0500 10/12/22 0459  Weight: 54.6 kg 52.6 kg  52.6 kg    Examination: No significant change from prior exam.  General exam: Frail appearing no acute distress.   Respiratory system: Clear to auscultation no wheezes or rales. Cardiovascular system: S1 & S2 heard, regular rate and rhythm Gastrointestinal system: Abdomen is soft  Central nervous system: Alert and minimally interactive. Refuses to participate Extremities: No edema Skin: No significant lesions noted    Data Reviewed: I have personally reviewed following labs and imaging studies  CBC: Recent Labs  Lab 10/07/22 0558 10/08/22 0856 10/13/22 0523  WBC 3.8* 4.8 3.3*  HGB 11.8* 11.1* 11.8*  HCT 38.2* 36.0* 37.8*  MCV 78.8* 78.9* 77.6*  PLT 208 219 234   Basic Metabolic Panel: Recent Labs  Lab 10/07/22 0558 10/08/22 0856 10/13/22 0523  NA 134* 133* 139  K 3.8 4.0 4.4  CL 105 104 107  CO2 20* 22 24  GLUCOSE 107* 96 87  BUN 13 16 22   CREATININE 0.74 0.79 0.89  CALCIUM 8.8* 8.6* 9.1  MG 1.7  --  2.0  PHOS 2.2*  --   --    GFR: Estimated Creatinine Clearance: 64 mL/min (by C-G formula based on SCr of 0.89 mg/dL). Liver Function Tests: Recent Labs  Lab 10/07/22 0558  AST 25  ALT 31  ALKPHOS 58  BILITOT 0.3  PROT 6.6  ALBUMIN 2.9*   No results for input(s): "LIPASE", "AMYLASE" in the last 168 hours. No results for input(s): "AMMONIA" in the last 168 hours.  Coagulation Profile: No results for input(s): "INR", "PROTIME" in the last 168 hours. Cardiac Enzymes: No results for input(s): "CKTOTAL", "CKMB", "CKMBINDEX", "TROPONINI" in the last 168 hours. BNP (last 3 results) No results for input(s): "PROBNP" in the last 8760 hours. HbA1C: No results for input(s): "HGBA1C" in the last 72 hours. CBG: Recent Labs  Lab 10/12/22 1153 10/12/22 2346 10/13/22 0526 10/13/22 0601  GLUCAP 82 98 66* 80     Lipid Profile: No results for input(s): "CHOL", "HDL", "LDLCALC", "TRIG", "CHOLHDL", "LDLDIRECT" in the last 72 hours. Thyroid Function  Tests: No results for input(s): "TSH", "T4TOTAL", "FREET4", "T3FREE", "THYROIDAB" in the last 72 hours. Anemia Panel: No results for input(s): "VITAMINB12", "FOLATE", "FERRITIN", "TIBC", "IRON", "RETICCTPCT" in the last 72 hours. Sepsis Labs: No results for input(s): "PROCALCITON", "LATICACIDVEN" in the last 168 hours.  No results found for this or any previous visit (from the past 240 hour(s)).       Radiology Studies: No results found.      Scheduled Meds:  acetaminophen  500 mg Oral BID   divalproex  500 mg Oral QHS   divalproex  500 mg Oral Q12H   enoxaparin (LOVENOX) injection  40 mg Subcutaneous Q24H   folic acid  1 mg Oral Daily   megestrol  200 mg Oral BID   nystatin   Topical BID   mouth rinse  15 mL Mouth Rinse 4 times per day   pantoprazole  40 mg Oral Daily   polyethylene glycol  17 g Oral BID   QUEtiapine  400 mg Oral BID   Continuous Infusions:     LOS: 37 days    Time spent: 35 minutes    Deakon Frix Hoover Brunette, DO [facility]  If 7PM-7AM, please contact night-coverage [email or web address] 10/13/2022, 10:35 AM

## 2022-10-13 NOTE — Progress Notes (Signed)
Patient ate about 50% of breakfast and drank his tea.

## 2022-10-13 NOTE — Progress Notes (Signed)
Patient CBG at 0530 is 66.  Administered soda and applesauce. Rechecked CBG is 80. No s/sx of hypoglycemia noted.  Pt appears to be in no distress.  Notified Dr. Josephine Cables.

## 2022-10-14 DIAGNOSIS — F03918 Unspecified dementia, unspecified severity, with other behavioral disturbance: Secondary | ICD-10-CM | POA: Diagnosis not present

## 2022-10-14 LAB — GLUCOSE, CAPILLARY
Glucose-Capillary: 88 mg/dL (ref 70–99)
Glucose-Capillary: 91 mg/dL (ref 70–99)
Glucose-Capillary: 98 mg/dL (ref 70–99)

## 2022-10-14 MED ORDER — DIVALPROEX SODIUM 125 MG PO CSDR
500.0000 mg | DELAYED_RELEASE_CAPSULE | Freq: Three times a day (TID) | ORAL | Status: DC
  Administered 2022-10-14 – 2022-10-15 (×3): 500 mg via ORAL
  Filled 2022-10-14 (×3): qty 4

## 2022-10-14 NOTE — Progress Notes (Signed)
PROGRESS NOTE    [name]  EVO:[id number] DOB: June 08, 1961 DOA: 09/05/2022 PCP: [facility]   Brief Narrative:    [name] is a 62 yo male with PMH dementia, bipolar 1 disorder, HLD, HTN, CAD, CVA who presented with confusion and dysphagia. With further workup he was also found to be agitated/impulsive, and unable to be redirected.  He required Haldol and Ativan.  His significant other was concerned about his inability to swallow and he was brought for further workup. Because of the difficulty eating/dysphagia, he was initially initiated on a stroke workup.  However, further workup also was notable for an elevated lithium level.  Given his presenting symptoms, it was felt that they were attributed to lithium toxicity.  Poison control was called and patient was started on fluids and admitted. Despite aggressive measures, he showed little improvement during hospitalization.  Given his poor physical reserve and declining functional status leading up to hospitalization, he appeared to be more consistent with failure to thrive.  Goals of care discussions were held with his significant other who is his main caretaker and decision maker.  He was made DNR and continued on medical treatment in the hopes for some improvement however his prognosis was made clear that it appears to be worsening daily.  Palliative continues to have ongoing discussions with the girlfriend and ethics is involved.    09/18/22 -Had face-to-face Conference with patient's girlfriend on 09/13/2022 -Patient girlfriend requested that I do Not call her to talk to her about patient's poor prognosis anymore, because it makes her sad.- -Patient's girlfriend is accusing the medical team of "prematurely giving up on patient" --She tells me that she is not ready to make any decisions about possible de-escalation of care until the middle of January at the earliest -oral intake remains very  poor -Hypoglycemia with blood sugar of 69 noted despite continuous IV dextrose drip-- -will increase IV dextrose rate -Overall prognosis remains poor given very poor oral intake due to persistent metabolic encephalopathy superimposed on advanced dementia   09/19/22 -Remain calm comfortable, remained calm, comfortable, sleepy -Refused to eat or to be fed   09/20/2022 -Agitated confused overnight, Ativan and Haldol was used overnight -Appreciate palliative care following, and consulted ethics committee Current recommendation is neuroconsultation, attempt of Dobbhoff placement and initiation of tube feeding Fails we will proceed with comfort care     09/21/2022 -Some, minimal interaction between him and the nursing staff noted this morning with some oral intake -Plan remains for Dobbhoff placement per ethics committee recommendations Nutrition to be consulted for tube feeds -Psych has been consulted for evaluation -Pending neuro eval   09/22/2022 -Patient received Dobbhoff feeding tube placement by IR on 09/21/2022 -Nutrition has started tube feeding -Neurology has evaluated the patient-looking for reversible causes -Agitated overnight received Ativan around 3 AM Unresponsive non-interactive this a.m.   09/23/2022 -Patient was seen and examined -Agitated earlier per nursing staff -Dobbhoff tube still in place, tube feeds,     09/24/2022 -A bit more responsive to the nursing staff at bedside was encouraging oral intake -Drop-offs still present -tolerating tube feeds     09/25/2022 -Opens his eyes but does not follow command, nonverbal, noncommunicating -Per nursing staff agitated earlier this morning, has had p.o. intake for first time today -Dobbhoff feeding tube still in place   09/26/22 -Patient is nonverbal and appears to have pulled out feeding tube on 1/9 -Appears to tolerating some oral intake -Trial of lactulose today due  to elevated ammonia levels -Depakote recently increased  by behavioral health -Trial of Megace to see if this helps to improve appetite -Continue folate repletion -Appears to be having more of a failure to thrive picture and is appropriate for hospice  09/27/22 -Calorie count initiated to help see if pt meeting caloric needs -As per Neurology note, patient is an appropriate candidate for hospice. Since next of kin cannot be contacted at this point, attempts will be made to transfer to facility for hospice care.   09/28/22-09/29/22 -DSS evaluating for emergency guardianship at which point patient can be discharged once a facility has been obtained.  09/30/22-10/01/22 Patient has obtained emergency guardianship and is now awaiting placement.  Still requires sitter at bedside, plan to start Seroquel to assist with behavioral disturbances.  10/02/22 Plan to increase Seroquel dosing to 50 mg twice daily due to ongoing agitation and ongoing need for sitter.  10/03/22 Increase Seroquel to 100 mg twice daily with ongoing agitation noted overnight.  10/04/22 Increase Seroquel to 150 mg twice daily and continue sitter.  10/05/22 Increase Seroquel to 200 mg twice daily and continue sitter.  TTS evaluation to see if there could be some assistance in helping manage his behavior.  Trying to avoid the need for sitter so placement can be pursued.  10/06/22: The patient was seen and examined at his bedside.  Does not answer questions.  Per sitter in the room he was communicating with her earlier.  Ate without any difficulties.  10/07/2022: More agitated today.  Requiring additional dose of IV Ativan.  10/08/2022: Restless.  Still needing one-to-one sitter.  10/09/2022: Patient still restless and needing one-to-one sitter.  Increase Seroquel to 400 mg twice daily.  Consider change to Risperdal if needed.  10/10/22-1/25: Patient seen by hospice nurse and not noted to be a candidate for hospice facility.  Will continue to try other facilities.  Still requiring sitter at  bedside. Plan to increase Depakote dose at bedtime.  10/12/22: Awaiting repeat TTS evaluation as patient continues to have ongoing agitation issues requiring Ativan.  Sitter at bedside.  10/13/22: TTS evaluation performed 1/26 with recommendations to check Depakote level which appeared to be subtherapeutic.  Increase Depakote dosing today.  Ativan ordered as needed.  Laboratory data within normal limits and sitter at bedside.  10/14/22: Patient noted to have episode of agitation overnight still requiring sitter at bedside.  Depakote level increased 1/27.  Recheck level in AM.  Assessment & Plan:   Principal Problem:   Severe/Advanced Dementia with behavioral disturbance (HCC) Active Problems:   Bipolar 1 disorder (HCC)   Acute metabolic encephalopathy   Dysphagia   FTT (failure to thrive) in adult  Assessment and Plan:   1)Lithium Toxicity Lithium discontinued. Psychiatry following.  Lithium level 1.51 on admission -- lithium level normalized and dropped -No change in mentation,-not interactive -Status post psych/TTS evaluation - appreciate tele-psych rec's; he has been taken off lithium and started on depakote; continue p.o. for now and if necessary,  transition to IV   - [name]: Cachectic, minimally interactive, poor p.o. intake - poor QOL with decline as evidenced by severe cachexia;  -  DNR -palliative following     2) Advanced dementia with significant cognitive and memory deficits-encephalopathic -progressive decline-in setting of dementia   -Neurology consulted: Reviewing and evaluating for reversible causes-unlikely change in mentation at this point -Psych/CTS evaluation, adjusting medication - unlikely to reverse his condition at this point   -patient significant other [name]  recalls  that the patient has had significant memory and cognitive decline over the last several years -Overall 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 -The last time he was able to put a couple words together in a phrase was apparently around June 2023 when he said that the garage where he used to do his Dealer work was on [address] -otherwise over the last 12 months patient may answer with one-word answers, significant cognitive decline -Patient lost over 40 pounds of weight due to poor oral intake -Recount initiated on 1/11     3)Goals of care, counseling/discussion - Long conversation held with [place] on 09/10/2022 and again on 09/13/2022, September 19, 2022   -Ongoing discussion with palliative care team -Ethic committee consulted, ongoing discussion For committee and Ms. Verner Chol -recommended trial of Dobbhoff feeding tube placement and tube feeding -      -No improvement-minimally interactive, impulsive,-continue to pull out IVs telemetry lines -As needed Ativan, Haldol -No PO nutritional intake -IV fluid limited as he is pulling the line out   -Palliative care team, and ethics committee consulted: Recommended: Attempt Dobbhoff tube and tube feeds if fails progress to comfort care Recommended Neuro consultation   - patient has been hospitalized for days at that point and he has shown no signs of improvement.  He remains minimally interactive and impulsive, constantly trying to climb out of bed.  He has pulled IV out as well as telemetry off repetitively.   -He also has had extremely minimal nutritional intake since admission.    -WE do not find an to be a good candidate for G- tube placement as he will inevitably pull this out as well and PEG tube placement also considered medically futile and inappropriate in context of his multiple comorbidities, poor quality of life, and progressive functional decline   - Discussed with Barbaraann Share that he does not appear to have the physical reserve  to overcome the events that have led up to this hospitalization.  He may also not survive this hospitalization.       - [name] agrees with DNR status at this time. She does wish for ongoing attempts/treatment to see if he improves, but should he continue to decline (which I expect), then a natural death will be allowed. He is currently not a rehab candidate unless were to show tremendous improvement and rather he appears more appropriate for inpatient vs residential hospice depending on course of events over the next few days -Overall prognosis remains poor/grave -Family contact--   Richardean Chimera [phone] -As per [name] .... No living family members.  -Palliative working on contacting Ms. Scales       4)Hypernatremia -resolved   5)Acute metabolic encephalopathy in setting of baseline advanced/severe dementia with significant cognitive and memory deficits at baseline - Etiology attributed to probable lithium toxicity - CT head unremarkable for acute findings.  Chronic left frontal lobe infarct appreciated -MRI brain also negative for acute stroke -Patient is reported to have some altered mentation at baseline as well; per girlfriend,  he is mostly nonverbal at baseline----please see #2 above -Continue sitter -Continue assist of nursing staff -Trial of lactulose for elevated ammonia level -Continue folate repletion -Depakote as adjusted per behavioral health   6)Dysphagia -Multifactorial including lithium toxicity, psychiatric disorder refusing  oral intake -First oral intake 09/25/2022   -Per ethic committee trial of Dobbhoff tube place and enteric feeding if it fails we will proceed with comfort care   - Recent EGD June 2023, cannot see results, but per GI had gastritis and Candida esophagitis - unfortunately no signs of improvement still - see GOC - evaluated by SLP - continue offering diet as able; poor candidate for NGT and PEG candidate; not appropriate for TPN (No  clear goals for long-term solution) -Pulled out Dobbhoff tube     Leukocytosis -likely reactive-resolved -Chest x-ray on 09/17/2022 without acute findings -Get blood cultures, check UA -Hold off on antibiotics   Resolved prolonged QT interval - Initial QTc was prolonged Repeated 12 EKG with QTc 429 on 10/07/2022 Avoid QTc prolonging agents   Hyperlipidemia -discontinued statin, risk outweighs the benefit for this patient   Bipolar 1 disorder (HCC) - Discontinued lithium, per psych recommendation on Cogentin, IV Depakote - haldol / ativan PRN   Contamination of blood culture-resolved as of 09/09/2022 - 1/4 bottles noted with GPC from 12/20 but apparently nothing seen on media per micro. Has remained afebrile with minimal leukocytosis (considered reactive from other processes) - Will Repeat blood culture if becomes febrile      DVT prophylaxis:Lovenox subcu daily. Code Status: Full Family Communication: Tried calling significant other with no response on multiple days Disposition Plan:  Status is: Inpatient Remains inpatient appropriate because: Need for ongoing close monitoring and IV medications.   Consultants:  Palliative Neurology Behavioral health Ethics TTS 1/19  Procedures:  None  Antimicrobials:  Anti-infectives (From admission, onward)    Start     Dose/Rate Route Frequency Ordered Stop   09/06/22 0900  voriconazole (VFEND) tablet 200 mg  Status:  Discontinued        200 mg Oral  Once 09/05/22 1941 09/07/22 0815       Objective: Vitals:   10/12/22 1900 10/13/22 0437 10/13/22 1900 10/13/22 1948  BP: 135/84 132/78 (!) 112/91 106/79  Pulse: 77 72 75 77  Resp: 18 18 17 16   Temp: 97.6 F (36.4 C) 97.8 F (36.6 C) 97.7 F (36.5 C) 97.7 F (36.5 C)  TempSrc: Axillary Axillary Axillary Axillary  SpO2:  98%  90%  Weight:      Height:        Intake/Output Summary (Last 24 hours) at 10/14/2022 1015 Last data filed at 10/14/2022 0936 Gross per 24 hour   Intake 490 ml  Output 1400 ml  Net -910 ml   Filed Weights   10/10/22 0500 10/11/22 0500 10/12/22 0459  Weight: 54.6 kg 52.6 kg 52.6 kg    Examination: No significant change from prior exam.  General exam: Frail appearing no acute distress.   Respiratory system: Clear to auscultation no wheezes or rales. Cardiovascular system: S1 & S2 heard, regular rate and rhythm Gastrointestinal system: Abdomen is soft  Central nervous system: Alert and minimally interactive. Refuses to participate Extremities: No edema Skin: No significant lesions noted    Data Reviewed: I have personally reviewed following labs and imaging studies  CBC: Recent Labs  Lab 10/08/22 0856 10/13/22 0523  WBC 4.8 3.3*  HGB 11.1* 11.8*  HCT 36.0* 37.8*  MCV 78.9* 77.6*  PLT 219 234   Basic Metabolic Panel: Recent Labs  Lab 10/08/22 0856 10/13/22 0523  NA 133* 139  K 4.0 4.4  CL 104 107  CO2 22 24  GLUCOSE 96 87  BUN 16 22  CREATININE 0.79 0.89  CALCIUM 8.6* 9.1  MG  --  2.0   GFR: Estimated Creatinine Clearance: 64 mL/min (by C-G formula based on SCr of 0.89 mg/dL). Liver Function Tests: No results for input(s): "AST", "ALT", "ALKPHOS", "BILITOT", "PROT", "ALBUMIN" in the last 168 hours.  No results for input(s): "LIPASE", "AMYLASE" in the last 168 hours. No results for input(s): "AMMONIA" in the last 168 hours.  Coagulation Profile: No results for input(s): "INR", "PROTIME" in the last 168 hours. Cardiac Enzymes: No results for input(s): "CKTOTAL", "CKMB", "CKMBINDEX", "TROPONINI" in the last 168 hours. BNP (last 3 results) No results for input(s): "PROBNP" in the last 8760 hours. HbA1C: No results for input(s): "HGBA1C" in the last 72 hours. CBG: Recent Labs  Lab 10/13/22 0526 10/13/22 0601 10/13/22 1620 10/14/22 0005 10/14/22 0502  GLUCAP 66* 80 95 91 88     Lipid Profile: No results for input(s): "CHOL", "HDL", "LDLCALC", "TRIG", "CHOLHDL", "LDLDIRECT" in the last 72  hours. Thyroid Function Tests: No results for input(s): "TSH", "T4TOTAL", "FREET4", "T3FREE", "THYROIDAB" in the last 72 hours. Anemia Panel: No results for input(s): "VITAMINB12", "FOLATE", "FERRITIN", "TIBC", "IRON", "RETICCTPCT" in the last 72 hours. Sepsis Labs: No results for input(s): "PROCALCITON", "LATICACIDVEN" in the last 168 hours.  No results found for this or any previous visit (from the past 240 hour(s)).       Radiology Studies: No results found.      Scheduled Meds:  acetaminophen  500 mg Oral BID   divalproex  500 mg Oral Q8H   enoxaparin (LOVENOX) injection  40 mg Subcutaneous Q24H   folic acid  1 mg Oral Daily   megestrol  200 mg Oral BID   nystatin   Topical BID   mouth rinse  15 mL Mouth Rinse 4 times per day   pantoprazole  40 mg Oral Daily   polyethylene glycol  17 g Oral BID   QUEtiapine  400 mg Oral BID   Continuous Infusions:     LOS: 38 days    Time spent: 35 minutes    Alysen Smylie Hoover Brunette, DO [facility]  If 7PM-7AM, please contact night-coverage [email or web address] 10/14/2022, 10:15 AM

## 2022-10-14 NOTE — Progress Notes (Signed)
Patient has been cooperative most of the day. No ativan needed. Sitter is at the bedside. Patient has eaten almost all of his meals and is resting now. Mitts are still in place.

## 2022-10-15 DIAGNOSIS — F03918 Unspecified dementia, unspecified severity, with other behavioral disturbance: Secondary | ICD-10-CM | POA: Diagnosis not present

## 2022-10-15 LAB — GLUCOSE, CAPILLARY: Glucose-Capillary: 121 mg/dL — ABNORMAL HIGH (ref 70–99)

## 2022-10-15 LAB — VALPROIC ACID LEVEL: Valproic Acid Lvl: 39 ug/mL — ABNORMAL LOW (ref 50.0–100.0)

## 2022-10-15 MED ORDER — DIVALPROEX SODIUM 125 MG PO CSDR
500.0000 mg | DELAYED_RELEASE_CAPSULE | Freq: Two times a day (BID) | ORAL | Status: DC
  Administered 2022-10-15 – 2022-10-17 (×4): 500 mg via ORAL
  Filled 2022-10-15 (×4): qty 4

## 2022-10-15 MED ORDER — DIVALPROEX SODIUM 125 MG PO CSDR
1000.0000 mg | DELAYED_RELEASE_CAPSULE | Freq: Every day | ORAL | Status: DC
  Administered 2022-10-15 – 2022-10-27 (×13): 1000 mg via ORAL
  Filled 2022-10-15 (×13): qty 8

## 2022-10-15 NOTE — Progress Notes (Signed)
PROGRESS NOTE    [name]  EVO:[id number] DOB: June 08, 1961 DOA: 09/05/2022 PCP: [facility]   Brief Narrative:    [name] is a 62 yo male with PMH dementia, bipolar 1 disorder, HLD, HTN, CAD, CVA who presented with confusion and dysphagia. With further workup he was also found to be agitated/impulsive, and unable to be redirected.  He required Haldol and Ativan.  His significant other was concerned about his inability to swallow and he was brought for further workup. Because of the difficulty eating/dysphagia, he was initially initiated on a stroke workup.  However, further workup also was notable for an elevated lithium level.  Given his presenting symptoms, it was felt that they were attributed to lithium toxicity.  Poison control was called and patient was started on fluids and admitted. Despite aggressive measures, he showed little improvement during hospitalization.  Given his Tyler physical reserve and declining functional status leading up to hospitalization, he appeared to be more consistent with failure to thrive.  Goals of care discussions were held with his significant other who is his main caretaker and decision maker.  He was made DNR and continued on medical treatment in the hopes for some improvement however his prognosis was made clear that it appears to be worsening daily.  Palliative continues to have ongoing discussions with the girlfriend and ethics is involved.    09/18/22 -Had face-to-face Conference with patient's girlfriend on 09/13/2022 -Patient girlfriend requested that I do Not call her to talk to her about patient's Tyler prognosis anymore, because it makes her sad.- -Patient's girlfriend is accusing the medical team of "prematurely giving up on patient" --She tells me that she is not ready to make any decisions about possible de-escalation of care until the middle of January at the earliest -oral [name] remains very  Tyler -Hypoglycemia with blood sugar of 69 noted despite continuous IV dextrose drip-- -will increase IV dextrose rate -Overall prognosis remains Tyler given very Tyler oral [name] due to persistent metabolic encephalopathy superimposed on advanced dementia   09/19/22 -Remain calm comfortable, remained calm, comfortable, sleepy -Refused to eat or to be fed   09/20/2022 -Agitated confused overnight, Ativan and Haldol was used overnight -Appreciate palliative care following, and consulted ethics committee Current recommendation is neuroconsultation, attempt of Dobbhoff placement and initiation of tube feeding Fails we will proceed with comfort care     09/21/2022 -Some, minimal interaction between him and the nursing staff noted this morning with some oral [name] -Plan remains for Dobbhoff placement per ethics committee recommendations Nutrition to be consulted for tube feeds -Psych has been consulted for evaluation -Pending neuro eval   09/22/2022 -Patient received Dobbhoff feeding tube placement by IR on 09/21/2022 -Nutrition has started tube feeding -Neurology has evaluated the patient-looking for reversible causes -Agitated overnight received Ativan around 3 AM Unresponsive non-[name] this a.m.   09/23/2022 -Patient was seen and examined -Agitated earlier per nursing staff -Dobbhoff tube still in place, tube feeds,     09/24/2022 -A bit more responsive to the nursing staff at bedside was encouraging oral [name] -Drop-offs still present -tolerating tube feeds     09/25/2022 -Opens his eyes but does not follow command, nonverbal, noncommunicating -Per nursing staff agitated earlier this morning, has had p.o. [name] for first time today -Dobbhoff feeding tube still in place   09/26/22 -Patient is nonverbal and appears to have pulled out feeding tube on 1/9 -Appears to tolerating some oral [name] -Trial of lactulose today due  to elevated ammonia levels -Depakote recently increased  by behavioral health -Trial of Megace to see if this helps to improve appetite -Continue folate repletion -Appears to be having more of a failure to thrive picture and is appropriate for hospice  09/27/22 -Calorie count initiated to help see if pt meeting caloric needs -As per Neurology note, patient is an appropriate candidate for hospice. Since next of kin cannot be contacted at this point, attempts will be made to transfer to facility for hospice care.   09/28/22-09/29/22 -DSS evaluating for emergency guardianship at which point patient can be discharged once a facility has been obtained.  09/30/22-10/01/22 Patient has obtained emergency guardianship and is now awaiting placement.  Still requires sitter at bedside, plan to start Seroquel to assist with behavioral disturbances.  10/02/22 Plan to increase Seroquel dosing to 50 mg twice daily due to ongoing agitation and ongoing need for sitter.  10/03/22 Increase Seroquel to 100 mg twice daily with ongoing agitation noted overnight.  10/04/22 Increase Seroquel to 150 mg twice daily and continue sitter.  10/05/22 Increase Seroquel to 200 mg twice daily and continue sitter.  TTS evaluation to see if there could be some assistance in helping manage his behavior.  Trying to avoid the need for sitter so placement can be pursued.  10/06/22: The patient was seen and examined at his bedside.  Does not answer questions.  Per sitter in the room he was communicating with her earlier.  Ate without any difficulties.  10/07/2022: More agitated today.  Requiring additional dose of IV Ativan.  10/08/2022: Restless.  Still needing one-to-one sitter.  10/09/2022: Patient still restless and needing one-to-one sitter.  Increase Seroquel to 400 mg twice daily.  Consider change to Risperdal if needed.  10/10/22-1/25: Patient seen by hospice nurse and not noted to be a candidate for hospice facility.  Will continue to try other facilities.  Still requiring sitter at  bedside. Plan to increase Depakote dose at bedtime.  10/12/22: Awaiting repeat TTS evaluation as patient continues to have ongoing agitation issues requiring Ativan.  Sitter at bedside.  10/13/22: TTS evaluation performed 1/26 with recommendations to check Depakote level which appeared to be subtherapeutic.  Increase Depakote dosing today.  Ativan ordered as needed.  Laboratory data within normal limits and sitter at bedside.  10/14/22: Patient noted to have episode of agitation overnight still requiring sitter at bedside.  Depakote level increased 1/27.  Recheck level in AM.  10/15/22: Patient continues to require sitter at bedside.  Depakote level still continues to remain low.  Increase nighttime dose to 1000 mg.  Recheck level in a couple days and monitor ongoing agitation/behavior.  Assessment & Plan:   Principal Problem:   Severe/Advanced Dementia with behavioral disturbance (HCC) Active Problems:   Bipolar 1 disorder (HCC)   Acute metabolic encephalopathy   Dysphagia   FTT (failure to thrive) in adult  Assessment and Plan:   1)Lithium Toxicity Lithium discontinued. Psychiatry following.  Lithium level 1.51 on admission -- lithium level normalized and dropped -No change in mentation,-not [name] -Status post psych/TTS evaluation - appreciate tele-psych rec's; he has been taken off lithium and started on depakote; continue p.o. for now and if necessary,  transition to IV   - [name]: [name], [name], [name] - Tyler QOL with decline as evidenced by severe cachexia;  -  DNR -palliative following     2) Advanced dementia with significant cognitive and memory deficits-encephalopathic -progressive decline-in setting of dementia   -Neurology consulted:  Reviewing and evaluating for reversible causes-unlikely change in mentation at this point -Psych/CTS evaluation, adjusting medication - unlikely to reverse his condition at this point   -patient  significant other [name]  recalls that the patient has had significant memory and cognitive decline over the last several years -Overall 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 -The last time he was able to put a couple words together in a phrase was apparently around June 2023 when he said that the garage where he used to do his Dealer work was on [address] -otherwise over the last 12 months patient may answer with one-word answers, significant cognitive decline -Patient lost over 40 pounds of weight due to Tyler oral [name] -Recount initiated on 1/11     3)Goals of care, counseling/discussion - Long conversation held with Ashkum on 09/10/2022 and again on 09/13/2022, September 19, 2022   -Ongoing discussion with palliative care team -Ethic committee consulted, ongoing discussion For committee and Ms. Verner Chol -recommended trial of Dobbhoff feeding tube placement and tube feeding -      -No improvement-[name], impulsive,-continue to pull out IVs telemetry lines -As needed Ativan, Haldol -No PO nutritional [name] -IV fluid limited as he is pulling the line out   -Palliative care team, and ethics committee consulted: Recommended: Attempt Dobbhoff tube and tube feeds if fails progress to comfort care Recommended Neuro consultation   - patient has been hospitalized for days at that point and he has shown no signs of improvement.  He remains [name] and impulsive, constantly trying to climb out of bed.  He has pulled IV out as well as telemetry off repetitively.   -He also has had extremely minimal nutritional [name] since admission.    -WE do not find an to be a good candidate for G- tube placement as he will inevitably pull this out as well and PEG tube placement also  considered medically futile and inappropriate in context of his multiple comorbidities, Tyler quality of life, and progressive functional decline   - Discussed with Barbaraann Share that he does not appear to have the physical reserve to overcome the events that have led up to this hospitalization.  He may also not survive this hospitalization.       - [name] agrees with DNR status at this time. She does wish for ongoing attempts/treatment to see if he improves, but should he continue to decline (which I expect), then a natural death will be allowed. He is currently not a rehab candidate unless were to show tremendous improvement and rather he appears more appropriate for inpatient vs residential hospice depending on course of events over the next few days -Overall prognosis remains Tyler/grave -Family contact--   Richardean Chimera [phone] -As per [name] .... No living family members.  -Palliative working on contacting Ms. Scales       4)Hypernatremia -resolved   5)Acute metabolic encephalopathy in setting of baseline advanced/severe dementia with significant cognitive and memory deficits at baseline - Etiology attributed to probable lithium toxicity - CT head unremarkable for acute findings.  Chronic left frontal lobe infarct appreciated -MRI brain also negative for acute stroke -Patient is reported to have some altered mentation at baseline as well; per girlfriend,  he is mostly nonverbal at baseline----please see #  2 above -Landscape architect -Continue assist of nursing staff -Trial of lactulose for elevated ammonia level -Continue folate repletion -Depakote as adjusted per behavioral health   6)Dysphagia -Multifactorial including lithium toxicity, psychiatric disorder refusing oral [name] -First oral [name] 09/25/2022   -Per ethic committee trial of Dobbhoff tube place and enteric feeding if it fails we will proceed with comfort care   - Recent EGD June 2023, cannot see results, but  per GI had gastritis and Candida esophagitis - unfortunately no signs of improvement still - see GOC - evaluated by SLP - continue offering diet as able; Tyler candidate for NGT and PEG candidate; not appropriate for TPN (No clear goals for long-term solution) -Pulled out Dobbhoff tube     Leukocytosis -likely reactive-resolved -Chest x-ray on 09/17/2022 without acute findings -Get blood cultures, check UA -Hold off on antibiotics   Resolved prolonged QT interval - Initial QTc was prolonged Repeated 12 EKG with QTc 429 on 10/07/2022 Avoid QTc prolonging agents   Hyperlipidemia -discontinued statin, risk outweighs the benefit for this patient   Bipolar 1 disorder ([place]) - Discontinued lithium, per psych recommendation on Cogentin, IV Depakote - haldol / ativan PRN   Contamination of blood culture-resolved as of 09/09/2022 - 1/4 bottles noted with GPC from 12/20 but apparently nothing seen on media per micro. Has remained afebrile with minimal leukocytosis (considered reactive from other processes) - Will Repeat blood culture if becomes febrile      DVT prophylaxis:Lovenox subcu daily. Code Status: Full Family Communication: Tried calling significant other with no response on multiple days Disposition Plan:  Status is: Inpatient Remains inpatient appropriate because: Need for ongoing close monitoring and IV medications.   Consultants:  Palliative Neurology Behavioral health Ethics TTS 1/19  Procedures:  None  Antimicrobials:  Anti-infectives (From admission, onward)    Start     Dose/Rate Route Frequency Ordered Stop   09/06/22 0900  voriconazole (VFEND) tablet 200 mg  Status:  Discontinued        200 mg Oral  Once 09/05/22 1941 09/07/22 0815       Objective: Vitals:   10/14/22 1500 10/14/22 1554 10/14/22 2046 10/15/22 0500  BP: (!) 165/92 (!) 165/92 129/71 (!) 120/108  Pulse: 89 88 67 82  Resp: 19 17 18    Temp: 97.7 F (36.5 C) 97.7 F (36.5 C) 98 F  (36.7 C)   TempSrc: Axillary Axillary Axillary   SpO2: 100% 100% 97%   Weight:      Height:        [name]/Output Summary (Last 24 hours) at 10/15/2022 0840 Last data filed at 10/15/2022 0300 Gross per 24 hour  [name] 970 ml  Output 1200 ml  Net -230 ml   Filed Weights   10/10/22 0500 10/11/22 0500 10/12/22 0459  Weight: 54.6 kg 52.6 kg 52.6 kg    Examination: No significant change from prior exam.  General exam: Frail appearing no acute distress.   Respiratory system: Clear to auscultation no wheezes or rales. Cardiovascular system: S1 & S2 heard, regular rate and rhythm Gastrointestinal system: Abdomen is soft  Central nervous system: Alert and [name]. Refuses to participate Extremities: No edema Skin: No significant lesions noted    Data Reviewed: I have personally reviewed following labs and imaging studies  CBC: Recent Labs  Lab 10/08/22 0856 10/13/22 0523  WBC 4.8 3.3*  HGB 11.1* 11.8*  HCT 36.0* 37.8*  MCV 78.9* 77.6*  PLT 219 [id number]   Basic Metabolic Panel: Recent Labs  Lab 10/08/22 0856 10/13/22 0523  NA 133* 139  K 4.0 4.4  CL 104 107  CO2 22 24  GLUCOSE 96 87  BUN 16 22  CREATININE 0.79 0.89  CALCIUM 8.6* 9.1  MG  --  2.0   GFR: Estimated Creatinine Clearance: 64 mL/min (by C-G formula based on SCr of 0.89 mg/dL). Liver Function Tests: No results for input(s): "AST", "ALT", "ALKPHOS", "BILITOT", "PROT", "ALBUMIN" in the last 168 hours.  No results for input(s): "LIPASE", "AMYLASE" in the last 168 hours. No results for input(s): "AMMONIA" in the last 168 hours.  Coagulation Profile: No results for input(s): "INR", "PROTIME" in the last 168 hours. Cardiac Enzymes: No results for input(s): "CKTOTAL", "CKMB", "CKMBINDEX", "TROPONINI" in the last 168 hours. BNP (last 3 results) No results for input(s): "PROBNP" in the last 8760 hours. HbA1C: No results for input(s): "HGBA1C" in the last 72 hours. CBG: Recent Labs  Lab  10/13/22 0601 10/13/22 1620 10/14/22 0005 10/14/22 0502 10/14/22 1202  GLUCAP 80 95 91 88 98     Lipid Profile: No results for input(s): "CHOL", "HDL", "LDLCALC", "TRIG", "CHOLHDL", "LDLDIRECT" in the last 72 hours. Thyroid Function Tests: No results for input(s): "TSH", "T4TOTAL", "FREET4", "T3FREE", "THYROIDAB" in the last 72 hours. Anemia Panel: No results for input(s): "VITAMINB12", "FOLATE", "FERRITIN", "TIBC", "IRON", "RETICCTPCT" in the last 72 hours. Sepsis Labs: No results for input(s): "PROCALCITON", "LATICACIDVEN" in the last 168 hours.  No results found for this or any previous visit (from the past 240 hour(s)).       Radiology Studies: No results found.      Scheduled Meds:  acetaminophen  500 mg Oral BID   divalproex  1,000 mg Oral QHS   divalproex  500 mg Oral Q12H   enoxaparin (LOVENOX) injection  40 mg Subcutaneous H47Q   folic acid  1 mg Oral Daily   megestrol  200 mg Oral BID   nystatin   Topical BID   mouth rinse  15 mL Mouth Rinse 4 times per day   pantoprazole  40 mg Oral Daily   polyethylene glycol  17 g Oral BID   QUEtiapine  400 mg Oral BID       LOS: 39 days    Time spent: 35 minutes    Ysabelle Goodroe Darleen Crocker, DO [facility]  If 7PM-7AM, please contact night-coverage [email or web address] 10/15/2022, 8:40 AM

## 2022-10-16 DIAGNOSIS — F03918 Unspecified dementia, unspecified severity, with other behavioral disturbance: Secondary | ICD-10-CM | POA: Diagnosis not present

## 2022-10-16 LAB — GLUCOSE, CAPILLARY
Glucose-Capillary: 85 mg/dL (ref 70–99)
Glucose-Capillary: 96 mg/dL (ref 70–99)

## 2022-10-16 NOTE — Progress Notes (Signed)
PROGRESS NOTE    [name]  EVO:[id number] DOB: June 08, 1961 DOA: 09/05/2022 PCP: [facility]   Brief Narrative:    [name] is a 62 yo male with PMH dementia, bipolar 1 disorder, HLD, HTN, CAD, CVA who presented with confusion and dysphagia. With further workup he was also found to be agitated/impulsive, and unable to be redirected.  He required Haldol and Ativan.  His significant other was concerned about his inability to swallow and he was brought for further workup. Because of the difficulty eating/dysphagia, he was initially initiated on a stroke workup.  However, further workup also was notable for an elevated lithium level.  Given his presenting symptoms, it was felt that they were attributed to lithium toxicity.  Poison control was called and patient was started on fluids and admitted. Despite aggressive measures, he showed little improvement during hospitalization.  Given his poor physical reserve and declining functional status leading up to hospitalization, he appeared to be more consistent with failure to thrive.  Goals of care discussions were held with his significant other who is his main caretaker and decision maker.  He was made DNR and continued on medical treatment in the hopes for some improvement however his prognosis was made clear that it appears to be worsening daily.  Palliative continues to have ongoing discussions with the girlfriend and ethics is involved.    09/18/22 -Had face-to-face Conference with patient's girlfriend on 09/13/2022 -Patient girlfriend requested that I do Not call her to talk to her about patient's poor prognosis anymore, because it makes her sad.- -Patient's girlfriend is accusing the medical team of "prematurely giving up on patient" --She tells me that she is not ready to make any decisions about possible de-escalation of care until the middle of January at the earliest -oral intake remains very  poor -Hypoglycemia with blood sugar of 69 noted despite continuous IV dextrose drip-- -will increase IV dextrose rate -Overall prognosis remains poor given very poor oral intake due to persistent metabolic encephalopathy superimposed on advanced dementia   09/19/22 -Remain calm comfortable, remained calm, comfortable, sleepy -Refused to eat or to be fed   09/20/2022 -Agitated confused overnight, Ativan and Haldol was used overnight -Appreciate palliative care following, and consulted ethics committee Current recommendation is neuroconsultation, attempt of Dobbhoff placement and initiation of tube feeding Fails we will proceed with comfort care     09/21/2022 -Some, minimal interaction between him and the nursing staff noted this morning with some oral intake -Plan remains for Dobbhoff placement per ethics committee recommendations Nutrition to be consulted for tube feeds -Psych has been consulted for evaluation -Pending neuro eval   09/22/2022 -Patient received Dobbhoff feeding tube placement by IR on 09/21/2022 -Nutrition has started tube feeding -Neurology has evaluated the patient-looking for reversible causes -Agitated overnight received Ativan around 3 AM Unresponsive non-interactive this a.m.   09/23/2022 -Patient was seen and examined -Agitated earlier per nursing staff -Dobbhoff tube still in place, tube feeds,     09/24/2022 -A bit more responsive to the nursing staff at bedside was encouraging oral intake -Drop-offs still present -tolerating tube feeds     09/25/2022 -Opens his eyes but does not follow command, nonverbal, noncommunicating -Per nursing staff agitated earlier this morning, has had p.o. intake for first time today -Dobbhoff feeding tube still in place   09/26/22 -Patient is nonverbal and appears to have pulled out feeding tube on 1/9 -Appears to tolerating some oral intake -Trial of lactulose today due  to elevated ammonia levels -Depakote recently increased  by behavioral health -Trial of Megace to see if this helps to improve appetite -Continue folate repletion -Appears to be having more of a failure to thrive picture and is appropriate for hospice  09/27/22 -Calorie count initiated to help see if pt meeting caloric needs -As per Neurology note, patient is an appropriate candidate for hospice. Since next of kin cannot be contacted at this point, attempts will be made to transfer to facility for hospice care.   09/28/22-09/29/22 -DSS evaluating for emergency guardianship at which point patient can be discharged once a facility has been obtained.  09/30/22-10/01/22 Patient has obtained emergency guardianship and is now awaiting placement.  Still requires sitter at bedside, plan to start Seroquel to assist with behavioral disturbances.  10/02/22-10/16/22: Seroquel dosing was gradually increased over time, however this was not helping.  TTS was contacted 1/19 and had made some recommendations to adjust his medications, however he still continued to have ongoing issues with behavioral disturbances requiring sitter at bedside.  TTS recontacted 1/26 with check on Depakote level which was noted to be subtherapeutic.  Currently working on increasing Depakote levels gradually to max dose of 3000 mg total daily due to max recommended dose of 60 mg/kg.  Thus far, he continues to require sitter.  Full legal guardianship by DSS cannot be obtained until court date of 2/7 at which point they could make the decision to make him full comfort care and sent to SNF with hospice.  In the meantime, trying to illuminate the need for sitter to see if transferring to SNF would be possible.  Follow Depakote levels as well as other labs in a.m. and increase as needed.  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 Psychiatry following.  Lithium level 1.51 on admission -- lithium level normalized and dropped -No change in mentation,-not interactive -Status post psych/TTS evaluation - appreciate tele-psych rec's; he has been taken off lithium and started on depakote; continue p.o. for now and if necessary,  transition to IV   - [name]: Cachectic, minimally interactive, poor p.o. intake - poor QOL with decline as evidenced by severe cachexia;  -  DNR -palliative following     2) Advanced dementia with significant cognitive and memory deficits-encephalopathic -progressive decline-in setting of dementia   -Neurology consulted: Reviewing and evaluating for reversible causes-unlikely change in mentation at this point -Psych/CTS evaluation, adjusting medication - unlikely to reverse his condition at this point   -patient significant other [name]  recalls that the patient has had significant memory and cognitive decline over the last several years -Overall 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 -The last time he was able to put a couple words together in a phrase was apparently around June 2023 when he said that the garage where he used to do his Dealer work was on [address] -otherwise over the last 12 months patient may answer with one-word answers, significant cognitive decline -Patient lost over 40 pounds of weight due to poor oral intake -  Recount initiated on 1/11     3)Goals of care, counseling/discussion - Long conversation held with [place] on 09/10/2022 and again on 09/13/2022, September 19, 2022   -Ongoing discussion with palliative care team -Ethic committee consulted, ongoing discussion For committee and Ms.  Verner Chol -recommended trial of Dobbhoff feeding tube placement and tube feeding -      -No improvement-minimally interactive, impulsive,-continue to pull out IVs telemetry lines -As needed Ativan, Haldol -No PO nutritional intake -IV fluid limited as he is pulling the line out   -Palliative care team, and ethics committee consulted: Recommended: Attempt Dobbhoff tube and tube feeds if fails progress to comfort care Recommended Neuro consultation   - patient has been hospitalized for days at that point and he has shown no signs of improvement.  He remains minimally interactive and impulsive, constantly trying to climb out of bed.  He has pulled IV out as well as telemetry off repetitively.   -He also has had extremely minimal nutritional intake since admission.    -WE do not find an to be a good candidate for G- tube placement as he will inevitably pull this out as well and PEG tube placement also considered medically futile and inappropriate in context of his multiple comorbidities, poor quality of life, and progressive functional decline   - Discussed with Barbaraann Share that he does not appear to have the physical reserve to overcome the events that have led up to this hospitalization.  He may also not survive this hospitalization.       - [name] agrees with DNR status at this time. She does wish for ongoing attempts/treatment to see if he improves, but should he continue to decline (which I expect), then a natural death will be allowed. He is currently not a rehab candidate unless were to show tremendous improvement and rather he appears more appropriate for inpatient vs residential hospice depending on course of events over the next few days -Overall prognosis remains poor/grave -Family contact--   Richardean Chimera [phone] -As per [name] .... No living family members.  -Palliative working on contacting Ms. Scales       4)Hypernatremia -resolved   5)Acute metabolic encephalopathy  in setting of baseline advanced/severe dementia with significant cognitive and memory deficits at baseline - Etiology attributed to probable lithium toxicity - CT head unremarkable for acute findings.  Chronic left frontal lobe infarct appreciated -MRI brain also negative for acute stroke -Patient is reported to have some altered mentation at baseline as well; per girlfriend,  he is mostly nonverbal at baseline----please see #2 above -Continue sitter -Continue assist of nursing staff -Trial of lactulose for elevated ammonia level -Continue folate repletion -Depakote as adjusted per behavioral health   6)Dysphagia -Multifactorial including lithium toxicity, psychiatric disorder refusing oral intake -First oral intake 09/25/2022   -Per ethic committee trial of Dobbhoff tube place and enteric feeding if it fails we will proceed with comfort care   - Recent EGD June 2023, cannot see results, but per GI had gastritis and Candida esophagitis - unfortunately no signs of improvement still - see GOC - evaluated by SLP - continue offering diet as able; poor candidate for NGT and PEG candidate; not appropriate for TPN (No clear goals for long-term solution) -Pulled out Dobbhoff tube     Leukocytosis -likely reactive-resolved -Chest x-ray on 09/17/2022 without acute findings -Get blood cultures, check UA -Hold off on antibiotics   Resolved prolonged QT interval - Initial QTc was prolonged Repeated 12 EKG with  QTc 429 on 10/07/2022 Avoid QTc prolonging agents   Hyperlipidemia -discontinued statin, risk outweighs the benefit for this patient   Bipolar 1 disorder ([place]) - Discontinued lithium, per psych recommendation on Cogentin, IV Depakote - haldol / ativan PRN   Contamination of blood culture-resolved as of 09/09/2022 - 1/4 bottles noted with GPC from 12/20 but apparently nothing seen on media per micro. Has remained afebrile with minimal leukocytosis (considered reactive from other  processes) - Will Repeat blood culture if becomes febrile      DVT prophylaxis:Lovenox subcu daily. Code Status: Full Family Communication: Tried calling significant other with no response on multiple days Disposition Plan:  Status is: Inpatient Remains inpatient appropriate because: Need for ongoing close monitoring and IV medications.   Consultants:  Palliative Neurology Behavioral health Ethics TTS 1/19  Procedures:  None  Antimicrobials:  Anti-infectives (From admission, onward)    Start     Dose/Rate Route Frequency Ordered Stop   09/06/22 0900  voriconazole (VFEND) tablet 200 mg  Status:  Discontinued        200 mg Oral  Once 09/05/22 1941 09/07/22 0815       Objective: Vitals:   10/15/22 0500 10/15/22 1320 10/15/22 2300 10/16/22 0443  BP: (!) 120/108 (!) 131/93 108/73 (!) 120/92  Pulse: 82 (!) 109 78   Resp:  18 20 17   Temp:  97.9 F (36.6 C) 98.5 F (36.9 C)   TempSrc:   Axillary   SpO2:  100% 100%   Weight:      Height:        Intake/Output Summary (Last 24 hours) at 10/16/2022 1032 Last data filed at 10/16/2022 0934 Gross per 24 hour  Intake 1200 ml  Output 1200 ml  Net 0 ml   Filed Weights   10/10/22 0500 10/11/22 0500 10/12/22 0459  Weight: 54.6 kg 52.6 kg 52.6 kg    Examination: No significant change from prior exam.  General exam: Frail appearing no acute distress.   Respiratory system: Clear to auscultation no wheezes or rales. Cardiovascular system: S1 & S2 heard, regular rate and rhythm Gastrointestinal system: Abdomen is soft  Central nervous system: Alert and minimally interactive. Refuses to participate Extremities: No edema Skin: No significant lesions noted    Data Reviewed: I have personally reviewed following labs and imaging studies  CBC: Recent Labs  Lab 10/13/22 0523  WBC 3.3*  HGB 11.8*  HCT 37.8*  MCV 77.6*  PLT 294   Basic Metabolic Panel: Recent Labs  Lab 10/13/22 0523  NA 139  K 4.4  CL 107  CO2  24  GLUCOSE 87  BUN 22  CREATININE 0.89  CALCIUM 9.1  MG 2.0   GFR: Estimated Creatinine Clearance: 64 mL/min (by C-G formula based on SCr of 0.89 mg/dL). Liver Function Tests: No results for input(s): "AST", "ALT", "ALKPHOS", "BILITOT", "PROT", "ALBUMIN" in the last 168 hours.  No results for input(s): "LIPASE", "AMYLASE" in the last 168 hours. No results for input(s): "AMMONIA" in the last 168 hours.  Coagulation Profile: No results for input(s): "INR", "PROTIME" in the last 168 hours. Cardiac Enzymes: No results for input(s): "CKTOTAL", "CKMB", "CKMBINDEX", "TROPONINI" in the last 168 hours. BNP (last 3 results) No results for input(s): "PROBNP" in the last 8760 hours. HbA1C: No results for input(s): "HGBA1C" in the last 72 hours. CBG: Recent Labs  Lab 10/14/22 0005 10/14/22 0502 10/14/22 1202 10/15/22 1132 10/16/22 0809  GLUCAP 91 88 98 121* 85     Lipid Profile:  No results for input(s): "CHOL", "HDL", "LDLCALC", "TRIG", "CHOLHDL", "LDLDIRECT" in the last 72 hours. Thyroid Function Tests: No results for input(s): "TSH", "T4TOTAL", "FREET4", "T3FREE", "THYROIDAB" in the last 72 hours. Anemia Panel: No results for input(s): "VITAMINB12", "FOLATE", "FERRITIN", "TIBC", "IRON", "RETICCTPCT" in the last 72 hours. Sepsis Labs: No results for input(s): "PROCALCITON", "LATICACIDVEN" in the last 168 hours.  No results found for this or any previous visit (from the past 240 hour(s)).       Radiology Studies: No results found.      Scheduled Meds:  acetaminophen  500 mg Oral BID   divalproex  1,000 mg Oral QHS   divalproex  500 mg Oral Q12H   enoxaparin (LOVENOX) injection  40 mg Subcutaneous I62V   folic acid  1 mg Oral Daily   megestrol  200 mg Oral BID   nystatin   Topical BID   mouth rinse  15 mL Mouth Rinse 4 times per day   pantoprazole  40 mg Oral Daily   polyethylene glycol  17 g Oral BID   QUEtiapine  400 mg Oral BID       LOS: 40 days     Time spent: 35 minutes    Notnamed Scholz Darleen Crocker, DO [facility]  If 7PM-7AM, please contact night-coverage [email or web address] 10/16/2022, 10:32 AM

## 2022-10-16 NOTE — TOC Progression Note (Signed)
Transition of Care [facility]) - Progression Note    Patient Details  Name: [name] MRN: [id number] Date of Birth: Jan 16, 1961  [facility] CM/SW Contact  Boneta Lucks, RN Phone Number: 10/16/2022, 2:22 PM  Clinical Narrative:   TOC following, continued discussion with [facility]. Girlfriend does not want to talk about poor prognosis.    Full legal guardianship by DSS cannot be obtained until court date of 2/7 at which point they could make the decision to make him full comfort care and sent to SNF with hospice    Expected Discharge Plan: [place] Barriers to Discharge: Continued Medical Work up  Expected Discharge Plan and Services In-house Referral: Clinical Social Work   Post Acute Care Choice: [place] Living arrangements for the past 2 months: [place]                   Social Determinants of Health (SDOH) Interventions SDOH Screenings   Tobacco Use: Medium Risk (09/06/2022)    Readmission Risk Interventions    09/07/2022   12:35 PM  Readmission Risk Prevention Plan  Medication Screening Complete  Transportation Screening Complete

## 2022-10-17 DIAGNOSIS — R1310 Dysphagia, unspecified: Secondary | ICD-10-CM | POA: Diagnosis not present

## 2022-10-17 DIAGNOSIS — F319 Bipolar disorder, unspecified: Secondary | ICD-10-CM | POA: Diagnosis not present

## 2022-10-17 DIAGNOSIS — G9341 Metabolic encephalopathy: Secondary | ICD-10-CM | POA: Diagnosis not present

## 2022-10-17 DIAGNOSIS — F03918 Unspecified dementia, unspecified severity, with other behavioral disturbance: Secondary | ICD-10-CM | POA: Diagnosis not present

## 2022-10-17 LAB — BASIC METABOLIC PANEL
Anion gap: 7 (ref 5–15)
BUN: 24 mg/dL — ABNORMAL HIGH (ref 8–23)
CO2: 26 mmol/L (ref 22–32)
Calcium: 9.2 mg/dL (ref 8.9–10.3)
Chloride: 106 mmol/L (ref 98–111)
Creatinine, Ser: 0.88 mg/dL (ref 0.61–1.24)
GFR, Estimated: >60 mL/min (ref >60)
Glucose, Bld: 89 mg/dL (ref 70–99)
Potassium: 4.2 mmol/L (ref 3.5–5.1)
Sodium: 139 mmol/L (ref 135–145)

## 2022-10-17 LAB — GLUCOSE, CAPILLARY: Glucose-Capillary: 89 mg/dL (ref 70–99)

## 2022-10-17 LAB — CBC
HCT: 40.2 % (ref 39.0–52.0)
Hemoglobin: 12.5 g/dL — ABNORMAL LOW (ref 13.0–17.0)
MCH: 23.9 pg — ABNORMAL LOW (ref 26.0–34.0)
MCHC: 31.1 g/dL (ref 30.0–36.0)
MCV: 77 fL — ABNORMAL LOW (ref 80.0–100.0)
Platelets: 222 10*3/uL (ref 150–400)
RBC: 5.22 MIL/uL (ref 4.22–5.81)
RDW: 15.3 % (ref 11.5–15.5)
WBC: 2.9 10*3/uL — ABNORMAL LOW (ref 4.0–10.5)
nRBC: 0 % (ref 0.0–0.2)

## 2022-10-17 LAB — VALPROIC ACID LEVEL: Valproic Acid Lvl: 39 ug/mL — ABNORMAL LOW (ref 50.0–100.0)

## 2022-10-17 LAB — MAGNESIUM: Magnesium: 2.1 mg/dL (ref 1.7–2.4)

## 2022-10-17 MED ORDER — DIVALPROEX SODIUM 125 MG PO CSDR
750.0000 mg | DELAYED_RELEASE_CAPSULE | Freq: Two times a day (BID) | ORAL | Status: DC
  Administered 2022-10-17 – 2022-10-28 (×21): 750 mg via ORAL
  Filled 2022-10-17 (×22): qty 6

## 2022-10-17 NOTE — Progress Notes (Signed)
PROGRESS NOTE    [name]  VOH:[id number] DOB: 29-Mar-1961 DOA: 09/05/2022 PCP: [facility]   Brief Narrative:    [name] is a 62 yo male with PMH dementia, bipolar 1 disorder, HLD, HTN, CAD, CVA who presented with confusion and dysphagia. With further workup he was also found to be agitated/impulsive, and unable to be redirected.  He required Haldol and Ativan.  His significant other was concerned about his inability to swallow and he was brought for further workup. Because of the difficulty eating/dysphagia, he was initially initiated on a stroke workup.  However, further workup also was notable for an elevated lithium level.  Given his presenting symptoms, it was felt that they were attributed to lithium toxicity.  Poison control was called and patient was started on fluids and admitted. Despite aggressive measures, he showed little improvement during hospitalization.  Given his poor physical reserve and declining functional status leading up to hospitalization, he appeared to be more consistent with failure to thrive.  Goals of care discussions were held with his significant other who is his main caretaker and decision maker.  He was made DNR and continued on medical treatment in the hopes for some improvement however his prognosis was made clear that it appears to be worsening daily.  Palliative continues to have ongoing discussions with the girlfriend and ethics is involved.    09/18/22 -Had face-to-face Conference with patient's girlfriend on 09/13/2022 -Patient girlfriend requested that I do Not call her to talk to her about patient's poor prognosis anymore, because it makes her sad.- -Patient's girlfriend is accusing the medical team of "prematurely giving up on patient" --She tells me that she is not ready to make any decisions about possible de-escalation of care until the middle of January at the earliest -oral intake remains very  poor -Hypoglycemia with blood sugar of 69 noted despite continuous IV dextrose drip-- -will increase IV dextrose rate -Overall prognosis remains poor given very poor oral intake due to persistent metabolic encephalopathy superimposed on advanced dementia   09/19/22 -Remain calm comfortable, remained calm, comfortable, sleepy -Refused to eat or to be fed   09/20/2022 -Agitated confused overnight, Ativan and Haldol was used overnight -Appreciate palliative care following, and consulted ethics committee Current recommendation is neuroconsultation, attempt of Dobbhoff placement and initiation of tube feeding Fails we will proceed with comfort care     09/21/2022 -Some, minimal interaction between him and the nursing staff noted this morning with some oral intake -Plan remains for Dobbhoff placement per ethics committee recommendations Nutrition to be consulted for tube feeds -Psych has been consulted for evaluation -Pending neuro eval   09/22/2022 -Patient received Dobbhoff feeding tube placement by IR on 09/21/2022 -Nutrition has started tube feeding -Neurology has evaluated the patient-looking for reversible causes -Agitated overnight received Ativan around 3 AM Unresponsive non-interactive this a.m.   09/23/2022 -Patient was seen and examined -Agitated earlier per nursing staff -Dobbhoff tube still in place, tube feeds,     09/24/2022 -A bit more responsive to the nursing staff at bedside was encouraging oral intake -Drop-offs still present -tolerating tube feeds     09/25/2022 -Opens his eyes but does not follow command, nonverbal, noncommunicating -Per nursing staff agitated earlier this morning, has had p.o. intake for first time today -Dobbhoff feeding tube still in place   09/26/22 -Patient is nonverbal and appears to have pulled out feeding tube on 1/9 -Appears to tolerating some oral intake -Trial of lactulose today due  to elevated ammonia levels -Depakote recently increased  by behavioral health -Trial of Megace to see if this helps to improve appetite -Continue folate repletion -Appears to be having more of a failure to thrive picture and is appropriate for hospice  09/27/22 -Calorie count initiated to help see if pt meeting caloric needs -As per Neurology note, patient is an appropriate candidate for hospice. Since next of kin cannot be contacted at this point, attempts will be made to transfer to facility for hospice care.   09/28/22-09/29/22 -DSS evaluating for emergency guardianship at which point patient can be discharged once a facility has been obtained.  09/30/22-10/01/22 Patient has obtained emergency guardianship and is now awaiting placement.  Still requires sitter at bedside, plan to start Seroquel to assist with behavioral disturbances.  10/02/22-10/16/22: Seroquel dosing was gradually increased over time, however this was not helping.  TTS was contacted 1/19 and had made some recommendations to adjust his medications, however he still continued to have ongoing issues with behavioral disturbances requiring sitter at bedside.  TTS recontacted 1/26 with check on Depakote level which was noted to be subtherapeutic.  Currently working on increasing Depakote levels gradually to max dose of 3000 mg total daily due to max recommended dose of 60 mg/kg.  Thus far, he continues to require sitter.  Full legal guardianship by DSS cannot be obtained until court date of 2/7 at which point they could make the decision to make him full comfort care and sent to SNF with hospice.  In the meantime, trying to illuminate the need for sitter to see if transferring to SNF would be possible.  Following Depakote levels.   Assessment & Plan:   Principal Problem:   Severe/Advanced Dementia with behavioral disturbance (HCC) Active Problems:   Bipolar 1 disorder (HCC)   Acute metabolic encephalopathy   Dysphagia   FTT (failure to thrive) in adult  Assessment and  Plan:   1)Lithium Toxicity - Treated Lithium discontinued. Psychiatry following.  Lithium level 1.51 on admission -lithium level normalized and trending down -No change in mentation,-not interactive -Status post psych/TTS evaluation - appreciate tele-psych rec's; he has been taken off lithium and started on depakote; continue p.o. for now and if necessary,  transition to IV   - [name]: Cachectic, minimally interactive, poor p.o. 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 Verner Chol -recommended trial  of Dobbhoff feeding tube placement and tube feeding   -No improvement-minimally interactive, impulsive,-continue to pull out IVs telemetry lines -As needed Ativan, Haldol -No PO nutritional intake -IV fluid limited as he is pulling the line out   -Palliative care team, and ethics committee consulted: Recommended: Attempt Dobbhoff tube and tube feeds if fails progress to comfort care Recommended Neuro consultation   - patient has been hospitalized for days at that point and he has shown no signs of improvement.  He remains minimally interactive and impulsive, constantly trying to climb out of bed.  He has pulled IV out as well as telemetry off repetitively.   -He also has had extremely minimal nutritional intake since admission.    -WE do not find an to be a good candidate for G- tube placement as he will inevitably pull this out as well and PEG tube placement also considered medically futile and inappropriate in context of his multiple comorbidities, poor quality of life, and progressive functional decline   - Discussed with Barbaraann Share that he does not appear to have the physical reserve to overcome the events that have led up to this hospitalization.  He may also not survive this hospitalization.       - [name] agrees with DNR status at this time. She does wish for ongoing attempts/treatment to see if he improves, but should he continue to decline (which I expect), then a natural death will be allowed. He is currently not a rehab candidate unless were to show tremendous improvement and rather he appears more appropriate for inpatient vs residential hospice depending on course of events over the next few days -Overall prognosis remains poor/grave -Family contact--   Richardean Chimera [phone] -As per [name] .... No living family members.  -Palliative working on contacting Ms. Scales     4)Hypernatremia -resolved   5)Acute metabolic encephalopathy in setting of baseline  advanced/severe dementia with significant cognitive and memory deficits at baseline - Etiology attributed to probable lithium toxicity - CT head unremarkable for acute findings.  Chronic left frontal lobe infarct appreciated -MRI brain also negative for acute stroke -Patient is reported to have some altered mentation at baseline as well; per girlfriend,  he is mostly nonverbal at baseline----please see #2 above -Continue sitter -Continue assist of nursing staff -Trial of lactulose for elevated ammonia level -Continue folate repletion -Depakote as adjusted per behavioral health   6)Dysphagia -Multifactorial including lithium toxicity, psychiatric disorder refusing oral intake -First oral intake 09/25/2022   -Per ethic committee trial of Dobbhoff tube place and enteric feeding if it fails we will proceed with comfort care   - Recent EGD June 2023, cannot see results, but per GI had gastritis and Candida esophagitis - unfortunately no signs of improvement still - see GOC - evaluated by SLP - continue offering diet as able; poor candidate for NGT and PEG candidate; not appropriate for TPN (No clear goals for long-term solution) -Pulled out Dobbhoff tube     Leukocytosis -likely reactive-resolved -Chest x-ray on 09/17/2022 without acute findings -Get blood cultures, check UA -Hold off on antibiotics   Resolved prolonged QT interval - Initial QTc was prolonged Repeated 12 EKG with QTc 429 on 10/07/2022 Avoid QTc prolonging agents   Hyperlipidemia -discontinued statin, risk  outweighs the benefit for this patient   Bipolar 1 disorder ([place]) - Discontinued lithium, per psych recommendation on Cogentin, IV Depakote - haldol / ativan PRN   Contamination of blood culture-resolved as of 09/09/2022 - 1/4 bottles noted with GPC from 12/20 but apparently nothing seen on media per micro. Has remained afebrile with minimal leukocytosis (considered reactive from other processes) - Will Repeat  blood culture if becomes febrile    DVT prophylaxis: enoxaparin Code Status: Full Family Communication: Tried calling significant other with no response on multiple days Disposition Plan: TBD Status is: Inpatient Remains inpatient appropriate because: Need for ongoing close monitoring and IV medications.   Consultants:  Palliative Neurology Behavioral health Ethics TTS 1/19  Procedures:  None  Antimicrobials:  Anti-infectives (From admission, onward)    Start     Dose/Rate Route Frequency Ordered Stop   09/06/22 0900  voriconazole (VFEND) tablet 200 mg  Status:  Discontinued        200 mg Oral  Once 09/05/22 1941 09/07/22 0815       Objective: Vitals:   10/16/22 0443 10/16/22 1434 10/16/22 2030 10/17/22 0608  BP: (!) 120/92 (!) (P) 86/54 126/85   Pulse:  (P) 83 74   Resp: 17 (P) 18 20   Temp:  (!) (P) 97.2 F (36.2 C) 98.1 F (36.7 C)   TempSrc:  (P) Axillary    SpO2:  (P) 100% 95%   Weight:    42.6 kg  Height:        Intake/Output Summary (Last 24 hours) at 10/17/2022 1122 Last data filed at 10/17/2022 1038 Gross per 24 hour  Intake 720 ml  Output 1601 ml  Net -881 ml   Filed Weights   10/11/22 0500 10/12/22 0459 10/17/22 0608  Weight: 52.6 kg 52.6 kg 42.6 kg    Examination:  General exam: Frail appearing no acute distress. Awake, alert cooperative. Pleasant.    Respiratory system: Clear to auscultation no wheezes or rales. Cardiovascular system: normal S1 & S2 heard, no M/R/G. Gastrointestinal system: Abdomen is soft, ND/NT, no HSM. Normal BS.  Central nervous system: no focal findings.  Extremities: No edema Skin: No significant lesions noted  Data Reviewed: I have personally reviewed following labs and imaging studies  CBC: Recent Labs  Lab 10/13/22 0523 10/17/22 0511  WBC 3.3* 2.9*  HGB 11.8* 12.5*  HCT 37.8* 40.2  MCV 77.6* 77.0*  PLT 234 440   Basic Metabolic Panel: Recent Labs  Lab 10/13/22 0523 10/17/22 0511  NA 139 139  K  4.4 4.2  CL 107 106  CO2 24 26  GLUCOSE 87 89  BUN 22 24*  CREATININE 0.89 0.88  CALCIUM 9.1 9.2  MG 2.0 2.1   GFR: Estimated Creatinine Clearance: 52.4 mL/min (by C-G formula based on SCr of 0.88 mg/dL). Liver Function Tests: No results for input(s): "AST", "ALT", "ALKPHOS", "BILITOT", "PROT", "ALBUMIN" in the last 168 hours.  No results for input(s): "LIPASE", "AMYLASE" in the last 168 hours. No results for input(s): "AMMONIA" in the last 168 hours.  Coagulation Profile: No results for input(s): "INR", "PROTIME" in the last 168 hours. Cardiac Enzymes: No results for input(s): "CKTOTAL", "CKMB", "CKMBINDEX", "TROPONINI" in the last 168 hours. BNP (last 3 results) No results for input(s): "PROBNP" in the last 8760 hours. HbA1C: No results for input(s): "HGBA1C" in the last 72 hours. CBG: Recent Labs  Lab 10/14/22 1202 10/15/22 1132 10/16/22 0809 10/16/22 1627 10/17/22 0518  GLUCAP 98 121* 85 96 89  Lipid Profile: No results for input(s): "CHOL", "HDL", "LDLCALC", "TRIG", "CHOLHDL", "LDLDIRECT" in the last 72 hours. Thyroid Function Tests: No results for input(s): "TSH", "T4TOTAL", "FREET4", "T3FREE", "THYROIDAB" in the last 72 hours. Anemia Panel: No results for input(s): "VITAMINB12", "FOLATE", "FERRITIN", "TIBC", "IRON", "RETICCTPCT" in the last 72 hours. Sepsis Labs: No results for input(s): "PROCALCITON", "LATICACIDVEN" in the last 168 hours.  No results found for this or any previous visit (from the past 240 hour(s)).   Radiology Studies: No results found.   Scheduled Meds:  acetaminophen  500 mg Oral BID   divalproex  1,000 mg Oral QHS   divalproex  500 mg Oral Q12H   enoxaparin (LOVENOX) injection  40 mg Subcutaneous Q24H   folic acid  1 mg Oral Daily   megestrol  200 mg Oral BID   nystatin   Topical BID   mouth rinse  15 mL Mouth Rinse 4 times per day   pantoprazole  40 mg Oral Daily   polyethylene glycol  17 g Oral BID   QUEtiapine  400 mg  Oral BID     LOS: 41 days    Time spent: 35 minutes  Lavonta Tillis, MD  [facility]  If 7PM-7AM, please contact night-coverage [email or web address] 10/17/2022, 11:22 AM

## 2022-10-17 NOTE — Progress Notes (Signed)
Nutrition Follow up  DOCUMENTATION CODES:   Severe malnutrition in context of chronic illness  underweight  INTERVENTION:   Dysphagia 2 diet with nectar thick liquids  Nectar thick milk with meals  Assist with feeding all meals   NUTRITION DIAGNOSIS:   Severe Malnutrition related to chronic illness, acute illness (acute metabolic encephalopathy in setting of dementia, dysphagia) as evidenced by energy intake < or equal to 50% for > or equal to 5 days, severe fat depletion, severe muscle depletion, energy intake < 75% for > or equal to 1 month.  - stable with improved oral intake  GOAL:  Patient will meet greater than or equal to 90% of their needs (if feasible given his confusion) - not met  MONITOR:  PO intake, Labs, TF tolerance, Weight trends  ASSESSMENT: Patient is a 62 yo male with hx of dementia, HTN, CVA who presented  with confusion and dysphagia. Acute metabolic encephalopathy.   Palliative following. Patient referred to Hospice at [facility]. Patient unable to proceed with transition to Hospice until after court date for Guardianship on 10/24/22.   Patient assisted with meals and sitter is bedside. Per recent nursing documentation completing 0-100% of meals. Talked with nursing at bedside. Patient eating 100% of meals with her.    Weights reviewed. Last weight 12/20-59.4 kg. Will request new weight for assessment. Current weight obtained 49.5 kg - severe loss of 10 kg (17%) < 1 month. Updated weight 1/31- 42.6 kg additional severe acute loss of 14%- question accuracy of weight considering his reported average meal intake.  Medications: reviewed.      Latest Ref Rng & Units 10/17/2022    5:11 AM 10/13/2022    5:23 AM 10/08/2022    8:56 AM  BMP  Glucose 70 - 99 mg/dL 89  87  96   BUN 8 - 23 mg/dL 24  22  16    Creatinine 0.61 - 1.24 mg/dL 0.88  0.89  0.79   Sodium 135 - 145 mmol/L 139  139  133   Potassium 3.5 - 5.1 mmol/L 4.2  4.4  4.0   Chloride 98 - 111 mmol/L  106  107  104   CO2 22 - 32 mmol/L 26  24  22    Calcium 8.9 - 10.3 mg/dL 9.2  9.1  8.6         Diet Order:   Diet Order             DIET DYS 2 Room service appropriate? Yes; Fluid consistency: Nectar Thick  Diet effective now                   EDUCATION NEEDS:  Not appropriate for education at this time  Skin:  Skin Assessment: Reviewed RN Assessment  Last BM:  1/29 large  Height:   Ht Readings from Last 1 Encounters:  10/11/22 5\' 8"  (1.727 m)    Weight:   Wt Readings from Last 1 Encounters:  10/17/22 42.6 kg    Ideal Body Weight:   70 kg  BMI:  Body mass index is 14.28 kg/m.  Estimated Nutritional Needs:   Kcal:  1900-2100  Protein:  100-105 gr  Fluid:  1800 ml daily  Colman Cater MS,RD,CSG,LDN Contact: Shea Evans

## 2022-10-17 NOTE — Progress Notes (Signed)
Pt bladder scanned due to no urination during this shift, >23ml shown. MD made aware.

## 2022-10-18 DIAGNOSIS — G9341 Metabolic encephalopathy: Secondary | ICD-10-CM | POA: Diagnosis not present

## 2022-10-18 DIAGNOSIS — R627 Adult failure to thrive: Secondary | ICD-10-CM | POA: Diagnosis not present

## 2022-10-18 DIAGNOSIS — F03918 Unspecified dementia, unspecified severity, with other behavioral disturbance: Secondary | ICD-10-CM | POA: Diagnosis not present

## 2022-10-18 NOTE — Progress Notes (Signed)
Palliative-   Chart reviewed- patient oral intake is improving.  He has been determined not to be eligible for inpatient hospice homes by two hospices.  Currently disposition is main issue. Palliative will sign off as goals of care have been established.   Mariana Kaufman, AGNP-C Palliative Medicine  No charge

## 2022-10-18 NOTE — TOC Progression Note (Signed)
Transition of Care [facility]) - Progression Note    Patient Details  Name: [name] MRN: [id number] Date of Birth: 1961/02/03  [facility] CM/SW Contact  Boneta Lucks, RN Phone Number: 10/18/2022, 11:23 AM  Clinical Narrative:   Agitated as usual needs that sitter a danger to self, He has had medication changes  RN gave  him 400 mg of Seroquel and he is picking at everything trying to get up. Legal Guardian schedule to be finalized 2/7. MD waiting or LG to make a care plan.   Expected Discharge Plan: [place] Barriers to Discharge: Continued Medical Work up  Expected Discharge Plan and Services In-house Referral: Clinical Social Work   Post Acute Care Choice: [place] Living arrangements for the past 2 months: [place]

## 2022-10-18 NOTE — Progress Notes (Signed)
PROGRESS NOTE    [name]  DDU:[id number] DOB: 1961-07-24 DOA: 09/05/2022 PCP: [facility]   Brief Narrative:    [name] is a 62 yo male with PMH dementia, bipolar 1 disorder, HLD, HTN, CAD, CVA who presented with confusion and dysphagia. With further workup he was also found to be agitated/impulsive, and unable to be redirected.  He required Haldol and Ativan.  His significant other was concerned about his inability to swallow and he was brought for further workup. Because of the difficulty eating/dysphagia, he was initially initiated on a stroke workup.  However, further workup also was notable for an elevated lithium level.  Given his presenting symptoms, it was felt that they were attributed to lithium toxicity.  Poison control was called and patient was started on fluids and admitted. Despite aggressive measures, he showed little improvement during hospitalization.  Given his poor physical reserve and declining functional status leading up to hospitalization, he appeared to be more consistent with failure to thrive.  Goals of care discussions were held with his significant other who is his main caretaker and decision maker.  He was made DNR and continued on medical treatment in the hopes for some improvement however his prognosis was made clear that it appears to be worsening daily.  Palliative continues to have ongoing discussions with the girlfriend and ethics is involved.    09/18/22 -Had face-to-face Conference with patient's girlfriend on 09/13/2022 -Patient girlfriend requested that I do Not call her to talk to her about patient's poor prognosis anymore, because it makes her sad.- -Patient's girlfriend is accusing the medical team of "prematurely giving up on patient" --She tells me that she is not ready to make any decisions about possible de-escalation of care until the middle of January at the earliest -oral intake remains very  poor -Hypoglycemia with blood sugar of 69 noted despite continuous IV dextrose drip-- -will increase IV dextrose rate -Overall prognosis remains poor given very poor oral intake due to persistent metabolic encephalopathy superimposed on advanced dementia   09/19/22 -Remain calm comfortable, remained calm, comfortable, sleepy -Refused to eat or to be fed   09/20/2022 -Agitated confused overnight, Ativan and Haldol was used overnight -Appreciate palliative care following, and consulted ethics committee Current recommendation is neuroconsultation, attempt of Dobbhoff placement and initiation of tube feeding Fails we will proceed with comfort care     09/21/2022 -Some, minimal interaction between him and the nursing staff noted this Tyler with some oral intake -Plan remains for Dobbhoff placement per ethics committee recommendations Nutrition to be consulted for tube feeds -Psych has been consulted for evaluation -Pending neuro eval   09/22/2022 -Patient received Dobbhoff feeding tube placement by IR on 09/21/2022 -Nutrition has started tube feeding -Neurology has evaluated the patient-looking for reversible causes -Agitated overnight received Ativan around 3 AM Unresponsive non-interactive this a.m.   09/23/2022 -Patient was seen and examined -Agitated earlier per nursing staff -Dobbhoff tube still in place, tube feeds,     09/24/2022 -A bit more responsive to the nursing staff at bedside was encouraging oral intake -Drop-offs still present -tolerating tube feeds     09/25/2022 -Opens his eyes but does not follow command, nonverbal, noncommunicating -Per nursing staff agitated earlier this Tyler, has had p.o. intake for first time today -Dobbhoff feeding tube still in place   09/26/22 -Patient is nonverbal and appears to have pulled out feeding tube on 1/9 -Appears to tolerating some oral intake -Trial of lactulose today due  to elevated ammonia levels -Depakote recently increased  by behavioral health -Trial of Megace to see if this helps to improve appetite -Continue folate repletion -Appears to be having more of a failure to thrive picture and is appropriate for hospice  09/27/22 -Calorie count initiated to help see if pt meeting caloric needs -As per Neurology note, patient is an appropriate candidate for hospice. Since next of kin cannot be contacted at this point, attempts will be made to transfer to facility for hospice care.   09/28/22-09/29/22 -DSS evaluating for emergency guardianship at which point patient can be discharged once a facility has been obtained.  09/30/22-10/01/22 Patient has obtained emergency guardianship and is now awaiting placement.  Still requires sitter at bedside, plan to start Seroquel to assist with behavioral disturbances.  10/02/22-10/16/22: Seroquel dosing was gradually increased over time, however this was not helping.  TTS was contacted 1/19 and had made some recommendations to adjust his medications, however he still continued to have ongoing issues with behavioral disturbances requiring sitter at bedside.  TTS recontacted 1/26 with check on Depakote level which was noted to be subtherapeutic.  Currently working on increasing Depakote levels gradually to max dose of 3000 mg total daily due to max recommended dose of 60 mg/kg.  Thus far, he continues to require sitter.  Full legal guardianship by DSS cannot be obtained until court date of 2/7 at which point they could make the decision to make him full comfort care and sent to SNF with hospice.  In the meantime, trying to illuminate the need for sitter to see if transferring to SNF would be possible.  Following Depakote levels.   2/1: stable, waiting on disposition, remains agitated requiring sitter at bedside   Assessment & Plan:   Principal Problem:   Severe/Advanced Dementia with behavioral disturbance (HCC) Active Problems:   Bipolar 1 disorder ([name])   Acute metabolic  encephalopathy   Dysphagia   FTT (failure to thrive) in adult  Assessment and Plan:   1)Lithium Toxicity - Treated Lithium discontinued. Psychiatry following.  Lithium level 1.51 on admission -lithium level normalized and trending down -No change in mentation,-not interactive -Status post psych/TTS evaluation - appreciate tele-psych rec's; he has been taken off lithium and started on depakote; continue p.o. for now and if necessary,  transition to IV   - [name]: Cachectic, minimally interactive, poor p.o. intake - poor QOL with decline as evidenced by severe cachexia;  -  DNR -palliative established goals of care and signed off 2/1     2) Advanced dementia with significant cognitive and memory deficits-encephalopathic -progressive decline-in setting of dementia   -Neurology consulted: Reviewing and evaluating for reversible causes-unlikely change in mentation at this point -Psych/CTS evaluation, adjusting medication - unlikely to reverse his condition at this point   -patient significant other [name]  recalls that the patient has had significant memory and cognitive decline over the last several years -Overall 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 -The last time he was able to put a couple words together in a phrase was apparently around June 2023 when he said that the garage where he used to do his Dealer work was on [address] -otherwise over the last 12 months patient may answer with one-word answers, significant cognitive decline -  Patient lost over 40 pounds of weight due to poor oral intake -Recount initiated on 1/11     3)Goals of care, counseling/discussion - Long conversation held with Trona on 09/10/2022 and again on 09/13/2022, September 19, 2022   -Ongoing discussion with  palliative care team -Ethic committee consulted, ongoing discussion For committee and Ms. Verner Chol -recommended trial of Dobbhoff feeding tube placement and tube feeding   -No improvement-minimally interactive, impulsive,-continue to pull out IVs telemetry lines -As needed Ativan, Haldol -No PO nutritional intake -IV fluid limited as he is pulling the line out   -Palliative care team, and ethics committee consulted: Recommended: Attempt Dobbhoff tube and tube feeds if fails progress to comfort care Recommended Neuro consultation   - patient has been hospitalized for days at that point and he has shown no signs of improvement.  He remains minimally interactive and impulsive, constantly trying to climb out of bed.  He has pulled IV out as well as telemetry off repetitively.   -He also has had extremely minimal nutritional intake since admission.    -WE do not find an to be a good candidate for G- tube placement as he will inevitably pull this out as well and PEG tube placement also considered medically futile and inappropriate in context of his multiple comorbidities, poor quality of life, and progressive functional decline   - Discussed with Barbaraann Share that he does not appear to have the physical reserve to overcome the events that have led up to this hospitalization.  He may also not survive this hospitalization.       - [name] agrees with DNR status at this time. She does wish for ongoing attempts/treatment to see if he improves, but should he continue to decline (which I expect), then a natural death will be allowed. He is currently not a rehab candidate unless were to show tremendous improvement and rather he appears more appropriate for inpatient vs residential hospice depending on course of events over the next few days -Overall prognosis remains poor/grave -Family contact--   Richardean Chimera [phone] -As per [name] .... No living family members.  -Palliative working on contacting  Ms. Scales     4)Hypernatremia -resolved   5)Acute metabolic encephalopathy in setting of baseline advanced/severe dementia with significant cognitive and memory deficits at baseline - Etiology attributed to probable lithium toxicity - CT head unremarkable for acute findings.  Chronic left frontal lobe infarct appreciated -MRI brain also negative for acute stroke -Patient is reported to have some altered mentation at baseline as well; per girlfriend,  he is mostly nonverbal at baseline----please see #2 above -Continue sitter -Continue assist of nursing staff -Trial of lactulose for elevated ammonia level -Continue folate repletion -Depakote as adjusted per behavioral health   6)Dysphagia -Multifactorial including lithium toxicity, psychiatric disorder refusing oral intake -First oral intake 09/25/2022   -Per ethic committee trial of Dobbhoff tube place and enteric feeding if it fails we will proceed with comfort care   - Recent EGD June 2023, cannot see results, but per GI had gastritis and Candida esophagitis - unfortunately no signs of improvement still - see GOC - evaluated by SLP - continue offering diet as able; poor candidate for NGT and PEG candidate; not appropriate for TPN (No clear goals for long-term solution) -Pulled out Dobbhoff tube     Leukocytosis -likely reactive-resolved -Chest x-ray on 09/17/2022 without acute findings -Get blood cultures, check UA -Hold off on antibiotics   Resolved prolonged QT interval - Initial QTc  was prolonged Repeated 12 EKG with QTc 429 on 10/07/2022 Avoid QTc prolonging agents   Hyperlipidemia -discontinued statin, risk outweighs the benefit for this patient   Bipolar 1 disorder (HCC) - Discontinued lithium, per psych recommendation on Cogentin, IV Depakote - haldol / ativan PRN   Contamination of blood culture-resolved as of 09/09/2022 - 1/4 bottles noted with GPC from 12/20 but apparently nothing seen on media per micro. Has  remained afebrile with minimal leukocytosis (considered reactive from other processes) - Will Repeat blood culture if becomes febrile    DVT prophylaxis: enoxaparin Code Status: Full Family Communication: Tried calling significant other with no response on multiple days Disposition Plan: TBD Status is: Inpatient Remains inpatient appropriate because: Need for ongoing close monitoring and IV medications.   Consultants:  Palliative Neurology Behavioral health Ethics TTS 1/19  Procedures:  None  Antimicrobials:  Anti-infectives (From admission, onward)    Start     Dose/Rate Route Frequency Ordered Stop   09/06/22 0900  voriconazole (VFEND) tablet 200 mg  Status:  Discontinued        200 mg Oral  Once 09/05/22 1941 09/07/22 0815       Objective: Vitals:   10/17/22 0608 10/17/22 1337 10/17/22 2300 10/18/22 0300  BP:  101/85 (!) 125/91   Pulse:  76 81   Resp:  18 18   Temp:  98.6 F (37 C) 97.7 F (36.5 C)   TempSrc:  Oral Oral   SpO2:  97% 100%   Weight: 42.6 kg   48 kg  Height:        Intake/Output Summary (Last 24 hours) at 10/18/2022 1257 Last data filed at 10/18/2022 0900 Gross per 24 hour  Intake 1440 ml  Output 600 ml  Net 840 ml   Filed Weights   10/12/22 0459 10/17/22 0608 10/18/22 0300  Weight: 52.6 kg 42.6 kg 48 kg    Examination:  General exam: Frail appearing no acute distress. Awake, alert cooperative. Pleasant.    Respiratory system: Clear to auscultation no wheezes or rales. Cardiovascular system: normal S1 & S2 heard, no M/R/G. Gastrointestinal system: Abdomen is soft, ND/NT, no HSM. Normal BS.  Central nervous system: no focal findings.  Extremities: No edema Skin: No significant lesions noted  Data Reviewed: I have personally reviewed following labs and imaging studies  CBC: Recent Labs  Lab 10/13/22 0523 10/17/22 0511  WBC 3.3* 2.9*  HGB 11.8* 12.5*  HCT 37.8* 40.2  MCV 77.6* 77.0*  PLT 234 222   Basic Metabolic  Panel: Recent Labs  Lab 10/13/22 0523 10/17/22 0511  NA 139 139  K 4.4 4.2  CL 107 106  CO2 24 26  GLUCOSE 87 89  BUN 22 24*  CREATININE 0.89 0.88  CALCIUM 9.1 9.2  MG 2.0 2.1   GFR: Estimated Creatinine Clearance: 59.1 mL/min (by C-G formula based on SCr of 0.88 mg/dL). Liver Function Tests: No results for input(s): "AST", "ALT", "ALKPHOS", "BILITOT", "PROT", "ALBUMIN" in the last 168 hours.  No results for input(s): "LIPASE", "AMYLASE" in the last 168 hours. No results for input(s): "AMMONIA" in the last 168 hours.  Coagulation Profile: No results for input(s): "INR", "PROTIME" in the last 168 hours. Cardiac Enzymes: No results for input(s): "CKTOTAL", "CKMB", "CKMBINDEX", "TROPONINI" in the last 168 hours. BNP (last 3 results) No results for input(s): "PROBNP" in the last 8760 hours. HbA1C: No results for input(s): "HGBA1C" in the last 72 hours. CBG: Recent Labs  Lab 10/14/22 1202 10/15/22 1132 10/16/22  0809 10/16/22 1627 10/17/22 0518  GLUCAP 98 121* 85 96 89     Lipid Profile: No results for input(s): "CHOL", "HDL", "LDLCALC", "TRIG", "CHOLHDL", "LDLDIRECT" in the last 72 hours. Thyroid Function Tests: No results for input(s): "TSH", "T4TOTAL", "FREET4", "T3FREE", "THYROIDAB" in the last 72 hours. Anemia Panel: No results for input(s): "VITAMINB12", "FOLATE", "FERRITIN", "TIBC", "IRON", "RETICCTPCT" in the last 72 hours. Sepsis Labs: No results for input(s): "PROCALCITON", "LATICACIDVEN" in the last 168 hours.  No results found for this or any previous visit (from the past 240 hour(s)).   Radiology Studies: No results found.   Scheduled Meds:  acetaminophen  500 mg Oral BID   divalproex  1,000 mg Oral QHS   divalproex  750 mg Oral Q12H   enoxaparin (LOVENOX) injection  40 mg Subcutaneous Q24H   folic acid  1 mg Oral Daily   megestrol  200 mg Oral BID   nystatin   Topical BID   mouth rinse  15 mL Mouth Rinse 4 times per day   pantoprazole  40 mg  Oral Daily   polyethylene glycol  17 g Oral BID   QUEtiapine  400 mg Oral BID     LOS: 42 days   Time spent: 35 minutes  Kharma Sampsel, MD  [facility]  If 7PM-7AM, please contact night-coverage [email or web address] 10/18/2022, 12:57 PM

## 2022-10-18 NOTE — Progress Notes (Signed)
Pt climbing out of bed. Sitter attempting to redirect. This RN and sitter assisted patient back to bed. Pt was offered icecream. He agrees and is more calm. Bryson Corona Edd Fabian

## 2022-10-19 DIAGNOSIS — F03918 Unspecified dementia, unspecified severity, with other behavioral disturbance: Secondary | ICD-10-CM | POA: Diagnosis not present

## 2022-10-19 DIAGNOSIS — R627 Adult failure to thrive: Secondary | ICD-10-CM | POA: Diagnosis not present

## 2022-10-19 DIAGNOSIS — G9341 Metabolic encephalopathy: Secondary | ICD-10-CM | POA: Diagnosis not present

## 2022-10-19 NOTE — Progress Notes (Signed)
PROGRESS NOTE    [name]  EVO:[id number] DOB: June 08, 1961 DOA: 09/05/2022 PCP: [facility]   Brief Narrative:    [name] is a 62 yo male with PMH dementia, bipolar 1 disorder, HLD, HTN, CAD, CVA who presented with confusion and dysphagia. With further workup he was also found to be agitated/impulsive, and unable to be redirected.  He required Haldol and Ativan.  His significant other was concerned about his inability to swallow and he was brought for further workup. Because of the difficulty eating/dysphagia, he was initially initiated on a stroke workup.  However, further workup also was notable for an elevated lithium level.  Given his presenting symptoms, it was felt that they were attributed to lithium toxicity.  Poison control was called and patient was started on fluids and admitted. Despite aggressive measures, he showed little improvement during hospitalization.  Given his poor physical reserve and declining functional status leading up to hospitalization, he appeared to be more consistent with failure to thrive.  Goals of care discussions were held with his significant other who is his main caretaker and decision maker.  He was made DNR and continued on medical treatment in the hopes for some improvement however his prognosis was made clear that it appears to be worsening daily.  Palliative continues to have ongoing discussions with the girlfriend and ethics is involved.    09/18/22 -Had face-to-face Conference with patient's girlfriend on 09/13/2022 -Patient girlfriend requested that I do Not call her to talk to her about patient's poor prognosis anymore, because it makes her sad.- -Patient's girlfriend is accusing the medical team of "prematurely giving up on patient" --She tells me that she is not ready to make any decisions about possible de-escalation of care until the middle of January at the earliest -oral intake remains very  poor -Hypoglycemia with blood sugar of 69 noted despite continuous IV dextrose drip-- -will increase IV dextrose rate -Overall prognosis remains poor given very poor oral intake due to persistent metabolic encephalopathy superimposed on advanced dementia   09/19/22 -Remain calm comfortable, remained calm, comfortable, sleepy -Refused to eat or to be fed   09/20/2022 -Agitated confused overnight, Ativan and Haldol was used overnight -Appreciate palliative care following, and consulted ethics committee Current recommendation is neuroconsultation, attempt of Dobbhoff placement and initiation of tube feeding Fails we will proceed with comfort care     09/21/2022 -Some, minimal interaction between him and the nursing staff noted this morning with some oral intake -Plan remains for Dobbhoff placement per ethics committee recommendations Nutrition to be consulted for tube feeds -Psych has been consulted for evaluation -Pending neuro eval   09/22/2022 -Patient received Dobbhoff feeding tube placement by IR on 09/21/2022 -Nutrition has started tube feeding -Neurology has evaluated the patient-looking for reversible causes -Agitated overnight received Ativan around 3 AM Unresponsive non-interactive this a.m.   09/23/2022 -Patient was seen and examined -Agitated earlier per nursing staff -Dobbhoff tube still in place, tube feeds,     09/24/2022 -A bit more responsive to the nursing staff at bedside was encouraging oral intake -Drop-offs still present -tolerating tube feeds     09/25/2022 -Opens his eyes but does not follow command, nonverbal, noncommunicating -Per nursing staff agitated earlier this morning, has had p.o. intake for first time today -Dobbhoff feeding tube still in place   09/26/22 -Patient is nonverbal and appears to have pulled out feeding tube on 1/9 -Appears to tolerating some oral intake -Trial of lactulose today due  to elevated ammonia levels -Depakote recently increased  by behavioral health -Trial of Megace to see if this helps to improve appetite -Continue folate repletion -Appears to be having more of a failure to thrive picture and is appropriate for hospice  09/27/22 -Calorie count initiated to help see if pt meeting caloric needs -As per Neurology note, patient is an appropriate candidate for hospice. Since next of kin cannot be contacted at this point, attempts will be made to transfer to facility for hospice care.   09/28/22-09/29/22 -DSS evaluating for emergency guardianship at which point patient can be discharged once a facility has been obtained.  09/30/22-10/01/22 Patient has obtained emergency guardianship and is now awaiting placement.  Still requires sitter at bedside, plan to start Seroquel to assist with behavioral disturbances.  10/02/22-10/16/22: Seroquel dosing was gradually increased over time, however this was not helping.  TTS was contacted 1/19 and had made some recommendations to adjust his medications, however he still continued to have ongoing issues with behavioral disturbances requiring sitter at bedside.  TTS recontacted 1/26 with check on Depakote level which was noted to be subtherapeutic.  Currently working on increasing Depakote levels gradually to max dose of 3000 mg total daily due to max recommended dose of 60 mg/kg.  Thus far, he continues to require sitter.  Full legal guardianship by DSS cannot be obtained until court date of 2/7 at which point they could make the decision to make him full comfort care and sent to SNF with hospice.  In the meantime, trying to illuminate the need for sitter to see if transferring to SNF would be possible.  Following Depakote levels.   2/1-2/2: stable, waiting on disposition  Assessment & Plan:   Principal Problem:   Severe/Advanced Dementia with behavioral disturbance ([place]) Active Problems:   Bipolar 1 disorder ([place])   Acute metabolic encephalopathy   Dysphagia   FTT (failure to thrive)  in adult  Assessment and Plan:   1)Lithium Toxicity - Treated Lithium discontinued. Psychiatry following.  Lithium level 1.51 on admission -lithium level normalized and trending down -No change in mentation,-not interactive -Status post psych/TTS evaluation - appreciate tele-psych rec's; he has been taken off lithium and started on depakote; continue p.o. for now and if necessary,  transition to IV   - [name]: Cachectic, minimally interactive, poor p.o. intake - poor QOL with decline as evidenced by severe cachexia;  -  DNR -palliative established goals of care and signed off 2/1     2) Advanced dementia with significant cognitive and memory deficits-encephalopathic -progressive decline-in setting of dementia   -Neurology consulted: Reviewing and evaluating for reversible causes-unlikely change in mentation at this point -Psych/CTS evaluation, adjusting medication - unlikely to reverse his condition at this point   -patient significant other [name]  recalls that the patient has had significant memory and cognitive decline over the last several years -Overall 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 -The last time he was able to put a couple words together in a phrase was apparently around June 2023 when he said that the garage where he used to do his Dealer work was on [address] -otherwise over the last 12 months patient may answer with one-word answers, significant cognitive decline -Patient lost over 40 pounds of weight  due to poor oral intake -Recount initiated on 1/11     3)Goals of care, counseling/discussion - Long conversation held with [place] on 09/10/2022 and again on 09/13/2022, September 19, 2022   -Ongoing discussion with palliative care team -Ethic committee consulted,  ongoing discussion For committee and Ms. Verner Chol -recommended trial of Dobbhoff feeding tube placement and tube feeding   -No improvement-minimally interactive, impulsive,-continue to pull out IVs telemetry lines -As needed Ativan, Haldol -No PO nutritional intake -IV fluid limited as he is pulling the line out   -Palliative care team, and ethics committee consulted: Recommended: Attempt Dobbhoff tube and tube feeds if fails progress to comfort care Recommended Neuro consultation   - patient has been hospitalized for days at that point and he has shown no signs of improvement.  He remains minimally interactive and impulsive, constantly trying to climb out of bed.  He has pulled IV out as well as telemetry off repetitively.   -He also has had extremely minimal nutritional intake since admission.    -WE do not find an to be a good candidate for G- tube placement as he will inevitably pull this out as well and PEG tube placement also considered medically futile and inappropriate in context of his multiple comorbidities, poor quality of life, and progressive functional decline   - Discussed with Barbaraann Share that he does not appear to have the physical reserve to overcome the events that have led up to this hospitalization.  He may also not survive this hospitalization.       - [name] agrees with DNR status at this time. She does wish for ongoing attempts/treatment to see if he improves, but should he continue to decline (which I expect), then a natural death will be allowed. He is currently not a rehab candidate unless were to show tremendous improvement and rather he appears more appropriate for inpatient vs residential hospice depending on course of events over the next few days -Overall prognosis remains poor/grave -Family contact--   Richardean Chimera [phone] -As per [name] .... No living family members.  -Palliative working on contacting Ms. Scales     4)Hypernatremia -resolved    5)Acute metabolic encephalopathy in setting of baseline advanced/severe dementia with significant cognitive and memory deficits at baseline - Etiology attributed to probable lithium toxicity - CT head unremarkable for acute findings.  Chronic left frontal lobe infarct appreciated -MRI brain also negative for acute stroke -Patient is reported to have some altered mentation at baseline as well; per girlfriend,  he is mostly nonverbal at baseline----please see #2 above -Continue sitter -Continue assist of nursing staff -Trial of lactulose for elevated ammonia level -Continue folate repletion -Depakote as adjusted per behavioral health   6)Dysphagia -Multifactorial including lithium toxicity, psychiatric disorder refusing oral intake -First oral intake 09/25/2022   -Per ethic committee trial of Dobbhoff tube place and enteric feeding if it fails we will proceed with comfort care   - Recent EGD June 2023, cannot see results, but per GI had gastritis and Candida esophagitis - unfortunately no signs of improvement still - see GOC - evaluated by SLP - continue offering diet as able; poor candidate for NGT and PEG candidate; not appropriate for TPN (No clear goals for long-term solution) -Pulled out Dobbhoff tube     Leukocytosis -likely reactive-resolved -Chest x-ray on 09/17/2022 without acute findings -Get blood cultures, check UA -Hold off on antibiotics   Resolved prolonged QT interval - Initial QTc was prolonged Repeated 12 EKG with QTc  429 on 10/07/2022 Avoid QTc prolonging agents   Hyperlipidemia -discontinued statin, risk outweighs the benefit for this patient   Bipolar 1 disorder (HCC) - Discontinued lithium, per psych recommendation on Cogentin, IV Depakote - haldol / ativan PRN   Contamination of blood culture-resolved as of 09/09/2022 - 1/4 bottles noted with GPC from 12/20 but apparently nothing seen on media per micro. Has remained afebrile with minimal leukocytosis  (considered reactive from other processes) - Will Repeat blood culture if becomes febrile    DVT prophylaxis: enoxaparin Code Status: Full Family Communication: Tried calling significant other with no response on multiple days Disposition Plan: TBD Status is: Inpatient Remains inpatient appropriate because: Need for ongoing close monitoring and IV medications.   Consultants:  Palliative Neurology Behavioral health Ethics TTS 1/19  Procedures:  None  Antimicrobials:  Anti-infectives (From admission, onward)    Start     Dose/Rate Route Frequency Ordered Stop   09/06/22 0900  voriconazole (VFEND) tablet 200 mg  Status:  Discontinued        200 mg Oral  Once 09/05/22 1941 09/07/22 0815       Objective: Vitals:   10/18/22 0300 10/18/22 1327 10/19/22 0316 10/19/22 1300  BP:  (!) 127/91 (!) 140/90 114/80  Pulse:  (!) 110 (!) 101 88  Resp:  20  16  Temp:  97.9 F (36.6 C) 97.6 F (36.4 C) 98.6 F (37 C)  TempSrc:  Axillary Oral Oral  SpO2:    94%  Weight: 48 kg     Height:        Intake/Output Summary (Last 24 hours) at 10/19/2022 1430 Last data filed at 10/19/2022 0900 Gross per 24 hour  Intake 420 ml  Output 500 ml  Net -80 ml   Filed Weights   10/12/22 0459 10/17/22 0608 10/18/22 0300  Weight: 52.6 kg 42.6 kg 48 kg    Examination:  General exam: Frail appearing no acute distress. Awake, alert cooperative. Pleasant.    Respiratory system: Clear to auscultation no wheezes or rales. Cardiovascular system: normal S1 & S2 heard, no M/R/G. Gastrointestinal system: Abdomen is soft, ND/NT, no HSM. Normal BS.  Central nervous system: no focal findings.  Extremities: No edema Skin: No significant lesions noted  Data Reviewed: I have personally reviewed following labs and imaging studies  CBC: Recent Labs  Lab 10/13/22 0523 10/17/22 0511  WBC 3.3* 2.9*  HGB 11.8* 12.5*  HCT 37.8* 40.2  MCV 77.6* 77.0*  PLT 234 222   Basic Metabolic Panel: Recent Labs   Lab 10/13/22 0523 10/17/22 0511  NA 139 139  K 4.4 4.2  CL 107 106  CO2 24 26  GLUCOSE 87 89  BUN 22 24*  CREATININE 0.89 0.88  CALCIUM 9.1 9.2  MG 2.0 2.1   GFR: Estimated Creatinine Clearance: 59.1 mL/min (by C-G formula based on SCr of 0.88 mg/dL). Liver Function Tests: No results for input(s): "AST", "ALT", "ALKPHOS", "BILITOT", "PROT", "ALBUMIN" in the last 168 hours.  No results for input(s): "LIPASE", "AMYLASE" in the last 168 hours. No results for input(s): "AMMONIA" in the last 168 hours.  Coagulation Profile: No results for input(s): "INR", "PROTIME" in the last 168 hours. Cardiac Enzymes: No results for input(s): "CKTOTAL", "CKMB", "CKMBINDEX", "TROPONINI" in the last 168 hours. BNP (last 3 results) No results for input(s): "PROBNP" in the last 8760 hours. HbA1C: No results for input(s): "HGBA1C" in the last 72 hours. CBG: Recent Labs  Lab 10/14/22 1202 10/15/22 1132 10/16/22 0809 10/16/22  1627 10/17/22 0518  GLUCAP 98 121* 85 96 89     Lipid Profile: No results for input(s): "CHOL", "HDL", "LDLCALC", "TRIG", "CHOLHDL", "LDLDIRECT" in the last 72 hours. Thyroid Function Tests: No results for input(s): "TSH", "T4TOTAL", "FREET4", "T3FREE", "THYROIDAB" in the last 72 hours. Anemia Panel: No results for input(s): "VITAMINB12", "FOLATE", "FERRITIN", "TIBC", "IRON", "RETICCTPCT" in the last 72 hours. Sepsis Labs: No results for input(s): "PROCALCITON", "LATICACIDVEN" in the last 168 hours.  No results found for this or any previous visit (from the past 240 hour(s)).   Radiology Studies: No results found.   Scheduled Meds:  acetaminophen  500 mg Oral BID   divalproex  1,000 mg Oral QHS   divalproex  750 mg Oral Q12H   enoxaparin (LOVENOX) injection  40 mg Subcutaneous D74J   folic acid  1 mg Oral Daily   megestrol  200 mg Oral BID   nystatin   Topical BID   mouth rinse  15 mL Mouth Rinse 4 times per day   pantoprazole  40 mg Oral Daily    polyethylene glycol  17 g Oral BID   QUEtiapine  400 mg Oral BID     LOS: 43 days   Time spent: 35 minutes  Amamda Curbow, MD  [facility]  If 7PM-7AM, please contact night-coverage [email or web address] 10/19/2022, 2:30 PM

## 2022-10-19 NOTE — TOC Progression Note (Signed)
Transition of Care [facility]) - Progression Note    Patient Details  Name: HOSTEEN KIENAST MRN: [id number] Date of Birth: 28-Oct-1960  [facility]) CM/SW Contact  Salome Arnt, [place] Phone Number: 10/19/2022, 1:02 PM  Clinical Narrative:  Jackelyn Poling at [facility] is coming to evaluate pt this afternoon. TOC will follow up.     Expected Discharge Plan: [place] Barriers to Discharge: Continued Medical Work up  Expected Discharge Plan and Services In-house Referral: Clinical Social Work   Post Acute Care Choice: [place] Living arrangements for the past 2 months: [place] Determinants of Health (SDOH) Interventions SDOH Screenings   Tobacco Use: Medium Risk (09/06/2022)    Readmission Risk Interventions    09/07/2022   12:35 PM  Readmission Risk Prevention Plan  Medication Screening Complete  Transportation Screening Complete

## 2022-10-19 NOTE — Progress Notes (Signed)
Pt was agitated, attempting to get out of the bed, Ativan  2 mg IV  was given.

## 2022-10-20 DIAGNOSIS — R627 Adult failure to thrive: Secondary | ICD-10-CM | POA: Diagnosis not present

## 2022-10-20 DIAGNOSIS — F03918 Unspecified dementia, unspecified severity, with other behavioral disturbance: Secondary | ICD-10-CM | POA: Diagnosis not present

## 2022-10-20 DIAGNOSIS — G9341 Metabolic encephalopathy: Secondary | ICD-10-CM | POA: Diagnosis not present

## 2022-10-20 LAB — CBC
HCT: 36.4 % — ABNORMAL LOW (ref 39.0–52.0)
Hemoglobin: 11.8 g/dL — ABNORMAL LOW (ref 13.0–17.0)
MCH: 24.6 pg — ABNORMAL LOW (ref 26.0–34.0)
MCHC: 32.4 g/dL (ref 30.0–36.0)
MCV: 76 fL — ABNORMAL LOW (ref 80.0–100.0)
Platelets: 191 10*3/uL (ref 150–400)
RBC: 4.79 MIL/uL (ref 4.22–5.81)
RDW: 15.7 % — ABNORMAL HIGH (ref 11.5–15.5)
WBC: 3.5 10*3/uL — ABNORMAL LOW (ref 4.0–10.5)
nRBC: 0 % (ref 0.0–0.2)

## 2022-10-20 LAB — VALPROIC ACID LEVEL: Valproic Acid Lvl: 70 ug/mL (ref 50.0–100.0)

## 2022-10-20 MED ORDER — DIPHENHYDRAMINE HCL 50 MG/ML IJ SOLN
25.0000 mg | Freq: Once | INTRAMUSCULAR | Status: AC
  Administered 2022-10-20: 25 mg via INTRAVENOUS
  Filled 2022-10-20: qty 1

## 2022-10-20 NOTE — Progress Notes (Signed)
PROGRESS NOTE    [name]  EVO:[id number] DOB: June 08, 1961 DOA: 09/05/2022 PCP: [facility]   Brief Narrative:    [name] is a 62 yo male with PMH dementia, bipolar 1 disorder, HLD, HTN, CAD, CVA who presented with confusion and dysphagia. With further workup he was also found to be agitated/impulsive, and unable to be redirected.  He required Haldol and Ativan.  His significant other was concerned about his inability to swallow and he was brought for further workup. Because of the difficulty eating/dysphagia, he was initially initiated on a stroke workup.  However, further workup also was notable for an elevated lithium level.  Given his presenting symptoms, it was felt that they were attributed to lithium toxicity.  Poison control was called and patient was started on fluids and admitted. Despite aggressive measures, he showed little improvement during hospitalization.  Given his poor physical reserve and declining functional status leading up to hospitalization, he appeared to be more consistent with failure to thrive.  Goals of care discussions were held with his significant other who is his main caretaker and decision maker.  He was made DNR and continued on medical treatment in the hopes for some improvement however his prognosis was made clear that it appears to be worsening daily.  Palliative continues to have ongoing discussions with the girlfriend and ethics is involved.    09/18/22 -Had face-to-face Conference with patient's girlfriend on 09/13/2022 -Patient girlfriend requested that I do Not call her to talk to her about patient's poor prognosis anymore, because it makes her sad.- -Patient's girlfriend is accusing the medical team of "prematurely giving up on patient" --She tells me that she is not ready to make any decisions about possible de-escalation of care until the middle of January at the earliest -oral intake remains very  poor -Hypoglycemia with blood sugar of 69 noted despite continuous IV dextrose drip-- -will increase IV dextrose rate -Overall prognosis remains poor given very poor oral intake due to persistent metabolic encephalopathy superimposed on advanced dementia   09/19/22 -Remain calm comfortable, remained calm, comfortable, sleepy -Refused to eat or to be fed   09/20/2022 -Agitated confused overnight, Ativan and Haldol was used overnight -Appreciate palliative care following, and consulted ethics committee Current recommendation is neuroconsultation, attempt of Dobbhoff placement and initiation of tube feeding Fails we will proceed with comfort care     09/21/2022 -Some, minimal interaction between him and the nursing staff noted this morning with some oral intake -Plan remains for Dobbhoff placement per ethics committee recommendations Nutrition to be consulted for tube feeds -Psych has been consulted for evaluation -Pending neuro eval   09/22/2022 -Patient received Dobbhoff feeding tube placement by IR on 09/21/2022 -Nutrition has started tube feeding -Neurology has evaluated the patient-looking for reversible causes -Agitated overnight received Ativan around 3 AM Unresponsive non-interactive this a.m.   09/23/2022 -Patient was seen and examined -Agitated earlier per nursing staff -Dobbhoff tube still in place, tube feeds,     09/24/2022 -A bit more responsive to the nursing staff at bedside was encouraging oral intake -Drop-offs still present -tolerating tube feeds     09/25/2022 -Opens his eyes but does not follow command, nonverbal, noncommunicating -Per nursing staff agitated earlier this morning, has had p.o. intake for first time today -Dobbhoff feeding tube still in place   09/26/22 -Patient is nonverbal and appears to have pulled out feeding tube on 1/9 -Appears to tolerating some oral intake -Trial of lactulose today due  to elevated ammonia levels -Depakote recently increased  by behavioral health -Trial of Megace to see if this helps to improve appetite -Continue folate repletion -Appears to be having more of a failure to thrive picture and is appropriate for hospice  09/27/22 -Calorie count initiated to help see if pt meeting caloric needs -As per Neurology note, patient is an appropriate candidate for hospice. Since next of kin cannot be contacted at this point, attempts will be made to transfer to facility for hospice care.   09/28/22-09/29/22 -DSS evaluating for emergency guardianship at which point patient can be discharged once a facility has been obtained.  09/30/22-10/01/22 Patient has obtained emergency guardianship and is now awaiting placement.  Still requires sitter at bedside, plan to start Seroquel to assist with behavioral disturbances.  10/02/22-10/16/22: Seroquel dosing was gradually increased over time, however this was not helping.  TTS was contacted 1/19 and had made some recommendations to adjust his medications, however he still continued to have ongoing issues with behavioral disturbances requiring sitter at bedside.  TTS recontacted 1/26 with check on Depakote level which was noted to be subtherapeutic.  Currently working on increasing Depakote levels gradually to max dose of 3000 mg total daily due to max recommended dose of 60 mg/kg.  Thus far, he continues to require sitter.  Full legal guardianship by DSS cannot be obtained until court date of 2/7 at which point they could make the decision to make him full comfort care and sent to SNF with hospice.  In the meantime, trying to illuminate the need for sitter to see if transferring to SNF would be possible.  Following Depakote levels.   2/1-2/2: stable, waiting on disposition  Assessment & Plan:   Principal Problem:   Severe/Advanced Dementia with behavioral disturbance ([place]) Active Problems:   Bipolar 1 disorder ([place])   Acute metabolic encephalopathy   Dysphagia   FTT (failure to thrive)  in adult  Assessment and Plan:   1)Lithium Toxicity - Treated Lithium discontinued. Psychiatry following.  Lithium level 1.51 on admission -lithium level normalized and trending down -No change in mentation,-not interactive -Status post psych/TTS evaluation -appreciate tele-psych rec's; he has been taken off lithium and started on depakote; continue p.o. for now    - [name]: Cachectic, minimally interactive, poor p.o. intake - poor QOL with decline as evidenced by severe cachexia;  -  DNR -palliative established goals of care and signed off 2/1     2) Advanced dementia with significant cognitive and memory deficits-encephalopathic -progressive decline-in setting of dementia   -Neurology consulted: Reviewing and evaluating for reversible causes-unlikely change in mentation at this point -Psych/CTS evaluation, adjusting medication - unlikely to reverse his condition at this point   -patient significant other [name]  recalls that the patient has had significant memory and cognitive decline over the last several years -Overall 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  He went on disability at that time -His cognitive and memory decline had progressed over the last few years... -The last time he was able to put a couple words together in a phrase was apparently around June 2023 when he said that the garage where he used to do his Dealer work was on [address] -otherwise over the last 12 months patient may answer with one-word answers, significant cognitive decline -Patient lost over 40 pounds of weight due to poor oral intake -Recount initiated on  1/11     3)Goals of care, counseling/discussion - Long conversation held with [place] on 09/10/2022 and again on 09/13/2022, September 19, 2022   -Ongoing discussion with palliative care team -Ethic committee consulted, ongoing discussion For committee and Ms.  Verner Chol -recommended trial of Dobbhoff feeding tube placement and tube feeding   -No improvement-minimally interactive, impulsive,-continue to pull out IVs telemetry lines -As needed Ativan, Haldol -No PO nutritional intake -IV fluid limited as he is pulling the line out   -Palliative care team, and ethics committee consulted: Recommended: Attempt Dobbhoff tube and tube feeds if fails progress to comfort care Recommended Neuro consultation   - patient has been hospitalized for days at that point and he has shown no signs of improvement.  He remains minimally interactive and impulsive, constantly trying to climb out of bed.  He has pulled IV out as well as telemetry off repetitively.   -He also has had extremely minimal nutritional intake since admission.    -Medical staff do not find patient to be a good candidate for G- tube placement as he will inevitably pull this out as well and PEG tube placement also considered medically futile and inappropriate in context of his multiple comorbidities, poor quality of life, and progressive functional decline   - Discussed with Barbaraann Share that he does not appear to have the physical reserve to overcome the events that have led up to this hospitalization.  He may also not survive this hospitalization.       - [name] agrees with DNR status at this time. She does wish for ongoing attempts/treatment to see if he improves, but should he continue to decline (which I expect), then a natural death will be allowed. He is currently not a rehab candidate unless were to show tremendous improvement and rather he appears more appropriate for inpatient vs residential hospice depending on course of events over the next few days -Overall prognosis remains poor/grave -Family contact--   Richardean Chimera [phone] -As per [name] .... No living family members.  -Palliative working on contacting Ms. Scales     4)Hypernatremia -resolved   5)Acute metabolic  encephalopathy in setting of baseline advanced/severe dementia with significant cognitive and memory deficits at baseline - Etiology attributed to probable lithium toxicity - CT head unremarkable for acute findings.  Chronic left frontal lobe infarct appreciated -MRI brain also negative for acute stroke -Patient is reported to have some altered mentation at baseline as well; per girlfriend,  he is mostly nonverbal at baseline----please see #2 above -Continue sitter -Continue assist of nursing staff -Trial of lactulose for elevated ammonia level -Continue folate repletion -Depakote as adjusted per behavioral health   6)Dysphagia -Multifactorial including lithium toxicity, psychiatric disorder refusing oral intake -First oral intake 09/25/2022   -Per ethic committee trial of Dobbhoff tube place and enteric feeding if it fails we will proceed with comfort care   - Recent EGD June 2023, cannot see results, but per GI had gastritis and Candida esophagitis - unfortunately no signs of improvement still - see GOC - evaluated by SLP - continue offering diet as able; poor candidate for NGT and PEG candidate; not appropriate for TPN (No clear goals for long-term solution) -Pulled out Dobbhoff tube    Leukocytosis -likely reactive-resolved -Chest x-ray on 09/17/2022 without acute findings -Get blood cultures, check UA -Hold off on antibiotics   Resolved prolonged QT interval - Initial QTc was prolonged Repeated 12 EKG with QTc 429 on 10/07/2022 Avoid QTc prolonging agents  Hyperlipidemia -discontinued statin, risk outweighs the benefit for this patient   Bipolar 1 disorder (HCC) - Discontinued lithium, per psych recommendation on Cogentin, oral Depakote - haldol / ativan PRN - depakote level is therapeutic as of 2/2.    Contamination of blood culture-resolved as of 09/09/2022 - 1/4 bottles noted with GPC from 12/20 but apparently nothing seen on media per micro. Has remained afebrile with  minimal leukocytosis (considered reactive from other processes) - Will Repeat blood culture if becomes febrile    DVT prophylaxis: enoxaparin Code Status: Full Family Communication: Tried calling significant other with no response on multiple days Disposition Plan: TBD Status is: Inpatient Remains inpatient appropriate because: Need for ongoing close monitoring and IV medications.   Consultants:  Palliative Neurology Behavioral health Ethics TTS 1/19  Procedures:  None  Antimicrobials:  Anti-infectives (From admission, onward)    Start     Dose/Rate Route Frequency Ordered Stop   09/06/22 0900  voriconazole (VFEND) tablet 200 mg  Status:  Discontinued        200 mg Oral  Once 09/05/22 1941 09/07/22 0815       Objective: Vitals:   10/19/22 1300 10/19/22 2152 10/20/22 0531 10/20/22 0847  BP: 114/80 103/70 (!) 139/92 126/88  Pulse: 88 79 93 81  Resp: 16 18 18 17   Temp: 98.6 F (37 C) 98.4 F (36.9 C) 98.6 F (37 C) 98.3 F (36.8 C)  TempSrc: Oral Oral Oral Oral  SpO2: 94% 100%  100%  Weight:      Height:        Intake/Output Summary (Last 24 hours) at 10/20/2022 1234 Last data filed at 10/20/2022 0900 Gross per 24 hour  Intake 1574 ml  Output 1154 ml  Net 420 ml   Filed Weights   10/12/22 0459 10/17/22 0608 10/18/22 0300  Weight: 52.6 kg 42.6 kg 48 kg    Examination:  General exam: Frail appearing no acute distress. Awake, alert cooperative. Pleasant.    Respiratory system: Clear to auscultation no wheezes or rales. Cardiovascular system: normal S1 & S2 heard, no M/R/G. Gastrointestinal system: Abdomen is soft, ND/NT, no HSM. Normal BS.  Central nervous system: no focal findings.  Extremities: No edema Skin: No significant lesions noted  Data Reviewed: I have personally reviewed following labs and imaging studies  CBC: Recent Labs  Lab 10/17/22 0511 10/20/22 0613  WBC 2.9* 3.5*  HGB 12.5* 11.8*  HCT 40.2 36.4*  MCV 77.0* 76.0*  PLT 222 191    Basic Metabolic Panel: Recent Labs  Lab 10/17/22 0511  NA 139  K 4.2  CL 106  CO2 26  GLUCOSE 89  BUN 24*  CREATININE 0.88  CALCIUM 9.2  MG 2.1   GFR: Estimated Creatinine Clearance: 59.1 mL/min (by C-G formula based on SCr of 0.88 mg/dL). Liver Function Tests: No results for input(s): "AST", "ALT", "ALKPHOS", "BILITOT", "PROT", "ALBUMIN" in the last 168 hours.  No results for input(s): "LIPASE", "AMYLASE" in the last 168 hours. No results for input(s): "AMMONIA" in the last 168 hours.  Coagulation Profile: No results for input(s): "INR", "PROTIME" in the last 168 hours. Cardiac Enzymes: No results for input(s): "CKTOTAL", "CKMB", "CKMBINDEX", "TROPONINI" in the last 168 hours. BNP (last 3 results) No results for input(s): "PROBNP" in the last 8760 hours. HbA1C: No results for input(s): "HGBA1C" in the last 72 hours. CBG: Recent Labs  Lab 10/14/22 1202 10/15/22 1132 10/16/22 0809 10/16/22 1627 10/17/22 0518  GLUCAP 98 121* 85 96 89  Lipid Profile: No results for input(s): "CHOL", "HDL", "LDLCALC", "TRIG", "CHOLHDL", "LDLDIRECT" in the last 72 hours. Thyroid Function Tests: No results for input(s): "TSH", "T4TOTAL", "FREET4", "T3FREE", "THYROIDAB" in the last 72 hours. Anemia Panel: No results for input(s): "VITAMINB12", "FOLATE", "FERRITIN", "TIBC", "IRON", "RETICCTPCT" in the last 72 hours. Sepsis Labs: No results for input(s): "PROCALCITON", "LATICACIDVEN" in the last 168 hours.  No results found for this or any previous visit (from the past 240 hour(s)).   Radiology Studies: No results found.   Scheduled Meds:  acetaminophen  500 mg Oral BID   divalproex  1,000 mg Oral QHS   divalproex  750 mg Oral Q12H   enoxaparin (LOVENOX) injection  40 mg Subcutaneous K59X   folic acid  1 mg Oral Daily   megestrol  200 mg Oral BID   nystatin   Topical BID   mouth rinse  15 mL Mouth Rinse 4 times per day   pantoprazole  40 mg Oral Daily   polyethylene  glycol  17 g Oral BID   QUEtiapine  400 mg Oral BID     LOS: 44 days   Time spent: 35 minutes  Omega Durante, MD  [facility]  If 7PM-7AM, please contact night-coverage [email or web address] 10/20/2022, 12:34 PM

## 2022-10-20 NOTE — Plan of Care (Signed)
  Problem: Self-Care: Goal: Ability to participate in self-care as condition permits will improve Outcome: Progressing   Problem: Self-Care: Goal: Ability to communicate needs accurately will improve Outcome: Progressing   Problem: Nutrition: Goal: Dietary intake will improve Outcome: Progressing   Problem: Nutrition: Goal: Risk of aspiration will decrease Outcome: Progressing   Problem: Health Behavior/Discharge Planning: Goal: Ability to manage health-related needs will improve Outcome: Progressing   Problem: Coping: Goal: Level of anxiety will decrease Outcome: Progressing   Problem: Nutrition: Goal: Adequate nutrition will be maintained Outcome: Progressing   Problem: Activity: Goal: Risk for activity intolerance will decrease Outcome: Progressing   Problem: Pain Managment: Goal: General experience of comfort will improve Outcome: Progressing   Problem: Safety: Goal: Ability to remain free from injury will improve Outcome: Progressing   Problem: Skin Integrity: Goal: Risk for impaired skin integrity will decrease Outcome: Progressing

## 2022-10-20 NOTE — Progress Notes (Signed)
Pt attempting to get out of bed frequently. Pt not fully cooperative. Pt has had multiple incontinent BM's overnight. Pt was assisted to bathroom for 3rd BM of the night. Pt able to walk with 2 person assist.  Pt constantly scratching at arms, legs and face, attempting to get out of bed frequently. Lotion applied to very dry and flaky skin. Pt continues to scratch. MD contacted for benadryl.   Ativan previously given for persistent agitation unresolved with distraction/calm interaction and snacks. Pt enjoyed multiple Ice creams and applesauce this evening for comfort.

## 2022-10-21 DIAGNOSIS — R627 Adult failure to thrive: Secondary | ICD-10-CM | POA: Diagnosis not present

## 2022-10-21 DIAGNOSIS — F03918 Unspecified dementia, unspecified severity, with other behavioral disturbance: Secondary | ICD-10-CM | POA: Diagnosis not present

## 2022-10-21 DIAGNOSIS — G9341 Metabolic encephalopathy: Secondary | ICD-10-CM | POA: Diagnosis not present

## 2022-10-21 MED ORDER — LORAZEPAM 1 MG PO TABS
1.0000 mg | ORAL_TABLET | Freq: Two times a day (BID) | ORAL | Status: DC
  Administered 2022-10-21 – 2022-10-27 (×14): 1 mg via ORAL
  Filled 2022-10-21 (×14): qty 1

## 2022-10-21 MED ORDER — POLYETHYLENE GLYCOL 3350 17 G PO PACK: 17.0000 g | PACK | Freq: Every day | ORAL | Status: DC | PRN

## 2022-10-21 NOTE — Plan of Care (Signed)
  Problem: Self-Care: Goal: Ability to communicate needs accurately will improve Outcome: Progressing   Problem: Nutrition: Goal: Dietary intake will improve Outcome: Progressing   Problem: Activity: Goal: Risk for activity intolerance will decrease Outcome: Progressing   Problem: Nutrition: Goal: Adequate nutrition will be maintained Outcome: Progressing   Problem: Coping: Goal: Level of anxiety will decrease Outcome: Progressing   Problem: Pain Managment: Goal: General experience of comfort will improve Outcome: Progressing   Problem: Safety: Goal: Ability to remain free from injury will improve Outcome: Progressing   Problem: Skin Integrity: Goal: Risk for impaired skin integrity will decrease Outcome: Progressing

## 2022-10-21 NOTE — Progress Notes (Signed)
PROGRESS NOTE    [name]  WEX:[id number] DOB: 1960-10-18 DOA: 09/05/2022 PCP: [facility]   Brief Narrative:    [name] is a 62 yo male with PMH dementia, bipolar 1 disorder, HLD, HTN, CAD, CVA who presented with confusion and dysphagia.  With further workup he was also found to be agitated/impulsive, and unable to be redirected.  He required Haldol and Ativan.  His significant other was concerned about his inability to swallow and he was brought for further workup.  Because of the difficulty eating/dysphagia, he was initially initiated on a stroke workup.  However, further workup also was notable for an elevated lithium level.  Given his presenting symptoms, it was felt that they were attributed to lithium toxicity.  Poison control was called and patient was started on fluids and admitted.  Despite aggressive measures, he showed little improvement during hospitalization.  Given his poor physical reserve and declining functional status leading up to hospitalization, he appeared to be more consistent with failure to thrive.  Goals of care discussions were held with his significant other who is his main caretaker and decision maker.  He was made DNR and continued on medical treatment in the hopes for some improvement however his prognosis was made clear that it appears to be worsening daily.  Palliative continues to have ongoing discussions with the girlfriend and ethics is involved.    09/18/22 -Had face-to-face Conference with patient's girlfriend on 09/13/2022 -Patient girlfriend requested that I do Not call her to talk to her about patient's poor prognosis anymore, because it makes her sad.- -Patient's girlfriend is accusing the medical team of "prematurely giving up on patient" --She tells me that she is not ready to make any decisions about possible de-escalation of care until the middle of January at the earliest -oral intake remains very  poor -Hypoglycemia with blood sugar of 69 noted despite continuous IV dextrose drip-- -will increase IV dextrose rate -Overall prognosis remains poor given very poor oral intake due to persistent metabolic encephalopathy superimposed on advanced dementia   09/19/22 -Remain calm comfortable, remained calm, comfortable, sleepy -Refused to eat or to be fed   09/20/2022 -Agitated confused overnight, Ativan and Haldol was used overnight -Appreciate palliative care following, and consulted ethics committee Current recommendation is neuroconsultation, attempt of Dobbhoff placement and initiation of tube feeding Fails we will proceed with comfort care    09/21/2022 -Some, minimal interaction between him and the nursing staff noted this morning with some oral intake -Plan remains for Dobbhoff placement per ethics committee recommendations Nutrition to be consulted for tube feeds -Psych has been consulted for evaluation   09/22/2022 -Patient received Dobbhoff feeding tube placement by IR on 09/21/2022 -Nutrition has started tube feeding -Neurology has evaluated the patient-looking for reversible causes -Agitated overnight received Ativan around 3 AM Unresponsive non-interactive this a.m.   09/23/2022 -Patient was seen and examined -Agitated earlier per nursing staff -Dobbhoff tube still in place, tube feeds,     09/24/2022 -A bit more responsive to the nursing staff at bedside was encouraging oral intake -Drop-offs still present -tolerating tube feeds     09/25/2022 -Opens his eyes but does not follow command, nonverbal, noncommunicating -Per nursing staff agitated earlier this morning, has had p.o. intake for first time today -Dobbhoff feeding tube still in place   09/26/22 -Patient is nonverbal and appears to have pulled out feeding tube on 1/9 -Appears to tolerating some oral intake -Trial of lactulose today due to  elevated ammonia levels -Depakote recently increased by behavioral  health -Trial of Megace to see if this helps to improve appetite -Continue folate repletion -Appears to be having more of a failure to thrive picture and is appropriate for hospice  09/27/22 -Calorie count initiated to help see if pt meeting caloric needs -As per Neurology note, patient is an appropriate candidate for hospice. Since next of kin cannot be contacted at this point, attempts will be made to transfer to facility for hospice care.   09/28/22-09/29/22 -DSS evaluating for emergency guardianship at which point patient can be discharged once a facility has been obtained.  09/30/22-10/01/22 Patient has obtained emergency guardianship and is now awaiting placement.  Still requires sitter at bedside, plan to start Seroquel to assist with behavioral disturbances.  10/02/22-10/16/22: Seroquel dosing was gradually increased over time, however this was not helping.  TTS was contacted 1/19 and had made some recommendations to adjust his medications, however he still continued to have ongoing issues with behavioral disturbances requiring sitter at bedside.  TTS recontacted 1/26 with check on Depakote level which was noted to be subtherapeutic.  Currently working on increasing Depakote levels gradually to max dose of 3000 mg total daily due to max recommended dose of 60 mg/kg.  Thus far, he continues to require sitter.  Full legal guardianship by DSS cannot be obtained until court date of 2/7 at which point they could make the decision to make him full comfort care and sent to SNF with hospice.  In the meantime, trying to illuminate the need for sitter to see if transferring to SNF would be possible.  Following Depakote levels.   2/1-2/2: stable, waiting on disposition  Assessment & Plan:   Principal Problem:   Severe/Advanced Dementia with behavioral disturbance ([name]) Active Problems:   Bipolar 1 disorder ([name])   Acute metabolic encephalopathy   Dysphagia   FTT (failure to thrive) in  adult  Assessment and Plan:   1)Lithium Toxicity - Treated Lithium discontinued. Psychiatry following.  Lithium level 1.51 on admission -lithium level normalized and trending down -No change in mentation,-not interactive -Status post psych/TTS evaluation -appreciate tele-psych rec's; he has been taken off lithium and started on depakote; continue p.o. for now   - [name]: [name], minimally interactive, poor p.o. intake - poor QOL with decline as evidenced by severe cachexia;  -  DNR -palliative established goals of care and signed off 2/1     2) Advanced dementia with significant cognitive and memory deficits-encephalopathic -progressive decline-in setting of dementia   -Neurology consulted: Reviewing and evaluating for reversible causes-unlikely change in mentation at this point -Psych/CTS evaluation, adjusting medication - unlikely to reverse his condition at this point   -patient significant other [name]  recalls that the patient has had significant memory and cognitive decline over the last several years -Overall 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  He went on disability at that time -His cognitive and memory decline had progressed over the last few years... -The last time he was able to put a couple words together in a phrase was apparently around June 2023 when he said that the garage where he used to do his Dealer work was on [address] -otherwise over the last 12 months patient may answer with one-word answers, significant cognitive decline -Patient lost over 40 pounds of weight due to poor oral intake -Recount initiated on 1/11  3)Goals of care, counseling/discussion - Long conversation held with [name] on 09/10/2022 and again on 09/13/2022, September 19, 2022   -Ongoing discussion with palliative care team -Ethic committee consulted, ongoing discussion For committee and Ms. Dossie Arbour  -recommended trial of Dobbhoff feeding tube placement and tube feeding   -No improvement-minimally interactive, impulsive,-continue to pull out IVs telemetry lines -As needed Ativan, Haldol -No PO nutritional intake -IV fluid limited as he is pulling the line out   -Palliative care team, and ethics committee consulted: Recommended: Attempt Dobbhoff tube and tube feeds if fails progress to comfort care Recommended Neuro consultation   - patient has been hospitalized for days at that point and he has shown no signs of improvement.  He remains minimally interactive and impulsive, constantly trying to climb out of bed.  He has pulled IV out as well as telemetry off repetitively.   -He also has had extremely minimal nutritional intake since admission.    -Medical staff do not find patient to be a good candidate for G- tube placement as he will inevitably pull this out as well and PEG tube placement also considered medically futile and inappropriate in context of his multiple comorbidities, poor quality of life, and progressive functional decline   - Discussed with Sallye Ober that he does not appear to have the physical reserve to overcome the events that have led up to this hospitalization.  He may also not survive this hospitalization.       - [name] agrees with DNR status at this time. She does wish for ongoing attempts/treatment to see if he improves, but should he continue to decline (which I expect), then a natural death will be allowed. He is currently not a rehab candidate unless were to show tremendous improvement and rather he appears more appropriate for inpatient vs residential hospice depending on course of events over the next few days -Overall prognosis remains poor/grave -Family contact--   Deneise Lever [phone] -As per [name] .... No living family members.  -Palliative working on contacting Ms. Scales     4)Hypernatremia -resolved   5)Acute metabolic encephalopathy  in setting of baseline advanced/severe dementia with significant cognitive and memory deficits at baseline - Etiology attributed to probable lithium toxicity - CT head unremarkable for acute findings.  Chronic left frontal lobe infarct appreciated -MRI brain also negative for acute stroke -Patient is reported to have some altered mentation at baseline as well; per girlfriend,  he is mostly nonverbal at baseline----please see #2 above -Continue sitter -Continue assist of nursing staff -Trial of lactulose for elevated ammonia level -Continue folate repletion -Depakote as adjusted per behavioral health   6)Dysphagia -Multifactorial including lithium toxicity, psychiatric disorder refusing oral intake -First oral intake 09/25/2022   -Per ethic committee trial of Dobbhoff tube place and enteric feeding if it fails we will proceed with comfort care   - Recent EGD June 2023, cannot see results, but per GI had gastritis and Candida esophagitis - unfortunately no signs of improvement still - see GOC - evaluated by SLP - continue offering diet as able; poor candidate for NGT and PEG candidate; not appropriate for TPN (No clear goals for long-term solution) -Pulled out Dobbhoff tube    Leukocytosis -likely reactive-resolved -Chest x-ray on 09/17/2022 without acute findings -Get blood cultures, check UA -Hold off on antibiotics   Resolved prolonged QT interval - Initial QTc was prolonged Repeated 12 EKG with QTc 429 on 10/07/2022 Avoid QTc prolonging agents   Hyperlipidemia -discontinued statin, risk  outweighs the benefit for this patient   Bipolar 1 disorder ([place]) - Discontinued lithium, per psych recommendation on Cogentin, oral Depakote - haldol / ativan PRN - depakote level is therapeutic as of 2/2.    Contamination of blood culture-resolved as of 09/09/2022 - 1/4 bottles noted with GPC from 12/20 but apparently nothing seen on media per micro. Has remained afebrile with minimal  leukocytosis (considered reactive from other processes) - Will Repeat blood culture if becomes febrile    DVT prophylaxis: enoxaparin Code Status: Full Family Communication: Tried calling significant other with no response on multiple days Disposition Plan: TBD Status is: Inpatient Remains inpatient appropriate because: Need for ongoing close monitoring and IV medications.  Consultants:  Palliative Neurology Behavioral health Ethics TTS 1/19  Procedures:  None  Antimicrobials:  Anti-infectives (From admission, onward)    Start     Dose/Rate Route Frequency Ordered Stop   09/06/22 0900  voriconazole (VFEND) tablet 200 mg  Status:  Discontinued        200 mg Oral  Once 09/05/22 1941 09/07/22 0815       Objective: Vitals:   10/19/22 1300 10/19/22 2152 10/20/22 0531 10/20/22 0847  BP: 114/80 103/70 (!) 139/92 126/88  Pulse: 88 79 93 81  Resp: 16 18 18 17   Temp: 98.6 F (37 C) 98.4 F (36.9 C) 98.6 F (37 C) 98.3 F (36.8 C)  TempSrc: Oral Oral Oral Oral  SpO2: 94% 100%  100%  Weight:      Height:        Intake/Output Summary (Last 24 hours) at 10/21/2022 1149 Last data filed at 10/21/2022 2353 Gross per 24 hour  Intake 781 ml  Output 1650 ml  Net -869 ml   Filed Weights   10/12/22 0459 10/17/22 0608 10/18/22 0300  Weight: 52.6 kg 42.6 kg 48 kg    Examination:  General exam: Frail appearing no acute distress. Awake, alert cooperative. Pleasant.    Respiratory system: Clear to auscultation no wheezes or rales. Cardiovascular system: normal S1 & S2 heard, no M/R/G. Gastrointestinal system: Abdomen is soft, ND/NT, no HSM. Normal BS.  Central nervous system: no focal findings.  Extremities: No edema Skin: No significant lesions noted  Data Reviewed: I have personally reviewed following labs and imaging studies  CBC: Recent Labs  Lab 10/17/22 0511 10/20/22 0613  WBC 2.9* 3.5*  HGB 12.5* 11.8*  HCT 40.2 36.4*  MCV 77.0* 76.0*  PLT 222 614   Basic  Metabolic Panel: Recent Labs  Lab 10/17/22 0511  NA 139  K 4.2  CL 106  CO2 26  GLUCOSE 89  BUN 24*  CREATININE 0.88  CALCIUM 9.2  MG 2.1   GFR: Estimated Creatinine Clearance: 59.1 mL/min (by C-G formula based on SCr of 0.88 mg/dL). Liver Function Tests: No results for input(s): "AST", "ALT", "ALKPHOS", "BILITOT", "PROT", "ALBUMIN" in the last 168 hours.  No results for input(s): "LIPASE", "AMYLASE" in the last 168 hours. No results for input(s): "AMMONIA" in the last 168 hours.  Coagulation Profile: No results for input(s): "INR", "PROTIME" in the last 168 hours. Cardiac Enzymes: No results for input(s): "CKTOTAL", "CKMB", "CKMBINDEX", "TROPONINI" in the last 168 hours. BNP (last 3 results) No results for input(s): "PROBNP" in the last 8760 hours. HbA1C: No results for input(s): "HGBA1C" in the last 72 hours. CBG: Recent Labs  Lab 10/14/22 1202 10/15/22 1132 10/16/22 0809 10/16/22 1627 10/17/22 0518  GLUCAP 98 121* 85 96 89     Lipid Profile: No  results for input(s): "CHOL", "HDL", "LDLCALC", "TRIG", "CHOLHDL", "LDLDIRECT" in the last 72 hours. Thyroid Function Tests: No results for input(s): "TSH", "T4TOTAL", "FREET4", "T3FREE", "THYROIDAB" in the last 72 hours. Anemia Panel: No results for input(s): "VITAMINB12", "FOLATE", "FERRITIN", "TIBC", "IRON", "RETICCTPCT" in the last 72 hours. Sepsis Labs: No results for input(s): "PROCALCITON", "LATICACIDVEN" in the last 168 hours.  No results found for this or any previous visit (from the past 240 hour(s)).   Radiology Studies: No results found.   Scheduled Meds:  acetaminophen  500 mg Oral BID   divalproex  1,000 mg Oral QHS   divalproex  750 mg Oral Q12H   enoxaparin (LOVENOX) injection  40 mg Subcutaneous G25W   folic acid  1 mg Oral Daily   LORazepam  1 mg Oral BID   megestrol  200 mg Oral BID   nystatin   Topical BID   mouth rinse  15 mL Mouth Rinse 4 times per day   pantoprazole  40 mg Oral Daily    polyethylene glycol  17 g Oral BID   QUEtiapine  400 mg Oral BID    LOS: 45 days   Time spent: 35 minutes  Shagun Wordell Wynetta Emery, MD  [facility]  If 7PM-7AM, please contact night-coverage [email or web address] 10/21/2022, 11:49 AM

## 2022-10-22 DIAGNOSIS — G9341 Metabolic encephalopathy: Secondary | ICD-10-CM | POA: Diagnosis not present

## 2022-10-22 DIAGNOSIS — R627 Adult failure to thrive: Secondary | ICD-10-CM | POA: Diagnosis not present

## 2022-10-22 DIAGNOSIS — F03918 Unspecified dementia, unspecified severity, with other behavioral disturbance: Secondary | ICD-10-CM | POA: Diagnosis not present

## 2022-10-22 NOTE — Plan of Care (Signed)
  Problem: Nutrition: Goal: Dietary intake will improve Outcome: Progressing   Problem: Activity: Goal: Risk for activity intolerance will decrease Outcome: Progressing   Problem: Nutrition: Goal: Adequate nutrition will be maintained Outcome: Progressing

## 2022-10-22 NOTE — Plan of Care (Signed)
  Problem: Education: Goal: Knowledge of disease or condition will improve Outcome: Not Progressing Goal: Knowledge of secondary prevention will improve (MUST DOCUMENT ALL) Outcome: Not Progressing Goal: Knowledge of patient specific risk factors will improve Elta [place] N/A or DELETE if not current risk factor) Outcome: Not Progressing   Problem: Ischemic Stroke/TIA Tissue Perfusion: Goal: Complications of ischemic stroke/TIA will be minimized Outcome: Progressing   Problem: Coping: Goal: Will verbalize positive feelings about self Outcome: Not Progressing Goal: Will identify appropriate support needs Outcome: Not Progressing   Problem: Health Behavior/Discharge Planning: Goal: Ability to manage health-related needs will improve Outcome: Not Progressing Goal: Goals will be collaboratively established with patient/family Outcome: Not Progressing   Problem: Self-Care: Goal: Ability to participate in self-care as condition permits will improve Outcome: Not Progressing Goal: Verbalization of feelings and concerns over difficulty with self-care will improve Outcome: Not Progressing Goal: Ability to communicate needs accurately will improve Outcome: Not Progressing   Problem: Nutrition: Goal: Risk of aspiration will decrease Outcome: Not Progressing Goal: Dietary intake will improve Outcome: Not Progressing   Problem: Education: Goal: Knowledge of General Education information will improve Description: Including pain rating scale, medication(s)/side effects and non-pharmacologic comfort measures Outcome: Not Progressing   Problem: Health Behavior/Discharge Planning: Goal: Ability to manage health-related needs will improve Outcome: Not Progressing   Problem: Clinical Measurements: Goal: Ability to maintain clinical measurements within normal limits will improve Outcome: Not Progressing Goal: Will remain free from infection Outcome: Not Progressing Goal: Diagnostic test  results will improve Outcome: Not Progressing Goal: Respiratory complications will improve Outcome: Not Progressing Goal: Cardiovascular complication will be avoided Outcome: Not Progressing   Problem: Activity: Goal: Risk for activity intolerance will decrease Outcome: Not Progressing   Problem: Nutrition: Goal: Adequate nutrition will be maintained Outcome: Not Progressing   Problem: Coping: Goal: Level of anxiety will decrease Outcome: Not Progressing   Problem: Elimination: Goal: Will not experience complications related to bowel motility Outcome: Not Progressing Goal: Will not experience complications related to urinary retention Outcome: Not Progressing   Problem: Pain Managment: Goal: General experience of comfort will improve Outcome: Not Progressing   Problem: Safety: Goal: Ability to remain free from injury will improve Outcome: Not Progressing   Problem: Skin Integrity: Goal: Risk for impaired skin integrity will decrease Outcome: Not Progressing   Problem: Safety: Goal: Non-violent Restraint(s) Outcome: Not Progressing

## 2022-10-22 NOTE — Progress Notes (Signed)
PROGRESS NOTE    [name]  JOA:[id number] DOB: [birth year] DOA: 09/05/2022 PCP: [facility]   Brief Narrative:    [name] is a 62 yo male with PMH dementia, bipolar 1 disorder, HLD, HTN, CAD, CVA who presented with confusion and dysphagia.  With further workup he was also found to be agitated/impulsive, and unable to be redirected.  He required Haldol and Ativan.  His significant other was concerned about his inability to swallow and he was brought for further workup.  Because of the difficulty eating/dysphagia, he was initially initiated on a stroke workup.  However, further workup also was notable for an elevated lithium level.  Given his presenting symptoms, it was felt that they were attributed to lithium toxicity.  Poison control was called and patient was started on fluids and admitted.  Despite aggressive measures, he showed little improvement during hospitalization.  Given his poor physical reserve and declining functional status leading up to hospitalization, he appeared to be more consistent with failure to thrive.  Goals of care discussions were held with his significant other who is his main caretaker and decision maker.  He was made [name] and continued on medical treatment in the hopes for some improvement however his prognosis was made clear that it appears to be worsening daily.  Palliative continues to have ongoing discussions with the girlfriend and ethics is involved.    09/18/22 -Had face-to-face Conference with patient's girlfriend on 09/13/2022 -Patient girlfriend requested that I do Not call her to talk to her about patient's poor prognosis anymore, because it makes her sad.- -Patient's girlfriend is accusing the medical team of "prematurely giving up on patient" --She tells me that she is not ready to make any decisions about possible de-escalation of care until the middle of January at the earliest -oral intake remains very  poor -Hypoglycemia with blood sugar of 69 noted despite continuous IV dextrose drip-- -will increase IV dextrose rate -Overall prognosis remains poor given very poor oral intake due to persistent metabolic encephalopathy superimposed on advanced dementia   09/19/22 -Remain calm comfortable, remained calm, comfortable, sleepy -Refused to eat or to be fed   09/20/2022 -Agitated confused overnight, Ativan and Haldol was used overnight -Appreciate palliative care following, and consulted ethics committee Current recommendation is neuroconsultation, attempt of Dobbhoff placement and initiation of tube feeding Fails we will proceed with comfort care    09/21/2022 -Some, minimal interaction between him and the nursing staff noted this morning with some oral intake -Plan remains for Dobbhoff placement per ethics committee recommendations Nutrition to be consulted for tube feeds -Psych has been consulted for evaluation   09/22/2022 -Patient received Dobbhoff feeding tube placement by IR on 09/21/2022 -Nutrition has started tube feeding -Neurology has evaluated the patient-looking for reversible causes -Agitated overnight received Ativan around 3 AM Unresponsive non-interactive this a.m.   09/23/2022 -Patient was seen and examined -Agitated earlier per nursing staff -Dobbhoff tube still in place, tube feeds,     09/24/2022 -A bit more responsive to the nursing staff at bedside was encouraging oral intake -Drop-offs still present -tolerating tube feeds     09/25/2022 -Opens his eyes but does not follow command, nonverbal, noncommunicating -Per nursing staff agitated earlier this morning, has had p.o. intake for first time today -Dobbhoff feeding tube still in place   09/26/22 -Patient is nonverbal and appears to have pulled out feeding tube on 1/9 -Appears to tolerating some oral intake -Trial of lactulose today due to  elevated ammonia levels -Depakote recently increased by behavioral  health -Trial of Megace to see if this helps to improve appetite -Continue folate repletion -Appears to be having more of a failure to thrive picture and is appropriate for hospice  09/27/22 -Calorie count initiated to help see if pt meeting caloric needs -As per Neurology note, patient is an appropriate candidate for hospice. Since next of kin cannot be contacted at this point, attempts will be made to transfer to facility for hospice care.   09/28/22-09/29/22 -DSS evaluating for emergency guardianship at which point patient can be discharged once a facility has been obtained.  09/30/22-10/01/22 Patient has obtained emergency guardianship and is now awaiting placement.  Still requires sitter at bedside, plan to start Seroquel to assist with behavioral disturbances.  10/02/22-10/16/22: Seroquel dosing was gradually increased over time, however this was not helping.  TTS was contacted 1/19 and had made some recommendations to adjust his medications, however he still continued to have ongoing issues with behavioral disturbances requiring sitter at bedside.  TTS recontacted 1/26 with check on Depakote level which was noted to be subtherapeutic.  Currently working on increasing Depakote levels gradually to max dose of 3000 mg total daily due to max recommended dose of 60 mg/kg.  Thus far, he continues to require sitter.  Full legal guardianship by DSS cannot be obtained until court date of 2/7 at which point they could make the decision to make him full comfort care and sent to SNF with hospice.  In the meantime, trying to illuminate the need for sitter to see if transferring to SNF would be possible.  Following Depakote levels.   2/1-2/2: stable, waiting on disposition  Assessment & Plan:   Principal Problem:   Severe/Advanced Dementia with behavioral disturbance ([place]) Active Problems:   Bipolar 1 disorder ([place])   Acute metabolic encephalopathy   Dysphagia   FTT (failure to thrive) in  adult  Assessment and Plan:   1)Lithium Toxicity - Treated Lithium discontinued. Psychiatry following.  Lithium level 1.51 on admission -lithium level normalized and trending down -No change in mentation,-not interactive -Status post psych/TTS evaluation -appreciate tele-psych rec's; he has been taken off lithium and started on depakote; continue p.o. for now   - [name]: [name], minimally interactive, poor p.o. intake - poor QOL with decline as evidenced by severe cachexia;  -  [name] -palliative established goals of care and signed off 2/1     2) Advanced dementia with significant cognitive and memory deficits-encephalopathic -progressive decline-in setting of dementia   -Neurology consulted: Reviewing and evaluating for reversible causes-unlikely change in mentation at this point -Psych/CTS evaluation, adjusting medication - unlikely to reverse his condition at this point   -patient significant other [name]  recalls that the patient has had significant memory and cognitive decline over the last several years -Overall 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  He went on disability at that time -His cognitive and memory decline had progressed over the last few years... -The last time he was able to put a couple words together in a phrase was apparently around June 2023 when he said that the garage where he used to do his Dealer work was on [address] -otherwise over the last 12 months patient may answer with one-word answers, significant cognitive decline -Patient lost over 40 pounds of weight due to poor oral intake -Recount initiated on 1/11  3)Goals of care, counseling/discussion - Long conversation held with [name] on 09/10/2022 and again on 09/13/2022, September 19, 2022   -Ongoing discussion with palliative care team -Ethic committee consulted, ongoing discussion For committee and Ms. Dossie Arbour  -recommended trial of Dobbhoff feeding tube placement and tube feeding   -No improvement-minimally interactive, impulsive,-continue to pull out IVs telemetry lines -As needed Ativan, Haldol -No PO nutritional intake -IV fluid limited as he is pulling the line out   -Palliative care team, and ethics committee consulted: Recommended: Attempt Dobbhoff tube and tube feeds if fails progress to comfort care Recommended Neuro consultation   - patient has been hospitalized for days at that point and he has shown no signs of improvement.  He remains minimally interactive and impulsive, constantly trying to climb out of bed.  He has pulled IV out as well as telemetry off repetitively.   -He also has had extremely minimal nutritional intake since admission.    -Medical staff do not find patient to be a good candidate for G- tube placement as he will inevitably pull this out as well and PEG tube placement also considered medically futile and inappropriate in context of his multiple comorbidities, poor quality of life, and progressive functional decline   - Discussed with Sallye Ober that he does not appear to have the physical reserve to overcome the events that have led up to this hospitalization.  He may also not survive this hospitalization.      - [name] agrees with [name] status at this time. She does wish for ongoing attempts/treatment to see if he improves, but should he continue to decline (which I expect), then a natural death will be allowed. He is currently not a rehab candidate unless were to show tremendous improvement and rather he appears more appropriate for inpatient vs residential hospice depending on course of events over the next few days -Overall prognosis remains poor/grave -Family contact--   Deneise Lever [phone] -As per [name] .... No living family members.  -Palliative working on contacting Ms. Scales     4)Hypernatremia -resolved   5)Acute metabolic encephalopathy in  setting of baseline advanced/severe dementia with significant cognitive and memory deficits at baseline - Etiology attributed to probable lithium toxicity - CT head unremarkable for acute findings.  Chronic left frontal lobe infarct appreciated -MRI brain also negative for acute stroke -Patient is reported to have some altered mentation at baseline as well; per girlfriend,  he is mostly nonverbal at baseline----please see #2 above -Continue sitter -Continue assist of nursing staff -Trial of lactulose for elevated ammonia level -Continue folate repletion -Depakote as adjusted per behavioral health -Depakote level is therapeutic    6)Dysphagia -Multifactorial including lithium toxicity, psychiatric disorder refusing oral intake -First oral intake 09/25/2022   -Per ethic committee trial of Dobbhoff tube place and enteric feeding if it fails we will proceed with comfort care   - Recent EGD June 2023, cannot see results, but per GI had gastritis and Candida esophagitis - unfortunately no signs of improvement still - see GOC - evaluated by SLP - continue offering diet as able; poor candidate for NGT and PEG candidate; not appropriate for TPN (No clear goals for long-term solution) -Pulled out Dobbhoff tube    Leukocytosis -likely reactive-resolved -Chest x-ray on 09/17/2022 without acute findings -Get blood cultures, check UA -Hold off on antibiotics   Resolved prolonged QT interval - Initial QTc was prolonged Repeated 12 EKG with QTc 429 on 10/07/2022 Avoid QTc prolonging agents  Hyperlipidemia -discontinued statin, risk outweighs the benefit for this patient   Bipolar 1 disorder ([place]) - Discontinued lithium, per psych recommendation on Cogentin, oral Depakote - haldol / ativan PRN - depakote level is therapeutic as of 2/2.    Contamination of blood culture-resolved as of 09/09/2022 - 1/4 bottles noted with GPC from 12/20 but apparently nothing seen on media per micro. Has  remained afebrile with minimal leukocytosis (considered reactive from other processes) - Will Repeat blood culture if becomes febrile    DVT prophylaxis: enoxaparin Code Status: [name] Family Communication: attempted calling significant other with no response on multiple days Disposition Plan: TBD Status is: Inpatient Remains inpatient appropriate because: Need for ongoing close monitoring and IV medications.  Consultants:  Palliative Neurology Behavioral health Ethics TTS 1/19  Procedures:  None  Antimicrobials:  Anti-infectives (From admission, onward)    Start     Dose/Rate Route Frequency Ordered Stop   09/06/22 0900  voriconazole (VFEND) tablet 200 mg  Status:  Discontinued        200 mg Oral  Once 09/05/22 1941 09/07/22 0815       Objective: Vitals:   10/20/22 0531 10/20/22 0847 10/21/22 1404 10/22/22 0547  BP: (!) 139/92 126/88 (!) 141/103 136/74  Pulse: 93 81 89 89  Resp: 18 17 16 18   Temp: 98.6 F (37 C) 98.3 F (36.8 C) 97.7 F (36.5 C) (!) 97.3 F (36.3 C)  TempSrc: Oral Oral Axillary   SpO2:  100% 100%   Weight:    48.5 kg  Height:        Intake/Output Summary (Last 24 hours) at 10/22/2022 1159 Last data filed at 10/22/2022 0900 Gross per 24 hour  Intake 1206 ml  Output 1400 ml  Net -194 ml   Filed Weights   10/17/22 0608 10/18/22 0300 10/22/22 0547  Weight: 42.6 kg 48 kg 48.5 kg    Examination:  General exam: Frail appearing no acute distress. Awake, alert cooperative. Pleasant.  Confused. Speaking Repetitive sentences  Respiratory system: Clear to auscultation no wheezes or rales. Cardiovascular system: normal S1 & S2 heard, no M/R/G. Gastrointestinal system: Abdomen is soft, ND/NT, no HSM. Normal BS.  Central nervous system: no focal findings.  Extremities: No edema Skin: No significant lesions noted  Data Reviewed: I have personally reviewed following labs and imaging studies  CBC: Recent Labs  Lab 10/17/22 0511 10/20/22 0613  WBC  2.9* 3.5*  HGB 12.5* 11.8*  HCT 40.2 36.4*  MCV 77.0* 76.0*  PLT 222 509   Basic Metabolic Panel: Recent Labs  Lab 10/17/22 0511  NA 139  K 4.2  CL 106  CO2 26  GLUCOSE 89  BUN 24*  CREATININE 0.88  CALCIUM 9.2  MG 2.1   GFR: Estimated Creatinine Clearance: 59.7 mL/min (by C-G formula based on SCr of 0.88 mg/dL). Liver Function Tests: No results for input(s): "AST", "ALT", "ALKPHOS", "BILITOT", "PROT", "ALBUMIN" in the last 168 hours.  No results for input(s): "LIPASE", "AMYLASE" in the last 168 hours. No results for input(s): "AMMONIA" in the last 168 hours.  Coagulation Profile: No results for input(s): "INR", "PROTIME" in the last 168 hours. Cardiac Enzymes: No results for input(s): "CKTOTAL", "CKMB", "CKMBINDEX", "TROPONINI" in the last 168 hours. BNP (last 3 results) No results for input(s): "PROBNP" in the last 8760 hours. HbA1C: No results for input(s): "HGBA1C" in the last 72 hours. CBG: Recent Labs  Lab 10/16/22 0809 10/16/22 1627 10/17/22 0518  GLUCAP 85 96 89  Lipid Profile: No results for input(s): "CHOL", "HDL", "LDLCALC", "TRIG", "CHOLHDL", "LDLDIRECT" in the last 72 hours. Thyroid Function Tests: No results for input(s): "TSH", "T4TOTAL", "FREET4", "T3FREE", "THYROIDAB" in the last 72 hours. Anemia Panel: No results for input(s): "VITAMINB12", "FOLATE", "FERRITIN", "TIBC", "IRON", "RETICCTPCT" in the last 72 hours. Sepsis Labs: No results for input(s): "PROCALCITON", "LATICACIDVEN" in the last 168 hours.  No results found for this or any previous visit (from the past 240 hour(s)).   Radiology Studies: No results found.   Scheduled Meds:  acetaminophen  500 mg Oral BID   divalproex  1,000 mg Oral QHS   divalproex  750 mg Oral Q12H   enoxaparin (LOVENOX) injection  40 mg Subcutaneous Q24H   folic acid  1 mg Oral Daily   LORazepam  1 mg Oral BID   megestrol  200 mg Oral BID   nystatin   Topical BID   mouth rinse  15 mL Mouth Rinse 4  times per day   pantoprazole  40 mg Oral Daily   QUEtiapine  400 mg Oral BID    LOS: 46 days   Time spent: 30 minutes  Standley Dakins, MD  [facility]  If 7PM-7AM, please contact night-coverage [email or web address] 10/22/2022, 11:59 AM

## 2022-10-22 NOTE — TOC Progression Note (Signed)
Transition of Care [facility]) - Progression Note    Patient Details  Name: [name] MRN: [id number] Date of Birth: 01/27/1961  Transition of Care [facility]) CM/SW Contact  Shade Flood, LCSW Phone Number: 10/22/2022, 11:53 AM  Clinical Narrative:     TOC following. Awaiting guardianship hearing on 10/24/22. Debbie from [facility] was here to assess pt on Friday. No decision yet on whether they will offer a bed for him. TOC will continue to follow.  Expected Discharge Plan: [place] Barriers to Discharge: Continued Medical Work up  Expected Discharge Plan and Services In-house Referral: Clinical Social Work   Post Acute Care Choice: [place] Living arrangements for the past 2 months: [place] Determinants of Health (SDOH) Interventions SDOH Screenings   Tobacco Use: Medium Risk (09/06/2022)    Readmission Risk Interventions    09/07/2022   12:35 PM  Readmission Risk Prevention Plan  Medication Screening Complete  Transportation Screening Complete

## 2022-10-23 DIAGNOSIS — G9341 Metabolic encephalopathy: Secondary | ICD-10-CM | POA: Diagnosis not present

## 2022-10-23 DIAGNOSIS — F03918 Unspecified dementia, unspecified severity, with other behavioral disturbance: Secondary | ICD-10-CM | POA: Diagnosis not present

## 2022-10-23 DIAGNOSIS — R627 Adult failure to thrive: Secondary | ICD-10-CM | POA: Diagnosis not present

## 2022-10-23 NOTE — Progress Notes (Signed)
Bed linens changed. Pt repositioned and pericare performed. New malewick placed.

## 2022-10-23 NOTE — Plan of Care (Signed)
  Problem: Education: Goal: Knowledge of disease or condition will improve Outcome: Progressing   Problem: Self-Care: Goal: Ability to participate in self-care as condition permits will improve Outcome: Progressing   Problem: Self-Care: Goal: Ability to communicate needs accurately will improve Outcome: Progressing   Problem: Nutrition: Goal: Dietary intake will improve Outcome: Progressing   Problem: Activity: Goal: Risk for activity intolerance will decrease Outcome: Progressing   Problem: Nutrition: Goal: Adequate nutrition will be maintained Outcome: Progressing   Problem: Coping: Goal: Level of anxiety will decrease Outcome: Progressing   Problem: Elimination: Goal: Will not experience complications related to bowel motility Outcome: Progressing Goal: Will not experience complications related to urinary retention Outcome: Progressing   Problem: Pain Managment: Goal: General experience of comfort will improve Outcome: Progressing

## 2022-10-23 NOTE — Progress Notes (Signed)
PROGRESS NOTE    [name]  WEX:[id number] DOB: 1960-10-18 DOA: 09/05/2022 PCP: [facility]   Brief Narrative:    [name] is a 62 yo male with PMH dementia, bipolar 1 disorder, HLD, HTN, CAD, CVA who presented with confusion and dysphagia.  With further workup he was also found to be agitated/impulsive, and unable to be redirected.  He required Haldol and Ativan.  His significant other was concerned about his inability to swallow and he was brought for further workup.  Because of the difficulty eating/dysphagia, he was initially initiated on a stroke workup.  However, further workup also was notable for an elevated lithium level.  Given his presenting symptoms, it was felt that they were attributed to lithium toxicity.  Poison control was called and patient was started on fluids and admitted.  Despite aggressive measures, he showed little improvement during hospitalization.  Given his poor physical reserve and declining functional status leading up to hospitalization, he appeared to be more consistent with failure to thrive.  Goals of care discussions were held with his significant other who is his main caretaker and decision maker.  He was made DNR and continued on medical treatment in the hopes for some improvement however his prognosis was made clear that it appears to be worsening daily.  Palliative continues to have ongoing discussions with the girlfriend and ethics is involved.    09/18/22 -Had face-to-face Conference with patient's girlfriend on 09/13/2022 -Patient girlfriend requested that I do Not call her to talk to her about patient's poor prognosis anymore, because it makes her sad.- -Patient's girlfriend is accusing the medical team of "prematurely giving up on patient" --She tells me that she is not ready to make any decisions about possible de-escalation of care until the middle of January at the earliest -oral intake remains very  poor -Hypoglycemia with blood sugar of 69 noted despite continuous IV dextrose drip-- -will increase IV dextrose rate -Overall prognosis remains poor given very poor oral intake due to persistent metabolic encephalopathy superimposed on advanced dementia   09/19/22 -Remain calm comfortable, remained calm, comfortable, sleepy -Refused to eat or to be fed   09/20/2022 -Agitated confused overnight, Ativan and Haldol was used overnight -Appreciate palliative care following, and consulted ethics committee Current recommendation is neuroconsultation, attempt of Dobbhoff placement and initiation of tube feeding Fails we will proceed with comfort care    09/21/2022 -Some, minimal interaction between him and the nursing staff noted this morning with some oral intake -Plan remains for Dobbhoff placement per ethics committee recommendations Nutrition to be consulted for tube feeds -Psych has been consulted for evaluation   09/22/2022 -Patient received Dobbhoff feeding tube placement by IR on 09/21/2022 -Nutrition has started tube feeding -Neurology has evaluated the patient-looking for reversible causes -Agitated overnight received Ativan around 3 AM Unresponsive non-interactive this a.m.   09/23/2022 -Patient was seen and examined -Agitated earlier per nursing staff -Dobbhoff tube still in place, tube feeds,     09/24/2022 -A bit more responsive to the nursing staff at bedside was encouraging oral intake -Drop-offs still present -tolerating tube feeds     09/25/2022 -Opens his eyes but does not follow command, nonverbal, noncommunicating -Per nursing staff agitated earlier this morning, has had p.o. intake for first time today -Dobbhoff feeding tube still in place   09/26/22 -Patient is nonverbal and appears to have pulled out feeding tube on 1/9 -Appears to tolerating some oral intake -Trial of lactulose today due to  elevated ammonia levels -Depakote recently increased by behavioral  health -Trial of Megace to see if this helps to improve appetite -Continue folate repletion -Appears to be having more of a failure to thrive picture and is appropriate for hospice  09/27/22 -Calorie count initiated to help see if pt meeting caloric needs -As per Neurology note, patient is an appropriate candidate for hospice. Since next of kin cannot be contacted at this point, attempts will be made to transfer to facility for hospice care.   09/28/22-09/29/22 -DSS evaluating for emergency guardianship at which point patient can be discharged once a facility has been obtained.  09/30/22-10/01/22 Patient has obtained emergency guardianship and is now awaiting placement.  Still requires sitter at bedside, plan to start Seroquel to assist with behavioral disturbances.  10/02/22-10/16/22: Seroquel dosing was gradually increased over time, however this was not helping.  TTS was contacted 1/19 and had made some recommendations to adjust his medications, however he still continued to have ongoing issues with behavioral disturbances requiring sitter at bedside.  TTS recontacted 1/26 with check on Depakote level which was noted to be subtherapeutic.  Currently working on increasing Depakote levels gradually to max dose of 3000 mg total daily due to max recommended dose of 60 mg/kg.  Thus far, he continues to require sitter.  Full legal guardianship by DSS cannot be obtained until court date of 2/7 at which point they could make the decision to make him full comfort care and sent to SNF with hospice.  In the meantime, trying to illuminate the need for sitter to see if transferring to SNF would be possible.  Following Depakote levels.   2/1-10/23/2022: stable, still waiting on disposition; started scheduled lorazepam BID for fidgeting behavior; unable to discontinue sitter; no meaningful improvements in mentation; increased depakote dose; valproic acid level now therapeutic as of 2/2.    Assessment & Plan:    Principal Problem:   Severe/Advanced Dementia with behavioral disturbance (HCC) Active Problems:   Bipolar 1 disorder (HCC)   Acute metabolic encephalopathy   Dysphagia   FTT (failure to thrive) in adult  Assessment and Plan:   1)Lithium Toxicity - Treated Lithium discontinued. Psychiatry following.  Lithium level 1.51 on admission -lithium level normalized and trending down -No change in mentation,-not interactive -Status post psych/TTS evaluation -appreciate tele-psych rec's; he has been taken off lithium and started on depakote; continue p.o. for now  -[name]: Cachectic, minimally interactive, poor p.o. intake - poor QOL with decline as evidenced by severe cachexia;  -  DNR -palliative established goals of care and signed off 2/1    2) Advanced dementia with significant cognitive and memory deficits-encephalopathic -progressive decline-in setting of dementia   -Neurology consulted: Reviewing and evaluating for reversible causes-unlikely change in mentation at this point -Psych/CTS evaluation, adjusting medication - unlikely to reverse his condition at this point   -patient significant other [name]  recalls that the patient has had significant memory and cognitive decline over the last several years -Overall 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  He went on disability at that time -His cognitive and memory decline had progressed over the last few years... -The last time he was able to put a couple words together in a phrase was apparently around June 2023 when he said that the garage where he used to do his Dealer work was on [address] -otherwise over the last 12  months patient may answer with one-word answers, significant cognitive decline -Patient lost over 40 pounds of weight due to poor oral intake -Recount initiated on 1/11    3)Goals of care, counseling/discussion - Long conversation  held with [facility] on 09/10/2022 and again on 09/13/2022, September 19, 2022   -Ongoing discussion with palliative care team -Ethic committee consulted, ongoing discussion For committee and Ms. Dossie Arbour -recommended trial of Dobbhoff feeding tube placement and tube feeding   -No improvement-minimally interactive, impulsive,-continue to pull out IVs telemetry lines -As needed Ativan, Haldol -No PO nutritional intake -IV fluid limited as he is pulling the line out   -Palliative care team, and ethics committee consulted: Recommended: Attempt Dobbhoff tube and tube feeds if fails progress to comfort care Recommended Neuro consultation   - patient has been hospitalized for days at that point and he has shown no signs of improvement.  He remains minimally interactive and impulsive, constantly trying to climb out of bed.  He has pulled IV out as well as telemetry off repetitively.   -He also has had extremely minimal nutritional intake since admission.    -Medical staff do not find patient to be a good candidate for G- tube placement as he will inevitably pull this out as well and PEG tube placement also considered medically futile and inappropriate in context of his multiple comorbidities, poor quality of life, and progressive functional decline   - Discussed with Sallye Ober that he does not appear to have the physical reserve to overcome the events that have led up to this hospitalization.  He may also not survive this hospitalization.      - [name] agrees with DNR status at this time. She does wish for ongoing attempts/treatment to see if he improves, but should he continue to decline (which I expect), then a natural death will be allowed. He is currently not a rehab candidate unless were to show tremendous improvement and rather he appears more appropriate for inpatient vs residential hospice depending on course of events over the next few days -Overall prognosis remains poor/grave -Family contact--    Deneise Lever [phone] -As per [name] .... No living family members.  -Palliative working on contacting Ms. Scales     4)Hypernatremia -resolved   5)Acute metabolic encephalopathy in setting of baseline advanced/severe dementia with significant cognitive and memory deficits at baseline - Etiology attributed to probable lithium toxicity - CT head unremarkable for acute findings.  Chronic left frontal lobe infarct appreciated -MRI brain also negative for acute stroke -Patient is reported to have some altered mentation at baseline as well; per girlfriend,  he is mostly nonverbal at baseline----please see #2 above -Continue sitter -Continue assist of nursing staff -Trial of lactulose for elevated ammonia level -Continue folate repletion -Depakote as adjusted per behavioral health -Depakote level is therapeutic    6)Dysphagia -Multifactorial including lithium toxicity -First oral intake 09/25/2022   -Per ethic committee trial of Dobbhoff tube place and enteric feeding if it fails we will proceed with comfort care   - Recent EGD June 2023, cannot see results, but per GI had gastritis and Candida esophagitis - unfortunately no signs of improvement still - see GOC - evaluated by SLP - continue offering diet as able; poor candidate for NGT and PEG candidate; not appropriate for TPN (No clear goals for long-term solution) -Pulled out Dobbhoff tube    Leukocytosis -likely reactive-resolved -Chest x-ray on 09/17/2022 without acute findings -Get blood cultures, check UA -Hold off on antibiotics  Resolved prolonged QT interval - Initial QTc was prolonged Repeated 12 EKG with QTc 429 on 10/07/2022 Avoid QTc prolonging agents   Hyperlipidemia -discontinued statin, risk outweighs the benefit for this patient   Bipolar 1 disorder (HCC) - Discontinued lithium, per psych recommendation on Cogentin, oral Depakote - haldol / ativan PRN - depakote level is therapeutic as of  10/19/22.  - added scheduled BID lorazepam dosing    Contamination of blood culture-resolved as of 09/09/2022 - 1/4 bottles noted with GPC from 12/20 but apparently nothing seen on media per micro. Has remained afebrile with minimal leukocytosis (considered reactive from other processes) - could consider repeating blood culture if becomes febrile    DVT prophylaxis: enoxaparin Code Status: DNR Family Communication: attempted calling significant other with no response on multiple attempts Disposition Plan: TBD Status is: Inpatient Remains inpatient appropriate because: Need for ongoing close monitoring and IV medications.  Consultants:  Palliative Neurology Behavioral health Ethics TTS 1/19  Procedures:  None  Antimicrobials:  Anti-infectives (From admission, onward)    Start     Dose/Rate Route Frequency Ordered Stop   09/06/22 0900  voriconazole (VFEND) tablet 200 mg  Status:  Discontinued        200 mg Oral  Once 09/05/22 1941 09/07/22 0815       Objective: Vitals:   10/20/22 0531 10/20/22 0847 10/21/22 1404 10/22/22 0547  BP: (!) 139/92 126/88 (!) 141/103 136/74  Pulse: 93 81 89 89  Resp: 18 17 16 18   Temp: 98.6 F (37 C) 98.3 F (36.8 C) 97.7 F (36.5 C) (!) 97.3 F (36.3 C)  TempSrc: Oral Oral Axillary   SpO2:  100% 100%   Weight:    48.5 kg  Height:        Intake/Output Summary (Last 24 hours) at 10/23/2022 1303 Last data filed at 10/23/2022 0958 Gross per 24 hour  Intake 1441 ml  Output 850 ml  Net 591 ml   Filed Weights   10/17/22 0608 10/18/22 0300 10/22/22 0547  Weight: 42.6 kg 48 kg 48.5 kg    Examination:  General exam: Frail appearing no acute distress. Awake, alert cooperative. Pleasant.  Confused. Speaking Repetitive sentences and fidgeting all the time.   Respiratory system: Clear to auscultation no wheezes or rales. Cardiovascular system: normal S1 & S2 heard, no M/R/G. Gastrointestinal system: Abdomen is soft, ND/NT, no HSM. Normal BS.   Central nervous system: no focal findings.  Extremities: No edema Skin: No significant lesions noted  Data Reviewed: I have personally reviewed following labs and imaging studies  CBC: Recent Labs  Lab 10/17/22 0511 10/20/22 0613  WBC 2.9* 3.5*  HGB 12.5* 11.8*  HCT 40.2 36.4*  MCV 77.0* 76.0*  PLT 222 191   Basic Metabolic Panel: Recent Labs  Lab 10/17/22 0511  NA 139  K 4.2  CL 106  CO2 26  GLUCOSE 89  BUN 24*  CREATININE 0.88  CALCIUM 9.2  MG 2.1   GFR: Estimated Creatinine Clearance: 59.7 mL/min (by C-G formula based on SCr of 0.88 mg/dL). Liver Function Tests: No results for input(s): "AST", "ALT", "ALKPHOS", "BILITOT", "PROT", "ALBUMIN" in the last 168 hours.  No results for input(s): "LIPASE", "AMYLASE" in the last 168 hours. No results for input(s): "AMMONIA" in the last 168 hours.  Coagulation Profile: No results for input(s): "INR", "PROTIME" in the last 168 hours. Cardiac Enzymes: No results for input(s): "CKTOTAL", "CKMB", "CKMBINDEX", "TROPONINI" in the last 168 hours. BNP (last 3 results) No results for  input(s): "PROBNP" in the last 8760 hours. HbA1C: No results for input(s): "HGBA1C" in the last 72 hours. CBG: Recent Labs  Lab 10/16/22 1627 10/17/22 0518  GLUCAP 96 89     Lipid Profile: No results for input(s): "CHOL", "HDL", "LDLCALC", "TRIG", "CHOLHDL", "LDLDIRECT" in the last 72 hours. Thyroid Function Tests: No results for input(s): "TSH", "T4TOTAL", "FREET4", "T3FREE", "THYROIDAB" in the last 72 hours. Anemia Panel: No results for input(s): "VITAMINB12", "FOLATE", "FERRITIN", "TIBC", "IRON", "RETICCTPCT" in the last 72 hours. Sepsis Labs: No results for input(s): "PROCALCITON", "LATICACIDVEN" in the last 168 hours.  No results found for this or any previous visit (from the past 240 hour(s)).   Radiology Studies: No results found.   Scheduled Meds:  acetaminophen  500 mg Oral BID   divalproex  1,000 mg Oral QHS    divalproex  750 mg Oral Q12H   enoxaparin (LOVENOX) injection  40 mg Subcutaneous [id number]   folic acid  1 mg Oral Daily   LORazepam  1 mg Oral BID   megestrol  200 mg Oral BID   nystatin   Topical BID   mouth rinse  15 mL Mouth Rinse 4 times per day   pantoprazole  40 mg Oral Daily   QUEtiapine  400 mg Oral BID    LOS: 47 days   Time spent: 30 minutes  Irwin Brakeman, MD  [facility]  If 7PM-7AM, please contact night-coverage [email or web address] 10/23/2022, 1:03 PM

## 2022-10-24 DIAGNOSIS — R627 Adult failure to thrive: Secondary | ICD-10-CM | POA: Diagnosis not present

## 2022-10-24 DIAGNOSIS — F319 Bipolar disorder, unspecified: Secondary | ICD-10-CM | POA: Diagnosis not present

## 2022-10-24 DIAGNOSIS — G9341 Metabolic encephalopathy: Secondary | ICD-10-CM | POA: Diagnosis not present

## 2022-10-24 DIAGNOSIS — F03918 Unspecified dementia, unspecified severity, with other behavioral disturbance: Secondary | ICD-10-CM | POA: Diagnosis not present

## 2022-10-24 MED ORDER — THIAMINE MONONITRATE 100 MG PO TABS
100.0000 mg | ORAL_TABLET | Freq: Every day | ORAL | Status: DC
  Administered 2022-10-25 – 2022-10-27 (×3): 100 mg via ORAL
  Filled 2022-10-24 (×3): qty 1

## 2022-10-24 MED ORDER — LORAZEPAM 1 MG PO TABS
1.0000 mg | ORAL_TABLET | ORAL | Status: DC | PRN
  Administered 2022-10-25 – 2022-10-27 (×3): 1 mg via ORAL
  Filled 2022-10-24 (×3): qty 1

## 2022-10-24 NOTE — Progress Notes (Signed)
Patient has been alert but confused this shift.  Patient has been cooperative with small bouts of agitation.  Patient has mainly been compliant with staff and has exhibited a good appetite this shift.  Patient has had bowel movements and urinated adequately this shift.

## 2022-10-24 NOTE — Progress Notes (Signed)
Nutrition Follow up  DOCUMENTATION CODES:   Severe malnutrition in context of chronic illness  underweight  INTERVENTION:   Dysphagia 2 diet with nectar thick liquids  Nectar thick milk with meals  Assist with feeding all meals   NUTRITION DIAGNOSIS:   Severe Malnutrition related to chronic illness, acute illness (acute metabolic encephalopathy in setting of dementia, dysphagia) as evidenced by energy intake < or equal to 50% for > or equal to 5 days, severe fat depletion, severe muscle depletion, energy intake < 75% for > or equal to 1 month.  - stable with improved oral intake  GOAL:  Patient will meet greater than or equal to 90% of their needs (if feasible given his confusion) - not met  MONITOR:  PO intake, Labs, TF tolerance, Weight trends  ASSESSMENT: Patient is a 62 yo male with hx of dementia, HTN, CVA who presented  with confusion and dysphagia. Acute metabolic encephalopathy.   Palliative following. Patient referred to Hospice at [facility]. Patient unable to proceed with transition to Hospice until after court date for Guardianship on 10/24/22.   Patient assisted with meals and sitter is bedside. Per recent nursing documentation completing 75-100% of recent meals. Fed by staff all meals. No new recommendations.   Weights reviewed. Last weight 12/20-59.4 kg. Will request new weight for assessment. Current weight obtained 48.5 kg.   Medications: reviewed.      Latest Ref Rng & Units 10/17/2022    5:11 AM 10/13/2022    5:23 AM 10/08/2022    8:56 AM  BMP  Glucose 70 - 99 mg/dL 89  87  96   BUN 8 - 23 mg/dL 24  22  16    Creatinine 0.61 - 1.24 mg/dL 0.88  0.89  0.79   Sodium 135 - 145 mmol/L 139  139  133   Potassium 3.5 - 5.1 mmol/L 4.2  4.4  4.0   Chloride 98 - 111 mmol/L 106  107  104   CO2 22 - 32 mmol/L 26  24  22    Calcium 8.9 - 10.3 mg/dL 9.2  9.1  8.6         Diet Order:   Diet Order             DIET DYS 2 Room service appropriate? Yes; Fluid  consistency: Nectar Thick  Diet effective now                   EDUCATION NEEDS:  Not appropriate for education at this time  Skin:  Skin Assessment: Reviewed RN Assessment  Last BM:  2/6 medium   Height:   Ht Readings from Last 1 Encounters:  10/11/22 5\' 8"  (1.727 m)    Weight:   Wt Readings from Last 1 Encounters:  10/22/22 48.5 kg    Ideal Body Weight:   70 kg  BMI:  Body mass index is 16.26 kg/m.  Estimated Nutritional Needs:   Kcal:  1900-2100  Protein:  100-105 gr  Fluid:  1800 ml daily  Colman Cater MS,RD,CSG,LDN Contact: Shea Evans
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
## 2022-10-24 NOTE — TOC Progression Note (Signed)
Transition of Care [facility]) - Progression Note    Patient Details  Name: [name] MRN: [id number] Date of Birth: Sep 16, 1961  [facility]) CM/SW Contact  Boneta Lucks, RN Phone Number: 10/24/2022, 1:48 PM  Clinical Narrative:   Left [place] [facility] social worker a message to update TOC with court decision. TOC updated MD that sitter and mitts will need to be removed to get placement. Patient ate 100% of his meal today.   Expected Discharge Plan: [place] Barriers to Discharge: Continued Medical Work up  Expected Discharge Plan and Services In-house Referral: Clinical Social Work   Post Acute Care Choice: [place] Living arrangements for the past 2 months: [place]

## 2022-10-24 NOTE — Progress Notes (Signed)
PROGRESS NOTE  [name] GYB:[id number] DOB: 05/24/1961 DOA: 09/05/2022 PCP: [facility]  Brief History:   [name] is a 62 yo male with PMH dementia, bipolar 1 disorder, HLD, HTN, CAD, CVA who presented with confusion and dysphagia.  With further workup he was also found to be agitated/impulsive, and unable to be redirected.  He required Haldol and Ativan.  His significant other was concerned about his inability to swallow and he was brought for further workup.  Because of the difficulty eating/dysphagia, he was initially initiated on a stroke workup.  However, further workup also was notable for an elevated lithium level.  Given his presenting symptoms, it was felt that they were attributed to lithium toxicity.  Poison control was called and patient was started on fluids and admitted.  Despite aggressive measures, he showed little improvement during hospitalization.  Given his poor physical reserve and declining functional status leading up to hospitalization, he appeared to be more consistent with failure to thrive.  Goals of care discussions were held with his significant other who is his main caretaker and decision maker.  He was made DNR and continued on medical treatment in the hopes for some improvement however his prognosis was made clear that it appears to be worsening daily.  Palliative continues to have ongoing discussions with the girlfriend and ethics is involved.    09/18/22 -Had face-to-face Conference with patient's girlfriend on 09/13/2022 -Patient girlfriend requested that I do Not call her to talk to her about patient's poor prognosis anymore, because it makes her sad.- -Patient's girlfriend is accusing the medical team of "prematurely giving up on patient" --She tells me that she is not ready to make any decisions about possible de-escalation of care until the middle of January at the earliest -oral intake remains very  poor -Hypoglycemia with blood sugar of 69 noted despite continuous IV dextrose drip-- -will increase IV dextrose rate -Overall prognosis remains poor given very poor oral intake due to persistent metabolic encephalopathy superimposed on advanced dementia   09/19/22 -Remain calm comfortable, remained calm, comfortable, sleepy -Refused to eat or to be fed   09/20/2022 -Agitated confused overnight, Ativan and Haldol was used overnight -Appreciate palliative care following, and consulted ethics committee Current recommendation is neuroconsultation, attempt of Dobbhoff placement and initiation of tube feeding Fails we will proceed with comfort care    09/21/2022 -Some, minimal interaction between him and the nursing staff noted this morning with some oral intake -Plan remains for Dobbhoff placement per ethics committee recommendations Nutrition to be consulted for tube feeds -Psych has been consulted for evaluation   09/22/2022 -Patient received Dobbhoff feeding tube placement by IR on 09/21/2022 -Nutrition has started tube feeding -Neurology has evaluated the patient-looking for reversible causes -Agitated overnight received Ativan around 3 AM Unresponsive non-interactive this a.m.   09/23/2022 -Patient was seen and examined -Agitated earlier per nursing staff -Dobbhoff tube still in place, tube feeds,     09/24/2022 -A bit more responsive to the nursing staff at bedside was encouraging oral intake -Drop-offs still present -tolerating tube feeds     09/25/2022 -Opens his eyes but does not follow command, nonverbal, noncommunicating -Per nursing staff agitated earlier this morning, has had p.o. intake for first time today -Dobbhoff feeding tube still in place     09/26/22 -Patient is nonverbal and appears to have pulled out feeding tube on 1/9 -Appears to tolerating some oral intake -  Trial of lactulose today due to elevated ammonia levels -Depakote recently increased by behavioral  health -Trial of Megace to see if this helps to improve appetite -Continue folate repletion -Appears to be having more of a failure to thrive picture and is appropriate for hospice   09/27/22 -Calorie count initiated to help see if pt meeting caloric needs -As per Neurology note, patient is an appropriate candidate for hospice. Since next of kin cannot be contacted at this point, attempts will be made to transfer to facility for hospice care.    09/28/22-09/29/22 -DSS evaluating for emergency guardianship at which point patient can be discharged once a facility has been obtained.   09/30/22-10/01/22 Patient has obtained emergency guardianship and is now awaiting placement.  Still requires sitter at bedside, plan to start Seroquel to assist with behavioral disturbances.   10/02/22-10/16/22: Seroquel dosing was gradually increased over time, however this was not helping.  TTS was contacted 1/19 and had made some recommendations to adjust his medications, however he still continued to have ongoing issues with behavioral disturbances requiring sitter at bedside.  TTS recontacted 1/26 with check on Depakote level which was noted to be subtherapeutic.  Currently working on increasing Depakote levels gradually to max dose of 3000 mg total daily due to max recommended dose of 60 mg/kg.  Thus far, he continues to require sitter.  Full legal guardianship by DSS cannot be obtained until court date of 2/7 at which point they could make the decision to make him full comfort care and sent to SNF with hospice.  In the meantime, trying to illuminate the need for sitter to see if transferring to SNF would be possible.   Following Depakote levels.    2/1-10/23/2022: stable, still waiting on disposition; started scheduled lorazepam BID for fidgeting behavior; unable to discontinue sitter; no meaningful improvements in mentation; increased depakote dose; valproic acid level now therapeutic as of 2/2.       Assessment/Plan:   1)Lithium Toxicity - Treated Lithium discontinued. Psychiatry saw pt during admission--lithium discontinued  Lithium level 1.51 on admission -lithium level normalized and trending down -No change in mentation,-not interactive -Status post psych/TTS evaluation -appreciate tele-psych rec's; he has been taken off lithium and started on depakote, seroquel, ativan -[name]: Cachectic, minimally interactive, poor p.o. intake - poor QOL with decline as evidenced by severe cachexia;  -  DNR -palliative established goals of care and signed off 2/1    2) Advanced dementia with significant cognitive and memory deficits-encephalopathic -progressive decline-in setting of dementia   -Neurology consulted: Reviewing and evaluating for reversible causes-unlikely change in mentation at this point -Psych/CTS evaluation, adjusting medication - unlikely to reverse his condition at this point   -patient significant other [name]  recalls that the patient has had significant memory and cognitive decline over the last several years -Overall 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  He went on disability at that time -His cognitive and memory decline had progressed over the last few years... -The last time he was able to put a couple words together in a phrase was apparently around June 2023 when he said that the garage where he used to do his Dealer work was on [address] -otherwise over the last 12 months patient may answer with one-word answers, significant cognitive decline -Patient lost over 40 pounds of weight due to poor oral intake -since admission--pt answers with simple  yes/no most of the time    3)Goals of care, counseling/discussion - Long conversation held with Barbaraann Share on 09/10/2022 and again on 09/13/2022, September 19, 2022   -Ongoing discussion with palliative care team -Ethic committee  consulted, ongoing discussion For committee and Ms. Verner Chol -recommended trial of Dobbhoff feeding tube placement and tube feeding   -No improvement-minimally interactive, impulsive,-continue to pull out IVs telemetry lines -As needed Ativan, Haldol -No PO nutritional intake -IV fluid limited as he is pulling the line out   -Palliative care team, and ethics committee consulted: Recommended: Attempt Dobbhoff tube and tube feeds if fails progress to comfort care Recommended Neuro consultation which was placed>>felt pt was hospice candidate, but pt has been refused by 2 facilities   - patient has been hospitalized nearly 2 months at that point and he has shown no signs of improvement.  He remains minimally interactive and impulsive, constantly trying to climb out of bed.  He has pulled IV out as well as telemetry off repetitively.   -He pulled out his dobhoff   -Medical staff do not find patient to be a good candidate for G- tube placement as he will inevitably pull this out as well and PEG tube placement also considered medically futile and inappropriate in context of his multiple comorbidities, poor quality of life, and progressive functional decline   - Discussed with Barbaraann Share that he does not appear to have the physical reserve to overcome the events that have led up to this hospitalization.  He may also not survive this hospitalization.      - [name] agrees with DNR status at this time. She does wish for ongoing attempts/treatment to see if he improves, but should he continue to decline (which I expect), then a natural death will be allowed. He is currently not a rehab candidate unless were to show tremendous improvement and rather he appears more appropriate for inpatient vs residential hospice depending on course of events over the next few days -Overall prognosis remains poor/grave -Family contact--   Richardean Chimera [phone] -As per [name] .... No living family members.   -Palliative working on contacting Ms. Scales     4)Hypernatremia -resolved   5)Acute metabolic encephalopathy in setting of baseline advanced/severe dementia with significant cognitive and memory deficits at baseline - Etiology attributed to probable lithium toxicity initially - CT head unremarkable for acute findings.  Chronic left frontal lobe infarct appreciated -MRI brain also negative for acute stroke -Patient is reported to have some altered mentation at baseline as well; per girlfriend,  he is mostly nonverbal at baseline----please see #2 above -Continue sitter -Continue assist of nursing staff -Trial of lactulose for elevated ammonia level -Continue folate repletion -Depakote as adjusted per behavioral health -Depakote level is therapeutic    6)Dysphagia -Multifactorial including lithium toxicity -First oral intake 09/25/2022   -Per ethic committee trial of Dobbhoff tube place and enteric feeding if it fails we will proceed with comfort care   - Recent EGD June 2023, cannot see results, but per GI had gastritis and Candida esophagitis - unfortunately no signs of improvement still - see GOC - evaluated by SLP - continue offering diet as able; poor candidate for NGT and PEG candidate; not appropriate for TPN (No clear goals for long-term solution) -Pulled out Dobbhoff tube    Leukocytosis -likely reactive-resolved -Chest x-ray on 09/17/2022 without acute findings -Get blood cultures, check UA -Hold off on antibiotics   Resolved prolonged QT interval - Initial QTc was prolonged  Repeated 12 EKG with QTc 429 on 10/07/2022 Avoid QTc prolonging agents   Hyperlipidemia -discontinued statin, risk outweighs the benefit for this patient   Bipolar 1 disorder (HCC) - Discontinued lithium, per psych recommendation on Cogentin, oral Depakote - haldol / ativan PRN - depakote level is therapeutic as of 10/19/22.  - added scheduled BID lorazepam dosing    Contamination of blood  culture-resolved as of 09/09/2022 - 1/4 bottles noted with GPC from 12/20 but apparently nothing seen on media per micro. Has remained afebrile with minimal leukocytosis (considered reactive from other processes) - could consider repeating blood culture if becomes febrile     Family Communication: no  Family at bedside  Consultants:  palliative  Code Status:  DNR  DVT Prophylaxis:  [place] Lovenox   Procedures: As Listed in Progress Note Above  Antibiotics: None       Subjective: Pt denies cp, abd pain, sob.  Remainder ROS not possible due to encephalopathy  Objective: Vitals:   10/23/22 1310 10/23/22 2126 10/24/22 0705 10/24/22 1239  BP: 112/85 130/83 (!) 148/96 (!) 145/97  Pulse: 100 (!) 105 90 94  Resp: 16 14    Temp: 98 F (36.7 C) 97.6 F (36.4 C) 98.3 F (36.8 C) 97.6 F (36.4 C)  TempSrc: Axillary Oral Oral Oral  SpO2: 100% 100% 93% 100%  Weight:      Height:        Intake/Output Summary (Last 24 hours) at 10/24/2022 1509 Last data filed at 10/24/2022 1319 Gross per 24 hour  Intake 1020 ml  Output 1700 ml  Net -680 ml   Weight change:  Exam:  General:  Pt is alert,intermittently follows commands appropriately, not in acute distress HEENT: No icterus, No thrush, No neck mass, [place]/AT Cardiovascular: RRR, S1/S2, no rubs, no gallops Respiratory: bibasilar rales. No wheeze Abdomen: Soft/+BS, non tender, non distended, no guarding Extremities: No edema, No lymphangitis, No petechiae, No rashes, no synovitis   Data Reviewed: I have personally reviewed following labs and imaging studies Basic Metabolic Panel: No results for input(s): "NA", "K", "CL", "CO2", "GLUCOSE", "BUN", "CREATININE", "CALCIUM", "MG", "PHOS" in the last 168 hours. Liver Function Tests: No results for input(s): "AST", "ALT", "ALKPHOS", "BILITOT", "PROT", "ALBUMIN" in the last 168 hours. No results for input(s): "LIPASE", "AMYLASE" in the last 168 hours. No results for input(s): "AMMONIA"  in the last 168 hours. Coagulation Profile: No results for input(s): "INR", "PROTIME" in the last 168 hours. CBC: Recent Labs  Lab 10/20/22 0613  WBC 3.5*  HGB 11.8*  HCT 36.4*  MCV 76.0*  PLT 191   Cardiac Enzymes: No results for input(s): "CKTOTAL", "CKMB", "CKMBINDEX", "TROPONINI" in the last 168 hours. BNP: Invalid input(s): "POCBNP" CBG: No results for input(s): "GLUCAP" in the last 168 hours. HbA1C: No results for input(s): "HGBA1C" in the last 72 hours. Urine analysis:    Component Value Date/Time   COLORURINE YELLOW 09/21/2022 2310   APPEARANCEUR CLEAR 09/21/2022 2310   LABSPEC 1.008 09/21/2022 2310   PHURINE 6.0 09/21/2022 2310   GLUCOSEU NEGATIVE 09/21/2022 2310   HGBUR NEGATIVE 09/21/2022 2310   BILIRUBINUR NEGATIVE 09/21/2022 2310   BILIRUBINUR neg 08/18/2014 0946   KETONESUR NEGATIVE 09/21/2022 2310   PROTEINUR NEGATIVE 09/21/2022 2310   UROBILINOGEN 0.2 08/18/2014 0946   NITRITE NEGATIVE 09/21/2022 2310   LEUKOCYTESUR NEGATIVE 09/21/2022 2310   Sepsis Labs: @LABRCNTIP (procalcitonin:4,lacticidven:4) )No results found for this or any previous visit (from the past 240 hour(s)).   Scheduled Meds:  acetaminophen  500  mg Oral BID   divalproex  1,000 mg Oral QHS   divalproex  750 mg Oral Q12H   enoxaparin (LOVENOX) injection  40 mg Subcutaneous Z61W   folic acid  1 mg Oral Daily   LORazepam  1 mg Oral BID   megestrol  200 mg Oral BID   nystatin   Topical BID   mouth rinse  15 mL Mouth Rinse 4 times per day   pantoprazole  40 mg Oral Daily   QUEtiapine  400 mg Oral BID   Continuous Infusions:  Procedures/Studies: DG Abd 1 View  Result Date: 10/08/2022 CLINICAL DATA:  Constipation EXAM: ABDOMEN - 1 VIEW COMPARISON:  None Available. FINDINGS: Nonobstructive bowel gas pattern. Moderate volume stool throughout the colon and within the rectal vault. No free intraperitoneal gas. No organomegaly. Bilateral inguinal hernia repair with mesh. No acute bone  abnormality. IMPRESSION: 1. Moderate volume stool. Nonobstructive bowel gas pattern. Electronically Signed   By: Fidela Salisbury M.D.   On: 10/08/2022 01:39    Orson Eva, DO  [facility]  If 7PM-7AM, please contact night-coverage [email or web address] Password [email or web address] 10/24/2022, 3:09 PM   LOS: 48 days

## 2022-10-25 LAB — COMPREHENSIVE METABOLIC PANEL
ALT: 20 U/L (ref 0–44)
AST: 22 U/L (ref 15–41)
Albumin: 2.9 g/dL — ABNORMAL LOW (ref 3.5–5.0)
Alkaline Phosphatase: 53 U/L (ref 38–126)
Anion gap: 8 (ref 5–15)
BUN: 20 mg/dL (ref 8–23)
CO2: 27 mmol/L (ref 22–32)
Calcium: 9 mg/dL (ref 8.9–10.3)
Chloride: 103 mmol/L (ref 98–111)
Creatinine, Ser: 0.73 mg/dL (ref 0.61–1.24)
GFR, Estimated: >60 mL/min (ref >60)
Glucose, Bld: 80 mg/dL (ref 70–99)
Potassium: 4.4 mmol/L (ref 3.5–5.1)
Sodium: 138 mmol/L (ref 135–145)
Total Bilirubin: 0.1 mg/dL — ABNORMAL LOW (ref 0.3–1.2)
Total Protein: 6.5 g/dL (ref 6.5–8.1)

## 2022-10-25 LAB — CBC
HCT: 39.6 % (ref 39.0–52.0)
Hemoglobin: 12.2 g/dL — ABNORMAL LOW (ref 13.0–17.0)
MCH: 24.1 pg — ABNORMAL LOW (ref 26.0–34.0)
MCHC: 30.8 g/dL (ref 30.0–36.0)
MCV: 78.3 fL — ABNORMAL LOW (ref 80.0–100.0)
Platelets: 165 10*3/uL (ref 150–400)
RBC: 5.06 MIL/uL (ref 4.22–5.81)
RDW: 15.8 % — ABNORMAL HIGH (ref 11.5–15.5)
WBC: 3.2 10*3/uL — ABNORMAL LOW (ref 4.0–10.5)
nRBC: 0 % (ref 0.0–0.2)

## 2022-10-25 LAB — PHOSPHORUS: Phosphorus: 3.4 mg/dL (ref 2.5–4.6)

## 2022-10-25 LAB — CK: Total CK: 72 U/L (ref 49–397)

## 2022-10-25 LAB — MAGNESIUM: Magnesium: 1.8 mg/dL (ref 1.7–2.4)

## 2022-10-25 LAB — VITAMIN B12: Vitamin B-12: 643 pg/mL (ref 180–914)

## 2022-10-25 LAB — FOLATE: Folate: 14 ng/mL (ref >5.9)

## 2022-10-25 NOTE — Progress Notes (Signed)
PROGRESS NOTE  [name] GYB:[id number] DOB: 05/24/1961 DOA: 09/05/2022 PCP: [facility]  Brief History:   [name] is a 62 yo male with PMH dementia, bipolar 1 disorder, HLD, HTN, CAD, CVA who presented with confusion and dysphagia.  With further workup he was also found to be agitated/impulsive, and unable to be redirected.  He required Haldol and Ativan.  His significant other was concerned about his inability to swallow and he was brought for further workup.  Because of the difficulty eating/dysphagia, he was initially initiated on a stroke workup.  However, further workup also was notable for an elevated lithium level.  Given his presenting symptoms, it was felt that they were attributed to lithium toxicity.  Poison control was called and patient was started on fluids and admitted.  Despite aggressive measures, he showed little improvement during hospitalization.  Given his poor physical reserve and declining functional status leading up to hospitalization, he appeared to be more consistent with failure to thrive.  Goals of care discussions were held with his significant other who is his main caretaker and decision maker.  He was made DNR and continued on medical treatment in the hopes for some improvement however his prognosis was made clear that it appears to be worsening daily.  Palliative continues to have ongoing discussions with the girlfriend and ethics is involved.    09/18/22 -Had face-to-face Conference with patient's girlfriend on 09/13/2022 -Patient girlfriend requested that I do Not call her to talk to her about patient's poor prognosis anymore, because it makes her sad.- -Patient's girlfriend is accusing the medical team of "prematurely giving up on patient" --She tells me that she is not ready to make any decisions about possible de-escalation of care until the middle of January at the earliest -oral intake remains very  poor -Hypoglycemia with blood sugar of 69 noted despite continuous IV dextrose drip-- -will increase IV dextrose rate -Overall prognosis remains poor given very poor oral intake due to persistent metabolic encephalopathy superimposed on advanced dementia   09/19/22 -Remain calm comfortable, remained calm, comfortable, sleepy -Refused to eat or to be fed   09/20/2022 -Agitated confused overnight, Ativan and Haldol was used overnight -Appreciate palliative care following, and consulted ethics committee Current recommendation is neuroconsultation, attempt of Dobbhoff placement and initiation of tube feeding Fails we will proceed with comfort care    09/21/2022 -Some, minimal interaction between him and the nursing staff noted this morning with some oral intake -Plan remains for Dobbhoff placement per ethics committee recommendations Nutrition to be consulted for tube feeds -Psych has been consulted for evaluation   09/22/2022 -Patient received Dobbhoff feeding tube placement by IR on 09/21/2022 -Nutrition has started tube feeding -Neurology has evaluated the patient-looking for reversible causes -Agitated overnight received Ativan around 3 AM Unresponsive non-interactive this a.m.   09/23/2022 -Patient was seen and examined -Agitated earlier per nursing staff -Dobbhoff tube still in place, tube feeds,     09/24/2022 -A bit more responsive to the nursing staff at bedside was encouraging oral intake -Drop-offs still present -tolerating tube feeds     09/25/2022 -Opens his eyes but does not follow command, nonverbal, noncommunicating -Per nursing staff agitated earlier this morning, has had p.o. intake for first time today -Dobbhoff feeding tube still in place     09/26/22 -Patient is nonverbal and appears to have pulled out feeding tube on 1/9 -Appears to tolerating some oral intake -  Trial of lactulose today due to elevated ammonia levels -Depakote recently increased by behavioral  health -Trial of Megace to see if this helps to improve appetite -Continue folate repletion -Appears to be having more of a failure to thrive picture and is appropriate for hospice   09/27/22 -Calorie count initiated to help see if pt meeting caloric needs -As per Neurology note, patient is an appropriate candidate for hospice. Since next of kin cannot be contacted at this point, attempts will be made to transfer to facility for hospice care.    09/28/22-09/29/22 -DSS evaluating for emergency guardianship at which point patient can be discharged once a facility has been obtained.   09/30/22-10/01/22 Patient has obtained emergency guardianship and is now awaiting placement.  Still requires sitter at bedside, plan to start Seroquel to assist with behavioral disturbances.   10/02/22-10/16/22: Seroquel dosing was gradually increased over time, however this was not helping.  TTS was contacted 1/19 and had made some recommendations to adjust his medications, however he still continued to have ongoing issues with behavioral disturbances requiring sitter at bedside.  TTS recontacted 1/26 with check on Depakote level which was noted to be subtherapeutic.  Currently working on increasing Depakote levels gradually to max dose of 3000 mg total daily due to max recommended dose of 60 mg/kg.  Thus far, he continues to require sitter.  Full legal guardianship by DSS cannot be obtained until court date of 2/7 at which point they could make the decision to make him full comfort care and sent to SNF with hospice.  In the meantime, trying to illuminate the need for sitter to see if transferring to SNF would be possible.   Following Depakote levels.    2/1-10/23/2022: stable, still waiting on disposition; started scheduled lorazepam BID for fidgeting behavior; unable to discontinue sitter; no meaningful improvements in mentation; increased depakote dose; valproic acid level now therapeutic as of 2/2.    10/25/22--Patient's  significant other has been appointed his guardian.  She wishes to take him home with hospice.  TOC team helping with transition home.   Assessment/Plan:    1)Lithium Toxicity - Treated Lithium discontinued. Psychiatry saw pt during admission--lithium discontinued  Lithium level 1.51 on admission -lithium level normalized and trending down -No change in mentation,-not interactive -Status post psych/TTS evaluation -appreciate tele-psych rec's; he has been taken off lithium and started on depakote, seroquel, ativan -[name]: [name], minimally interactive, poor p.o. intake - poor QOL with decline as evidenced by severe cachexia;  -  DNR -palliative established goals of care and signed off 2/1    2) Advanced dementia with significant cognitive and memory deficits-encephalopathic -progressive decline-in setting of dementia   -Neurology consulted: Reviewing and evaluating for reversible causes-unlikely change in mentation at this point -Psych/CTS evaluation, adjusting medication - unlikely to reverse his condition at this point   -patient significant other Ms [name]  recalls that the patient has had significant memory and cognitive decline over the last several years -Overall 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  He went on disability at that time -His cognitive and memory decline had progressed over the last few years... -The last time he was able to put a couple words together in a phrase was apparently around June 2023 when he said that the garage where he used to do his Dealer work was on [address] -otherwise over the last 12 months patient  may answer with one-word answers, significant cognitive decline -Patient lost over 40 pounds of weight due to poor oral intake -since admission--pt answers with simple yes/no most of the time    3)Goals of care, counseling/discussion - Long conversation held with  Tyler Share on 09/10/2022 and again on 09/13/2022, September 19, 2022   -Ongoing discussion with palliative care team -Ethic committee consulted, ongoing discussion For committee and Ms. Verner Chol -recommended trial of Dobbhoff feeding tube placement and tube feeding   -No improvement-minimally interactive, impulsive,-continue to pull out IVs telemetry lines -As needed Ativan, Haldol -No PO nutritional intake -IV fluid limited as he is pulling the line out   -Palliative care team, and ethics committee consulted: Recommended: Attempt Dobbhoff tube and tube feeds if fails progress to comfort care Recommended Neuro consultation which was placed>>felt pt was hospice candidate, but pt has been refused by 2 facilities   - patient has been hospitalized nearly 2 months at that point and he has shown no signs of improvement.  He remains minimally interactive and impulsive, constantly trying to climb out of bed.  He has pulled IV out as well as telemetry off repetitively.   -He pulled out his dobhoff   -Medical staff do not find patient to be a good candidate for G- tube placement as he will inevitably pull this out as well and PEG tube placement also considered medically futile and inappropriate in context of his multiple comorbidities, poor quality of life, and progressive functional decline   - Discussed with Tyler Share that he does not appear to have the physical reserve to overcome the events that have led up to this hospitalization.  He may also not survive this hospitalization.      - [name] agrees with DNR status at this time. She does wish for ongoing attempts/treatment to see if he improves, but should he continue to decline (which I expect), then a natural death will be allowed. He is currently not a rehab candidate unless were to show tremendous improvement and rather he appears more appropriate for inpatient vs residential hospice depending on course of events over the next few days -Overall prognosis  remains poor/grave -Family contact--   [name] [phone] -As per Ms [name] .... No living family members.  -10/25/22--Ms. [name] appointed as guardian     4)Hypernatremia -resolved   5)Acute metabolic encephalopathy in setting of baseline advanced/severe dementia with significant cognitive and memory deficits at baseline - Etiology attributed to probable lithium toxicity initially - CT head unremarkable for acute findings.  Chronic left frontal lobe infarct appreciated -MRI brain also negative for acute stroke -Patient is reported to have some altered mentation at baseline as well; per girlfriend,  he is mostly nonverbal at baseline----please see #2 above -Continue sitter -Continue assist of nursing staff -elevated ammonia 41--due to depakote>>not clinically significant at this point -Continue folate repletion -Depakote as adjusted per behavioral health -Depakote level is therapeutic    6)Dysphagia -Multifactorial including lithium toxicity -First oral intake 09/25/2022   -Per ethic committee trial of Dobbhoff tube place and enteric feeding if it fails we will proceed with comfort care   - Recent EGD June 2023, cannot see results, but per GI had gastritis and Candida esophagitis - unfortunately no signs of improvement still - see GOC - evaluated by SLP>>dys 1 with thin - continue offering diet as able; poor candidate for NGT and PEG candidate; not appropriate for TPN (No clear goals for long-term solution) -Pulled out Dobbhoff tube  Leukocytosis -likely reactive-resolved -Chest x-ray on 09/17/2022 without acute findings -Get blood cultures, check UA--no pyuria -Hold off on antibiotics -remains afebrile and hemodynamically stable   Resolved prolonged QT interval - Initial QTc was prolonged Repeated 12 EKG with QTc 429 on 10/07/2022 Avoid QTc prolonging agents   Hyperlipidemia -discontinued statin, risk outweighs the benefit for this patient   Bipolar 1  disorder (HCC) - Discontinued lithium, per psych recommendation on Cogentin, oral Depakote - haldol / ativan PRN - depakote level is therapeutic as of 10/20/22.  - added scheduled BID lorazepam dosing    Contamination of blood culture-resolved as of 09/09/2022 - 1/4 bottles noted with GPC from 12/20 but apparently nothing seen on media per micro. Has remained afebrile with minimal leukocytosis (considered reactive from other processes) - could consider repeating blood culture if becomes febrile       Family Communication: no  Family at bedside   Consultants:  palliative   Code Status:  DNR   DVT Prophylaxis:  [place] Lovenox     Procedures: As Listed in Progress Note Above   Antibiotics: None        Subjective: Pt awake and alert.  ROS not obtainable.  Pt not answering questions today  Objective: Vitals:   10/24/22 1239 10/24/22 2144 10/25/22 0430 10/25/22 0554  BP: (!) 145/97 (!) 147/94 (!) 150/89   Pulse: 94 98 89   Resp:  20 20   Temp: 97.6 F (36.4 C) 98 F (36.7 C) 98 F (36.7 C)   TempSrc: Oral  Oral   SpO2: 100%  97%   Weight:    48.8 kg  Height:        Intake/Output Summary (Last 24 hours) at 10/25/2022 1732 Last data filed at 10/25/2022 1300 Gross per 24 hour  Intake 720 ml  Output 2350 ml  Net -1630 ml   Weight change:  Exam:  General:  Pt is alert, does not follow commands appropriately, not in acute distress HEENT: No icterus, No thrush, No neck mass, [place]/AT Cardiovascular: RRR, S1/S2, no rubs, no gallops Respiratory: bibasilar rales. No wheeze Abdomen: Soft/+BS, non tender, non distended, no guarding Extremities: No edema, No lymphangitis, No petechiae, No rashes, no synovitis   Data Reviewed: I have personally reviewed following labs and imaging studies Basic Metabolic Panel: Recent Labs  Lab 10/25/22 0522  NA 138  K 4.4  CL 103  CO2 27  GLUCOSE 80  BUN 20  CREATININE 0.73  CALCIUM 9.0  MG 1.8  PHOS 3.4   Liver Function  Tests: Recent Labs  Lab 10/25/22 0522  AST 22  ALT 20  ALKPHOS 53  BILITOT 0.1*  PROT 6.5  ALBUMIN 2.9*   No results for input(s): "LIPASE", "AMYLASE" in the last 168 hours. No results for input(s): "AMMONIA" in the last 168 hours. Coagulation Profile: No results for input(s): "INR", "PROTIME" in the last 168 hours. CBC: Recent Labs  Lab 10/20/22 0613 10/25/22 0522  WBC 3.5* 3.2*  HGB 11.8* 12.2*  HCT 36.4* 39.6  MCV 76.0* 78.3*  PLT 191 165   Cardiac Enzymes: Recent Labs  Lab 10/25/22 0522  CKTOTAL 72   BNP: Invalid input(s): "POCBNP" CBG: No results for input(s): "GLUCAP" in the last 168 hours. HbA1C: No results for input(s): "HGBA1C" in the last 72 hours. Urine analysis:    Component Value Date/Time   COLORURINE YELLOW 09/21/2022 2310   APPEARANCEUR CLEAR 09/21/2022 2310   LABSPEC 1.008 09/21/2022 2310   PHURINE 6.0 09/21/2022 2310  GLUCOSEU NEGATIVE 09/21/2022 2310   HGBUR NEGATIVE 09/21/2022 2310   BILIRUBINUR NEGATIVE 09/21/2022 2310   BILIRUBINUR neg 08/18/2014 0946   KETONESUR NEGATIVE 09/21/2022 2310   PROTEINUR NEGATIVE 09/21/2022 2310   UROBILINOGEN 0.2 08/18/2014 0946   NITRITE NEGATIVE 09/21/2022 2310   LEUKOCYTESUR NEGATIVE 09/21/2022 2310   Sepsis Labs: @LABRCNTIP (procalcitonin:4,lacticidven:4) )No results found for this or any previous visit (from the past 240 hour(s)).   Scheduled Meds:  acetaminophen  500 mg Oral BID   divalproex  1,000 mg Oral QHS   divalproex  750 mg Oral Q12H   enoxaparin (LOVENOX) injection  40 mg Subcutaneous Q25Z   folic acid  1 mg Oral Daily   LORazepam  1 mg Oral BID   megestrol  200 mg Oral BID   nystatin   Topical BID   mouth rinse  15 mL Mouth Rinse 4 times per day   pantoprazole  40 mg Oral Daily   QUEtiapine  400 mg Oral BID   thiamine  100 mg Oral Daily   Continuous Infusions:  Procedures/Studies: DG Abd 1 View  Result Date: 10/08/2022 CLINICAL DATA:  Constipation EXAM: ABDOMEN - 1 VIEW  COMPARISON:  None Available. FINDINGS: Nonobstructive bowel gas pattern. Moderate volume stool throughout the colon and within the rectal vault. No free intraperitoneal gas. No organomegaly. Bilateral inguinal hernia repair with mesh. No acute bone abnormality. IMPRESSION: 1. Moderate volume stool. Nonobstructive bowel gas pattern. Electronically Signed   By: Fidela Salisbury M.D.   On: 10/08/2022 01:39    Orson Eva, DO  [facility]  If 7PM-7AM, please contact night-coverage [email or web address] Password [facility] 10/25/2022, 5:32 PM   LOS: 49 days

## 2022-10-25 NOTE — Plan of Care (Signed)
  Problem: Health Behavior/Discharge Planning: Goal: Ability to manage health-related needs will improve Outcome: Not Progressing   Problem: Education: Goal: Knowledge of disease or condition will improve Outcome: Not Progressing

## 2022-10-25 NOTE — Plan of Care (Signed)
  Problem: Health Behavior/Discharge Planning: Goal: Ability to manage health-related needs will improve Outcome: Not Progressing   

## 2022-10-25 NOTE — TOC Progression Note (Signed)
Transition of Care [facility]) - Progression Note    Patient Details  Name: [name] MRN: [id number] Date of Birth: 29-Jan-1961  [facility]) CM/SW Contact  Boneta Lucks, RN Phone Number: 10/25/2022, 12:15 PM  Clinical Narrative:   Chance with DSS called to update on court case.  Barbaraann Share, patients girlfriend of 14 years, was made his [name]. CM called she wishes to take him home with hospice. She wants [facility].  CM explained Evlyn Clines is the new name and they will be calling her. She will take off work tomorrow and be here in the morning. She will have a Live in caregiver to assist with his care. She needs a hospital bed, wheel chair and bed side commode.  Referral sent to [facility]. CM called Amber to get a discharge plan together for Friday. She will also call [name]. Treatment team updated. TOC to follow for ETA of DME delivery.     Expected Discharge Plan: Home w Hospice Care Barriers to Discharge: Continued Medical Work up  Expected Discharge Plan and Services In-house Referral: Clinical Social Work   Post Acute Care Choice: [place] Living arrangements for the past 2 months: [place] Determinants of Health (SDOH) Interventions SDOH Screenings   Tobacco Use: Medium Risk (09/06/2022)    Readmission Risk Interventions    09/07/2022   12:35 PM  Readmission Risk Prevention Plan  Medication Screening Complete  Transportation Screening Complete

## 2022-10-26 DIAGNOSIS — F03918 Unspecified dementia, unspecified severity, with other behavioral disturbance: Secondary | ICD-10-CM | POA: Diagnosis not present

## 2022-10-26 DIAGNOSIS — G9341 Metabolic encephalopathy: Secondary | ICD-10-CM | POA: Diagnosis not present

## 2022-10-26 DIAGNOSIS — F319 Bipolar disorder, unspecified: Secondary | ICD-10-CM | POA: Diagnosis not present

## 2022-10-26 DIAGNOSIS — R627 Adult failure to thrive: Secondary | ICD-10-CM | POA: Diagnosis not present

## 2022-10-26 NOTE — Progress Notes (Signed)
Patient presented with some agitation last night, PRN ativan given, Patient rested remainder of the night.

## 2022-10-26 NOTE — Plan of Care (Signed)
  Problem: Nutrition: ?Goal: Dietary intake will improve ?Outcome: Progressing ?  ?

## 2022-10-26 NOTE — TOC Progression Note (Signed)
Transition of Care [facility]) - Progression Note    Patient Details  Name: [name] MRN: [id number] Date of Birth: November 09, 1960  Transition of Care [facility]) CM/SW [place], [place] Phone Number: 10/26/2022, 12:17 PM  Clinical Narrative:    CSW updated by Luetta Nutting with [facility] that she spoke to [place] who state they were updated by Barbaraann Share that she will be ready for DME tomorrow not today. CSW spoke to [place] who states they do not have an ETA for delivery tomorrow but it is scheduled. CSW spoke to [place] who states pt cannot arrive before the hospital bed and it will be delivered tomorrow. CSW explained that EMS will be called once hospital bed has been delivered. TOC to follow.   Expected Discharge Plan: Home w Hospice Care Barriers to Discharge: Continued Medical Work up  Expected Discharge Plan and Services In-house Referral: Clinical Social Work   Post Acute Care Choice: [place] Living arrangements for the past 2 months: [place] Determinants of Health (SDOH) Interventions SDOH Screenings   Tobacco Use: Medium Risk (09/06/2022)    Readmission Risk Interventions    09/07/2022   12:35 PM  Readmission Risk Prevention Plan  Medication Screening Complete  Transportation Screening Complete

## 2022-10-26 NOTE — Progress Notes (Signed)
PROGRESS NOTE  BYARD DUSTON [id number] DOB: April 17, 1961 DOA: 09/05/2022 PCP: [facility]  Brief History:  [name] is a 62 yo male with PMH dementia, bipolar 1 disorder, HLD, HTN, CAD, CVA who presented with confusion and dysphagia.  With further workup he was also found to be agitated/impulsive, and unable to be redirected.  He required Haldol and Ativan.  His significant other was concerned about his inability to swallow and he was brought for further workup.  Because of the difficulty eating/dysphagia, he was initially initiated on a stroke workup.  However, further workup also was notable for an elevated lithium level.  Given his presenting symptoms, it was felt that they were attributed to lithium toxicity.  Poison control was called and patient was started on fluids and admitted.  Despite aggressive measures, he showed little improvement during hospitalization.  Given his poor physical reserve and declining functional status leading up to hospitalization, he appeared to be more consistent with failure to thrive.  Goals of care discussions were held with his significant other who is his main caretaker and decision maker.  He was made DNR and continued on medical treatment in the hopes for some improvement however his prognosis was made clear that it appears to be worsening daily.  Palliative continues to have ongoing discussions with the girlfriend and ethics is involved.    09/18/22 -Had face-to-face Conference with patient's girlfriend on 09/13/2022 -Patient girlfriend requested that I do Not call her to talk to her about patient's poor prognosis anymore, because it makes her sad.- -Patient's girlfriend is accusing the medical team of "prematurely giving up on patient" --She tells me that she is not ready to make any decisions about possible de-escalation of care until the middle of January at the earliest -oral intake remains very  poor -Hypoglycemia with blood sugar of 69 noted despite continuous IV dextrose drip-- -will increase IV dextrose rate -Overall prognosis remains poor given very poor oral intake due to persistent metabolic encephalopathy superimposed on advanced dementia   09/19/22 -Remain calm comfortable, remained calm, comfortable, sleepy -Refused to eat or to be fed   09/20/2022 -Agitated confused overnight, Ativan and Haldol was used overnight -Appreciate palliative care following, and consulted ethics committee Current recommendation is neuroconsultation, attempt of Dobbhoff placement and initiation of tube feeding Fails we will proceed with comfort care    09/21/2022 -Some, minimal interaction between him and the nursing staff noted this morning with some oral intake -Plan remains for Dobbhoff placement per ethics committee recommendations Nutrition to be consulted for tube feeds -Psych has been consulted for evaluation   09/22/2022 -Patient received Dobbhoff feeding tube placement by IR on 09/21/2022 -Nutrition has started tube feeding -Neurology has evaluated the patient-looking for reversible causes -Agitated overnight received Ativan around 3 AM Unresponsive non-interactive this a.m.   09/23/2022 -Patient was seen and examined -Agitated earlier per nursing staff -Dobbhoff tube still in place, tube feeds,     09/24/2022 -A bit more responsive to the nursing staff at bedside was encouraging oral intake -Drop-offs still present -tolerating tube feeds     09/25/2022 -Opens his eyes but does not follow command, nonverbal, noncommunicating -Per nursing staff agitated earlier this morning, has had p.o. intake for first time today -Dobbhoff feeding tube still in place     09/26/22 -Patient is nonverbal and appears to have pulled out feeding tube on 1/9 -Appears to tolerating some oral intake -Trial  of lactulose today due to elevated ammonia levels -Depakote recently increased by behavioral  health -Trial of Megace to see if this helps to improve appetite -Continue folate repletion -Appears to be having more of a failure to thrive picture and is appropriate for hospice   09/27/22 -Calorie count initiated to help see if pt meeting caloric needs -As per Neurology note, patient is an appropriate candidate for hospice. Since next of kin cannot be contacted at this point, attempts will be made to transfer to facility for hospice care.    09/28/22-09/29/22 -DSS evaluating for emergency guardianship at which point patient can be discharged once a facility has been obtained.   09/30/22-10/01/22 Patient has obtained emergency guardianship and is now awaiting placement.  Still requires sitter at bedside, plan to start Seroquel to assist with behavioral disturbances.   10/02/22-10/16/22: Seroquel dosing was gradually increased over time, however this was not helping.  TTS was contacted 1/19 and had made some recommendations to adjust his medications, however he still continued to have ongoing issues with behavioral disturbances requiring sitter at bedside.  TTS recontacted 1/26 with check on Depakote level which was noted to be subtherapeutic.  Currently working on increasing Depakote levels gradually to max dose of 3000 mg total daily due to max recommended dose of 60 mg/kg.  Thus far, he continues to require sitter.  Full legal guardianship by DSS cannot be obtained until court date of 2/7 at which point they could make the decision to make him full comfort care and sent to SNF with hospice.  In the meantime, trying to illuminate the need for sitter to see if transferring to SNF would be possible.   Following Depakote levels.    2/1-10/23/2022: stable, still waiting on disposition; started scheduled lorazepam BID for fidgeting behavior; unable to discontinue sitter; no meaningful improvements in mentation; increased depakote dose; valproic acid level now therapeutic as of 2/2.     10/25/22--Patient's  significant other has been appointed his guardian.  She wishes to take him home with hospice.  TOC team helping with transition home.  10/26/22 -discharge delayed due to [facility] delaying delivery of DME -pt remains medically stable for d/c  Assessment/Plan:  1)Lithium Toxicity - Treated Lithium discontinued. Psychiatry saw pt during admission--lithium discontinued  Lithium level 1.51 on admission -lithium level normalized and trending down -No change in mentation,-not interactive -Status post psych/TTS evaluation -appreciate tele-psych rec's; he has been taken off lithium and started on depakote, seroquel, ativan -[name]: Cachectic, minimally interactive, poor p.o. intake - poor QOL with decline as evidenced by severe cachexia;  -  DNR -palliative established goals of care and signed off 2/1    2) Advanced dementia with significant cognitive and memory deficits-encephalopathic -progressive decline-in setting of dementia   -Neurology consulted: Reviewing and evaluating for reversible causes-unlikely change in mentation at this point -Psych/CTS evaluation, adjusting medication - unlikely to reverse his condition at this point   -patient significant other [name]  recalls that the patient has had significant memory and cognitive decline over the last several years -Overall 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  He went on disability at that time -His cognitive and memory decline had progressed over the last few years... -The last time he was able to put a couple words together in a phrase was apparently around June 2023 when he said that the garage where he used to  do his Dealer work was on [place] [address] -otherwise over the last 12 months patient may answer with one-word answers, significant cognitive decline -Patient lost over 40 pounds of weight due to poor oral intake -since admission--pt answers with  simple yes/no most of the time    3)Goals of care, counseling/discussion - Long conversation held with Clancy on 09/10/2022 and again on 09/13/2022, September 19, 2022   -Ongoing discussion with palliative care team -Ethic committee consulted, ongoing discussion For committee and Ms. Verner Chol -recommended trial of Dobbhoff feeding tube placement and tube feeding   -No improvement-minimally interactive, impulsive,-continue to pull out IVs telemetry lines -As needed Ativan, Haldol -No PO nutritional intake -IV fluid limited as he is pulling the line out   -Palliative care team, and ethics committee consulted: Recommended: Attempt Dobbhoff tube and tube feeds if fails progress to comfort care Recommended Neuro consultation which was placed>>felt pt was hospice candidate, but pt has been refused by 2 facilities   - patient has been hospitalized nearly 2 months at that point and he has shown no signs of improvement.  He remains minimally interactive and impulsive, constantly trying to climb out of bed.  He has pulled IV out as well as telemetry off repetitively.   -He pulled out his dobhoff   -Medical staff do not find patient to be a good candidate for G- tube placement as he will inevitably pull this out as well and PEG tube placement also considered medically futile and inappropriate in context of his multiple comorbidities, poor quality of life, and progressive functional decline   - Discussed with Barbaraann Share that he does not appear to have the physical reserve to overcome the events that have led up to this hospitalization.  He may also not survive this hospitalization.      - [name] agrees with DNR status at this time. She does wish for ongoing attempts/treatment to see if he improves, but should he continue to decline (which I expect), then a natural death will be allowed. He is currently not a rehab candidate unless were to show tremendous improvement and rather he appears more appropriate for  inpatient vs residential hospice depending on course of events over the next few days -Overall prognosis remains poor/grave -Family contact--   Richardean Chimera [phone] -As per [name] .... No living family members.  -10/25/22--Ms. Scales appointed as guardian     4)Hypernatremia -resolved   5)Acute metabolic encephalopathy in setting of baseline advanced/severe dementia with significant cognitive and memory deficits at baseline - Etiology attributed to probable lithium toxicity initially - CT head unremarkable for acute findings.  Chronic left frontal lobe infarct appreciated -MRI brain also negative for acute stroke -Patient is reported to have some altered mentation at baseline as well; per girlfriend,  he is mostly nonverbal at baseline----please see #2 above -Continue sitter -Continue assist of nursing staff -elevated ammonia 41--due to depakote>>not clinically significant at this point -Continue folate repletion -Depakote as adjusted per behavioral health -Depakote level is therapeutic    6)Dysphagia -Multifactorial including lithium toxicity -First oral intake 09/25/2022   -Per ethic committee trial of Dobbhoff tube place and enteric feeding if it fails we will proceed with comfort care   - Recent EGD June 2023, cannot see results, but per GI had gastritis and Candida esophagitis - unfortunately no signs of improvement still - see GOC - evaluated by SLP>>dys 1 with thin - continue offering diet as able; poor candidate for NGT and PEG candidate; not appropriate for  TPN (No clear goals for long-term solution) -Pulled out Dobbhoff tube    Leukocytosis -likely reactive-resolved -Chest x-ray on 09/17/2022 without acute findings -Get blood cultures, check UA--no pyuria -Hold off on antibiotics -remains afebrile and hemodynamically stable   Resolved prolonged QT interval - Initial QTc was prolonged Repeated 12 EKG with QTc 429 on 10/07/2022 Avoid QTc prolonging  agents   Hyperlipidemia -discontinued statin, risk outweighs the benefit for this patient   Bipolar 1 disorder ([place]) - Discontinued lithium, per psych recommendation on Cogentin, oral Depakote - haldol / ativan PRN - depakote level is therapeutic as of 10/20/22.  - added scheduled BID lorazepam dosing    Contamination of blood culture-resolved as of 09/09/2022 - 1/4 bottles noted with GPC from 12/20 but apparently nothing seen on media per micro. Has remained afebrile with minimal leukocytosis (considered reactive from other processes) - could consider repeating blood culture if becomes febrile       Family Communication: significant other updated 10/26/22   Consultants:  palliative   Code Status:  DNR   DVT Prophylaxis:  [place] Lovenox     Procedures: As Listed in Progress Note Above   Antibiotics: None              Subjective: ROS not possible.  Pt only intermittently speaks.  Denies pain, sob  Objective: Vitals:   10/25/22 2112 10/26/22 0350 10/26/22 0500 10/26/22 1535  BP: (!) 111/97 118/71  (!) 140/84  Pulse: 80 95  93  Resp: 20 18  16  $ Temp: (!) 97.5 F (36.4 C) 98.6 F (37 C)  (!) 97.5 F (36.4 C)  TempSrc: Axillary Oral  Oral  SpO2: 100% 100%    Weight:   48.4 kg   Height:        Intake/Output Summary (Last 24 hours) at 10/26/2022 1810 Last data filed at 10/26/2022 1700 Gross per 24 hour  Intake 1320 ml  Output 1650 ml  Net -330 ml   Weight change: -0.4 kg Exam:  General:  Pt is alert, does not follow commands appropriately, not in acute distress HEENT: No icterus, No thrush, No neck mass, [place]/AT Cardiovascular: RRR, S1/S2, no rubs, no gallops Respiratory: diminished BS;  bibasilar rales.  No wheeze Abdomen: Soft/+BS, non tender, non distended, no guarding Extremities: No edema, No lymphangitis, No petechiae, No rashes, no synovitis   Data Reviewed: I have personally reviewed following labs and imaging studies Basic Metabolic Panel: Recent  Labs  Lab 10/25/22 0522  NA 138  K 4.4  CL 103  CO2 27  GLUCOSE 80  BUN 20  CREATININE 0.73  CALCIUM 9.0  MG 1.8  PHOS 3.4   Liver Function Tests: Recent Labs  Lab 10/25/22 0522  AST 22  ALT 20  ALKPHOS 53  BILITOT 0.1*  PROT 6.5  ALBUMIN 2.9*   No results for input(s): "LIPASE", "AMYLASE" in the last 168 hours. No results for input(s): "AMMONIA" in the last 168 hours. Coagulation Profile: No results for input(s): "INR", "PROTIME" in the last 168 hours. CBC: Recent Labs  Lab 10/20/22 0613 10/25/22 0522  WBC 3.5* 3.2*  HGB 11.8* 12.2*  HCT 36.4* 39.6  MCV 76.0* 78.3*  PLT 191 165   Cardiac Enzymes: Recent Labs  Lab 10/25/22 0522  CKTOTAL 72   BNP: Invalid input(s): "POCBNP" CBG: No results for input(s): "GLUCAP" in the last 168 hours. HbA1C: No results for input(s): "HGBA1C" in the last 72 hours. Urine analysis:    Component Value Date/Time  COLORURINE YELLOW 09/21/2022 [place] 09/21/2022 2310   LABSPEC 1.008 09/21/2022 2310   PHURINE 6.0 09/21/2022 2310   GLUCOSEU NEGATIVE 09/21/2022 2310   HGBUR NEGATIVE 09/21/2022 2310   BILIRUBINUR NEGATIVE 09/21/2022 2310   BILIRUBINUR neg 08/18/2014 0946   KETONESUR NEGATIVE 09/21/2022 2310   PROTEINUR NEGATIVE 09/21/2022 2310   UROBILINOGEN 0.2 08/18/2014 0946   NITRITE NEGATIVE 09/21/2022 2310   LEUKOCYTESUR NEGATIVE 09/21/2022 2310   Sepsis Labs: @LABRCNTIP$ (procalcitonin:4,lacticidven:4) )No results found for this or any previous visit (from the past 240 hour(s)).   Scheduled Meds:  acetaminophen  500 mg Oral BID   divalproex  1,000 mg Oral QHS   divalproex  750 mg Oral Q12H   enoxaparin (LOVENOX) injection  40 mg Subcutaneous [id number]   folic acid  1 mg Oral Daily   LORazepam  1 mg Oral BID   megestrol  200 mg Oral BID   nystatin   Topical BID   mouth rinse  15 mL Mouth Rinse 4 times per day   pantoprazole  40 mg Oral Daily   QUEtiapine  400 mg Oral BID   thiamine  100 mg  Oral Daily   Continuous Infusions:  Procedures/Studies: DG Abd 1 View  Result Date: 10/08/2022 CLINICAL DATA:  Constipation EXAM: ABDOMEN - 1 VIEW COMPARISON:  None Available. FINDINGS: Nonobstructive bowel gas pattern. Moderate volume stool throughout the colon and within the rectal vault. No free intraperitoneal gas. No organomegaly. Bilateral inguinal hernia repair with mesh. No acute bone abnormality. IMPRESSION: 1. Moderate volume stool. Nonobstructive bowel gas pattern. Electronically Signed   By: Fidela Salisbury M.D.   On: 10/08/2022 01:39    Orson Eva, DO  [facility]  If 7PM-7AM, please contact night-coverage [email or web address] Password [email or web address] 10/26/2022, 6:10 PM   LOS: 50 days

## 2022-10-27 DIAGNOSIS — R627 Adult failure to thrive: Secondary | ICD-10-CM | POA: Diagnosis not present

## 2022-10-27 DIAGNOSIS — F03918 Unspecified dementia, unspecified severity, with other behavioral disturbance: Secondary | ICD-10-CM | POA: Diagnosis not present

## 2022-10-27 DIAGNOSIS — T56894A Toxic effect of other metals, undetermined, initial encounter: Secondary | ICD-10-CM | POA: Diagnosis not present

## 2022-10-27 MED ORDER — DIVALPROEX SODIUM 500 MG PO DR TAB: 1000.0000 mg | DELAYED_RELEASE_TABLET | Freq: Every day | ORAL | 1 refills | Status: AC

## 2022-10-27 MED ORDER — DIVALPROEX SODIUM 250 MG PO DR TAB: 750.0000 mg | DELAYED_RELEASE_TABLET | Freq: Two times a day (BID) | ORAL | 1 refills | Status: AC

## 2022-10-27 MED ORDER — MEGESTROL ACETATE 400 MG/10ML PO SUSP: 200.0000 mg | Freq: Two times a day (BID) | ORAL | 1 refills | Status: AC

## 2022-10-27 MED ORDER — QUETIAPINE FUMARATE 400 MG PO TABS: 400.0000 mg | ORAL_TABLET | Freq: Two times a day (BID) | ORAL | 1 refills | Status: AC

## 2022-10-27 MED ORDER — FOLIC ACID 1 MG PO TABS: 1.0000 mg | ORAL_TABLET | Freq: Every day | ORAL | Status: AC

## 2022-10-27 MED ORDER — LORAZEPAM 1 MG PO TABS: 1.0000 mg | ORAL_TABLET | Freq: Two times a day (BID) | ORAL | 0 refills | Status: AC

## 2022-10-27 NOTE — Plan of Care (Signed)
  Problem: Nutrition: ?Goal: Dietary intake will improve ?Outcome: Progressing ?  ?

## 2022-10-27 NOTE — TOC Transition Note (Signed)
Transition of Care [facility]) - CM/SW Discharge Note   Patient Details  Name: [name] MRN: [id number] Date of Birth: 1961/05/01  [facility] CM/SW Contact:  Iona Beard, [place] Phone Number: 10/27/2022, 12:42 PM  Clinical Narrative:    CSW received call from [place] who confirms DME has been delivered to home of legal guardian. CSW spoke to [employer] who confirms this and she is ready for pt to D/C to her home. CSW updated RN. Med necessity has been completed and sent to the floor. RN to call for EMS when ready. TOC signing off.   Final next level of care: Home w Hospice Care Barriers to Discharge: Barriers Resolved   Patient Goals and CMS Choice CMS Medicare.gov Compare Post Acute Care list provided to:: Legal Guardian Choice offered to / list presented to : [place] / [place]  Discharge Placement                         Discharge Plan and Services Additional resources added to the After Visit Summary for   In-house Referral: Clinical Social Work   Post Acute Care Choice: [place]                               Social Determinants of Health ([place]) Interventions SDOH Screenings   Tobacco Use: Medium Risk (09/06/2022)     Readmission Risk Interventions    09/07/2022   12:35 PM  Readmission Risk Prevention Plan  Medication Screening Complete  Transportation Screening Complete

## 2022-10-27 NOTE — Progress Notes (Signed)
Patient presented with anxiety during the night, PRN ativan given. Patient is resting calmly in bed now.

## 2022-10-27 NOTE — Discharge Summary (Signed)
Physician Discharge Summary   Patient: [name] MRN: [id number] DOB: [birth year]  Admit date:     09/05/2022  Discharge date: 10/27/22  Discharge Physician: Shanon Brow Maleah Rabago   PCP: [facility]   Recommendations at discharge:   Please follow up with primary care provider within 1-2 weeks  Please repeat BMP and CBC in one week      Hospital Course: [name] is a 62 yo male with PMH dementia, bipolar 1 disorder, HLD, HTN, CAD, CVA who presented with confusion and dysphagia.  With further workup he was also found to be agitated/impulsive, and unable to be redirected.  He required Haldol and Ativan.  His significant other was concerned about his inability to swallow and he was brought for further workup.  Because of the difficulty eating/dysphagia, he was initially initiated on a stroke workup.  However, further workup also was notable for an elevated lithium level.  Given his presenting symptoms, it was felt that they were attributed to lithium toxicity.  Poison control was called and patient was started on fluids and admitted.  Despite aggressive measures, he showed little improvement during hospitalization.  Given his poor physical reserve and declining functional status leading up to hospitalization, he appeared to be more consistent with failure to thrive.  Goals of care discussions were held with his significant other who is his main caretaker and decision maker.  He was made DNR and continued on medical treatment in the hopes for some improvement however his prognosis was made clear that it appears to be worsening daily.  Palliative continues to have ongoing discussions with the girlfriend and ethics is involved.    09/18/22 -Had face-to-face Conference with patient's girlfriend on 09/13/2022 -Patient girlfriend requested that I do Not call her to talk to her about patient's poor prognosis anymore, because it makes her sad.- -Patient's girlfriend is  accusing the medical team of "prematurely giving up on patient" --She tells me that she is not ready to make any decisions about possible de-escalation of care until the middle of January at the earliest -oral intake remains very poor -Hypoglycemia with blood sugar of 69 noted despite continuous IV dextrose drip-- -will increase IV dextrose rate -Overall prognosis remains poor given very poor oral intake due to persistent metabolic encephalopathy superimposed on advanced dementia   09/19/22 -Remain calm comfortable, remained calm, comfortable, sleepy -Refused to eat or to be fed   09/20/2022 -Agitated confused overnight, Ativan and Haldol was used overnight -Appreciate palliative care following, and consulted ethics committee Current recommendation is neuroconsultation, attempt of Dobbhoff placement and initiation of tube feeding Fails we will proceed with comfort care    09/21/2022 -Some, minimal interaction between him and the nursing staff noted this morning with some oral intake -Plan remains for Dobbhoff placement per ethics committee recommendations Nutrition to be consulted for tube feeds -Psych has been consulted for evaluation   09/22/2022 -Patient received Dobbhoff feeding tube placement by IR on 09/21/2022 -Nutrition has started tube feeding -Neurology has evaluated the patient-looking for reversible causes -Agitated overnight received Ativan around 3 AM Unresponsive non-interactive this a.m.   09/23/2022 -Patient was seen and examined -Agitated earlier per nursing staff -Dobbhoff tube still in place, tube feeds,     09/24/2022 -A bit more responsive to the nursing staff at bedside was encouraging oral intake -Drop-offs still present -tolerating tube feeds     09/25/2022 -Opens his eyes but does not follow command, nonverbal, noncommunicating -Per nursing staff agitated earlier  this morning, has had p.o. intake for first time today -Dobbhoff feeding tube still in place      09/26/22 -Patient is nonverbal and appears to have pulled out feeding tube on 1/9 -Appears to tolerating some oral intake -Trial of lactulose today due to elevated ammonia levels -Depakote recently increased by behavioral health -Trial of Megace to see if this helps to improve appetite -Continue folate repletion -Appears to be having more of a failure to thrive picture and is appropriate for hospice   09/27/22 -Calorie count initiated to help see if pt meeting caloric needs -As per Neurology note, patient is an appropriate candidate for hospice. Since next of kin cannot be contacted at this point, attempts will be made to transfer to facility for hospice care.    09/28/22-09/29/22 -DSS evaluating for emergency guardianship at which point patient can be discharged once a facility has been obtained.   09/30/22-10/01/22 Patient has obtained emergency guardianship and is now awaiting placement.  Still requires sitter at bedside, plan to start Seroquel to assist with behavioral disturbances.   10/02/22-10/16/22: Seroquel dosing was gradually increased over time, however this was not helping.  TTS was contacted 1/19 and had made some recommendations to adjust his medications, however he still continued to have ongoing issues with behavioral disturbances requiring sitter at bedside.  TTS recontacted 1/26 with check on Depakote level which was noted to be subtherapeutic.  Currently working on increasing Depakote levels gradually to max dose of 3000 mg total daily due to max recommended dose of 60 mg/kg.  Thus far, he continues to require sitter.  Full legal guardianship by DSS cannot be obtained until court date of 2/7 at which point they could make the decision to make him full comfort care and sent to SNF with hospice.  In the meantime, trying to illuminate the need for sitter to see if transferring to SNF would be possible.   Following Depakote levels.    2/1-10/23/2022: stable, still waiting on  disposition; started scheduled lorazepam BID for fidgeting behavior; unable to discontinue sitter; no meaningful improvements in mentation; increased depakote dose; valproic acid level now therapeutic as of 2/2.     10/25/22--Patient's significant other has been appointed his guardian.  She wishes to take him home with hospice.  TOC team helping with transition home.  She will take time off work to take care of him and has other family members of hers to help   10/26/22 -discharge delayed due to [facility] delaying delivery of DME -pt remains medically stable for d/c  10/27/22 -home hospice confirmed;  DME delivered. Pt remains medically stable for d/c  Assessment and Plan: 1)Lithium Toxicity - Treated Lithium discontinued. Psychiatry saw pt during admission--lithium discontinued  Lithium level 1.51 on admission -lithium level normalized and trending down -No change in mentation,-not interactive -Status post psych/TTS evaluation -appreciate tele-psych rec's; he has been taken off lithium and started on depakote, seroquel, ativan -[name]: Cachectic, minimally interactive, poor p.o. intake - poor QOL with decline as evidenced by severe cachexia;  -  DNR -palliative established goals of care and signed off 2/1    2) Advanced dementia with significant cognitive and memory deficits-encephalopathic -progressive decline-in setting of dementia   -Neurology consulted: Reviewing and evaluating for reversible causes-unlikely change in mentation at this point -Psych/CTS evaluation, adjusting medication - unlikely to reverse his condition at this point -with escalation of depakote, ativan, seroquel, pt less agitated and impulsive   -patient significant other [name]  recalls that  the patient has had significant memory and cognitive decline over the last several years -Overall 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  He  went on disability at that time -His cognitive and memory decline had progressed over the last few years... -The last time he was able to put a couple words together in a phrase was apparently around June 2023 when he said that the garage where he used to do his Dealer work was on [address] -otherwise over the last 12 months patient may answer with one-word answers, significant cognitive decline -Patient lost over 40 pounds of weight due to poor oral intake -since admission--pt answers with simple yes/no most of the time    3)Goals of care, counseling/discussion - Long conversation held with [place] on 09/10/2022 and again on 09/13/2022, September 19, 2022   -Ongoing discussion with palliative care team -Ethic committee consulted, ongoing discussion For committee and Ms. Verner Chol -recommended trial of Dobbhoff feeding tube placement and tube feeding   -No improvement-minimally interactive, impulsive,-continue to pull out IVs telemetry lines -As needed Ativan, Haldol -No PO nutritional intake -IV fluid limited as he is pulling the line out   -Palliative care team, and ethics committee consulted: Recommended: Attempt Dobbhoff tube and tube feeds if fails progress to comfort care Recommended Neuro consultation which was placed>>felt pt was hospice candidate, but pt has been refused by 2 facilities   - patient has been hospitalized nearly 2 months at that point and he has shown no signs of improvement.  He remains minimally interactive and impulsive, constantly trying to climb out of bed.  He has pulled IV out as well as telemetry off repetitively.   -He pulled out his dobhoff   -Medical staff do not find patient to be a good candidate for G- tube placement as he will inevitably pull this out as well and PEG tube placement also considered medically futile and inappropriate in context of his multiple comorbidities, poor quality of life, and progressive functional decline   - Discussed with Barbaraann Share that  he does not appear to have the physical reserve to overcome the events that have led up to this hospitalization.  He may also not survive this hospitalization.      - [name] agrees with DNR status at this time. She does wish for ongoing attempts/treatment to see if he improves, but should he continue to decline (which I expect), then a natural death will be allowed. He is currently not a rehab candidate unless were to show tremendous improvement and rather he appears more appropriate for inpatient vs residential hospice depending on course of events over the next few days -Overall prognosis remains poor -Family contact--   Richardean Chimera [phone] -As per [name] .... No living family members.  -10/25/22--Ms. Scales appointed as guardian     4)Hypernatremia -resolved   5)Acute metabolic encephalopathy in setting of baseline advanced/severe dementia with significant cognitive and memory deficits at baseline - Etiology attributed to probable lithium toxicity initially - CT head unremarkable for acute findings.  Chronic left frontal lobe infarct appreciated -MRI brain also negative for acute stroke -Patient is reported to have some altered mentation at baseline as well; per girlfriend,  he is mostly nonverbal at baseline----please see #2 above -Continue sitter -Continue assist of nursing staff -elevated ammonia 41--due to depakote>>not clinically significant at this point -Continue folate repletion -Depakote as adjusted per behavioral health -  Depakote level is therapeutic    6)Dysphagia -Multifactorial including lithium toxicity -First oral intake 09/25/2022   -Per ethic committee trial of Dobbhoff tube place and enteric feeding if it fails we will proceed with comfort care   - Recent EGD June 2023, cannot see results, but per GI had gastritis and Candida esophagitis - unfortunately no signs of improvement still - see GOC - evaluated by SLP>>dys 1 with thin - continue offering  diet as able; poor candidate for NGT and PEG candidate; not appropriate for TPN (No clear goals for long-term solution) -Pulled out Dobbhoff tube -he continued to eat well with assitance    Leukocytosis -likely reactive-resolved -Chest x-ray on 09/17/2022 without acute findings -Get blood cultures, check UA--no pyuria -Hold off on antibiotics -remains afebrile and hemodynamically stable   Resolved prolonged QT interval - Initial QTc was prolonged Repeated 12 EKG with QTc 429 on 10/07/2022 Avoid QTc prolonging agents   Hyperlipidemia -discontinued statin, risk outweighs the benefit for this patient   Bipolar 1 disorder ([place]) - Discontinued lithium, per psych recommendation on Cogentin, oral Depakote - haldol / ativan PRN - depakote level is therapeutic as of 10/20/22.  - added scheduled BID lorazepam dosing    Contamination of blood culture-resolved as of 09/09/2022 - 1/4 bottles noted with GPC from 12/20 but apparently nothing seen on media per micro. Has remained afebrile with minimal leukocytosis (considered reactive from other processes) - could consider repeating blood culture if becomes febrile           Consultants: palliative Procedures performed: none  Disposition: Home Diet recommendation:  Dysphagia type 1 thin Liquid DISCHARGE MEDICATION: Allergies as of 10/27/2022   No Known Allergies      Medication List     STOP taking these medications    atorvastatin 10 MG tablet Commonly known as: LIPITOR   lithium carbonate 300 MG capsule   traZODone 100 MG tablet Commonly known as: DESYREL       TAKE these medications    ascorbic acid 100 MG tablet Commonly known as: VITAMIN C Take by mouth.   aspirin EC 81 MG tablet Take 1 tablet (81 mg total) by mouth 2 (two) times daily.   Cholecalciferol 50 MCG (2000 UT) Tabs Take by mouth.   divalproex 500 MG DR tablet Commonly known as: DEPAKOTE Take 2 tablets (1,000 mg total) by mouth at bedtime.    divalproex 250 MG DR tablet Commonly known as: DEPAKOTE Take 3 tablets (750 mg total) by mouth 2 (two) times daily.   fluticasone 50 MCG/ACT nasal spray Commonly known as: FLONASE Place into the nose.   folic acid 1 MG tablet Commonly known as: FOLVITE Take 1 tablet (1 mg total) by mouth daily. Start taking on: October 28, 2022   LORazepam 1 MG tablet Commonly known as: ATIVAN Take 1 tablet (1 mg total) by mouth 2 (two) times daily.   megestrol 400 MG/10ML suspension Commonly known as: MEGACE Take 5 mLs (200 mg total) by mouth 2 (two) times daily.   QUEtiapine 400 MG tablet Commonly known as: SEROQUEL Take 1 tablet (400 mg total) by mouth 2 (two) times daily.   senna-docusate 8.6-50 MG tablet Commonly known as: Senokot S Take 1 tablet by mouth at bedtime as needed.        Discharge Exam: Filed Weights   10/25/22 0554 10/26/22 0500 10/27/22 [id number]  Weight: 48.8 kg 48.4 kg 50.7 kg   HEENT:  [place]/AT, No thrush, no icterus CV:  RRR, no  rub, no S3, no S4 Lung:  bibasilar rales. No wheeze Abd:  soft/+BS, NT Ext:  No edema, no lymphangitis, no synovitis, no rash   Condition at discharge: stable  The results of significant diagnostics from this hospitalization (including imaging, microbiology, ancillary and laboratory) are listed below for reference.   Imaging Studies: DG Abd 1 View  Result Date: 10/08/2022 CLINICAL DATA:  Constipation EXAM: ABDOMEN - 1 VIEW COMPARISON:  None Available. FINDINGS: Nonobstructive bowel gas pattern. Moderate volume stool throughout the colon and within the rectal vault. No free intraperitoneal gas. No organomegaly. Bilateral inguinal hernia repair with mesh. No acute bone abnormality. IMPRESSION: 1. Moderate volume stool. Nonobstructive bowel gas pattern. Electronically Signed   By: Fidela Salisbury M.D.   On: 10/08/2022 01:39    Microbiology: Results for orders placed or performed during the hospital encounter of 09/05/22  Blood culture  (routine x 2)     Status: None   Collection Time: 09/05/22  7:00 PM   Specimen: BLOOD  Result Value Ref Range Status   Specimen Description BLOOD LEFT ANTECUBITAL  Final   Special Requests   Final    BOTTLES DRAWN AEROBIC AND ANAEROBIC Blood Culture adequate volume   Culture   Final    NO GROWTH 5 DAYS Performed at [facility], [address], [place], [place] [address]    Report Status 09/10/2022 FINAL  Final  Blood culture (routine x 2)     Status: Abnormal   Collection Time: 09/05/22  7:00 PM   Specimen: BLOOD  Result Value Ref Range Status   Specimen Description   Final    BLOOD RIGHT ANTECUBITAL Performed at [facility], [address], [place], [place] [address]    Special Requests   Final    BOTTLES DRAWN AEROBIC AND ANAEROBIC Blood Culture results may not be optimal due to an excessive volume of blood received in culture bottles Performed at [place] [place], [place] [address]    Culture  Setup Time   Final    GRAM POSITIVE COCCI AEROBIC BOTTLE Gram Stain Report Called to,Read Back By and Verified With: Rachael Fee @0440$  09/06/22 BY SSLANE GRAM POSITIVE RODS ANAEROBIC BOTTLE ONLY Gram Stain Report Called to,Read Back By and Verified With: BLACKWELL @  1155 ON 122523 BY HENDERSON L CORRECTED RESULTS NO ORGANISMS SEEN AEROBIC BOTTLE ONLY PREVIOUSLY REPORTED AS: GRAM POSITIVE COCCI CORRECTED RESULTS CALLED TO: PHARMD E.MADUEME AT 1400 ON 09/06/2022 BY CM.    Culture (A)  Final    PROPIONIBACTERIUM ACNES Standardized susceptibility testing for this organism is not available. Performed at [facility], [place] [address], [place], [place] [address]    Report Status 09/12/2022 FINAL  Final  Resp panel by RT-PCR (RSV, Flu A&B, Covid) Anterior Nasal Swab     Status: None   Collection Time: 09/05/22  7:20 PM   Specimen: Anterior Nasal Swab  Result Value Ref Range Status   SARS Coronavirus 2 by RT PCR NEGATIVE NEGATIVE Final    Comment:  (NOTE) SARS-CoV-2 target nucleic acids are NOT DETECTED.  The SARS-CoV-2 RNA is generally detectable in upper respiratory specimens during the acute phase of infection. The lowest concentration of SARS-CoV-2 viral copies this assay can detect is 138 copies/mL. A negative result does not preclude SARS-Cov-2 infection and should not be used as the sole basis for treatment or other patient management decisions. A negative result may occur with  improper specimen collection/handling, submission of specimen other than nasopharyngeal swab,  presence of viral mutation(s) within the areas targeted by this assay, and inadequate number of viral copies(<138 copies/mL). A negative result must be combined with clinical observations, patient history, and epidemiological information. The expected result is Negative.  Fact Sheet for Patients:  EntrepreneurPulse.com.au  Fact Sheet for Healthcare Providers:  IncredibleEmployment.be  This test is no t yet approved or cleared by the [place] FDA and  has been authorized for detection and/or diagnosis of SARS-CoV-2 by FDA under an Emergency Use Authorization (EUA). This EUA will remain  in effect (meaning this test can be used) for the duration of the COVID-19 declaration under Section 564(b)(1) of the Act, 21 U.S.C.section 360bbb-3(b)(1), unless the authorization is terminated  or revoked sooner.       Influenza A by PCR NEGATIVE NEGATIVE Final   Influenza B by PCR NEGATIVE NEGATIVE Final    Comment: (NOTE) The Xpert Xpress SARS-CoV-2/FLU/RSV plus assay is intended as an aid in the diagnosis of influenza from Nasopharyngeal swab specimens and should not be used as a sole basis for treatment. Nasal washings and aspirates are unacceptable for Xpert Xpress SARS-CoV-2/FLU/RSV testing.  Fact Sheet for Patients: EntrepreneurPulse.com.au  Fact Sheet for Healthcare  Providers: IncredibleEmployment.be  This test is not yet approved or cleared by the [place] FDA and has been authorized for detection and/or diagnosis of SARS-CoV-2 by FDA under an Emergency Use Authorization (EUA). This EUA will remain in effect (meaning this test can be used) for the duration of the COVID-19 declaration under Section 564(b)(1) of the Act, 21 U.S.C. section 360bbb-3(b)(1), unless the authorization is terminated or revoked.     Resp Syncytial Virus by PCR NEGATIVE NEGATIVE Final    Comment: (NOTE) Fact Sheet for Patients: EntrepreneurPulse.com.au  Fact Sheet for Healthcare Providers: IncredibleEmployment.be  This test is not yet approved or cleared by the [place] FDA and has been authorized for detection and/or diagnosis of SARS-CoV-2 by FDA under an Emergency Use Authorization (EUA). This EUA will remain in effect (meaning this test can be used) for the duration of the COVID-19 declaration under Section 564(b)(1) of the Act, 21 U.S.C. section 360bbb-3(b)(1), unless the authorization is terminated or revoked.  Performed at [facility], [address], [place], [place] [address]   Urine Culture     Status: None   Collection Time: 09/06/22  4:30 AM   Specimen: In/Out Cath Urine  Result Value Ref Range Status   Specimen Description   Final    IN/OUT CATH URINE Performed at [facility], [address] [place], [place] [address]    Special Requests   Final    NONE Performed at [facility], [address] [place], [place] [address]    Culture   Final    NO GROWTH Performed at [facility], [place] [address], [place], [place] [address]    Report Status 09/07/2022 FINAL  Final    Labs: CBC: Recent Labs  Lab 10/25/22 0522  WBC 3.2*  HGB 12.2*  HCT 39.6  MCV 78.3*  PLT [id number]   Basic Metabolic Panel: Recent Labs  Lab 10/25/22 0522  NA 138  K 4.4  CL 103  CO2 27  GLUCOSE 80   BUN 20  CREATININE 0.73  CALCIUM 9.0  MG 1.8  PHOS 3.4   Liver Function Tests: Recent Labs  Lab 10/25/22 0522  AST 22  ALT 20  ALKPHOS 53  BILITOT 0.1*  PROT 6.5  ALBUMIN 2.9*   CBG: No results for input(s): "GLUCAP"  in the last 168 hours.  Discharge time spent: greater than 30 minutes.  Signed: Orson Eva, MD [facility] 10/27/2022

## 2022-10-28 LAB — GLUCOSE, CAPILLARY: Glucose-Capillary: 96 mg/dL (ref 70–99)

## 2022-10-28 NOTE — Progress Notes (Signed)
Patient picked up by [facility] to be transported home. Discharge packet sent with EMS.   Giavanni Zeitlin, Tivis Ringer, RN

## 2022-12-17 DEATH — deceased

## 2022-12-21 DIAGNOSIS — R4182 Altered mental status, unspecified: Secondary | ICD-10-CM

## 2023-01-16 ENCOUNTER — Encounter: Payer: Self-pay | Admitting: Internal Medicine
